# Patient Record
Sex: Male | Born: 1980
Health system: Southern US, Community
[De-identification: ages and names within clinical notes are randomized; demographics above are authoritative.]

## PROBLEM LIST (undated history)

## (undated) DIAGNOSIS — D649 Anemia, unspecified: Secondary | ICD-10-CM

## (undated) DIAGNOSIS — F419 Anxiety disorder, unspecified: Secondary | ICD-10-CM

## (undated) DIAGNOSIS — F32A Depression, unspecified: Secondary | ICD-10-CM

## (undated) DIAGNOSIS — F1020 Alcohol dependence, uncomplicated: Secondary | ICD-10-CM

## (undated) HISTORY — DX: Depression, unspecified: F32.A

## (undated) HISTORY — DX: Anemia, unspecified: D64.9

## (undated) HISTORY — DX: Alcohol dependence, uncomplicated: F10.20

## (undated) HISTORY — DX: Anxiety disorder, unspecified: F41.9

## (undated) HISTORY — PX: OTHER SURGICAL HISTORY: SHX169

---
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
## 2001-01-08 ENCOUNTER — Emergency Department (HOSPITAL_COMMUNITY): Admission: EM | Admit: 2001-01-08 | Discharge: 2001-01-08 | Payer: Self-pay | Admitting: Emergency Medicine

## 2002-11-21 ENCOUNTER — Emergency Department (HOSPITAL_COMMUNITY): Admission: EM | Admit: 2002-11-21 | Discharge: 2002-11-21 | Payer: Self-pay | Admitting: Emergency Medicine

## 2002-11-21 ENCOUNTER — Encounter: Payer: Self-pay | Admitting: Emergency Medicine

## 2004-04-19 ENCOUNTER — Emergency Department (HOSPITAL_COMMUNITY): Admission: EM | Admit: 2004-04-19 | Discharge: 2004-04-19 | Payer: Self-pay | Admitting: Emergency Medicine

## 2004-08-01 ENCOUNTER — Emergency Department (HOSPITAL_COMMUNITY): Admission: EM | Admit: 2004-08-01 | Discharge: 2004-08-01 | Payer: Self-pay | Admitting: Emergency Medicine

## 2004-09-05 ENCOUNTER — Emergency Department (HOSPITAL_COMMUNITY): Admission: EM | Admit: 2004-09-05 | Discharge: 2004-09-05 | Payer: Self-pay | Admitting: Emergency Medicine

## 2005-06-06 ENCOUNTER — Emergency Department (HOSPITAL_COMMUNITY): Admission: EM | Admit: 2005-06-06 | Discharge: 2005-06-06 | Payer: Self-pay | Admitting: Emergency Medicine

## 2005-06-30 ENCOUNTER — Emergency Department (HOSPITAL_COMMUNITY): Admission: EM | Admit: 2005-06-30 | Discharge: 2005-06-30 | Payer: Self-pay | Admitting: Emergency Medicine

## 2006-04-15 ENCOUNTER — Emergency Department (HOSPITAL_COMMUNITY): Admission: EM | Admit: 2006-04-15 | Discharge: 2006-04-15 | Payer: Self-pay | Admitting: Emergency Medicine

## 2006-06-05 ENCOUNTER — Emergency Department (HOSPITAL_COMMUNITY): Admission: EM | Admit: 2006-06-05 | Discharge: 2006-06-06 | Payer: Self-pay | Admitting: Emergency Medicine

## 2006-09-28 ENCOUNTER — Emergency Department (HOSPITAL_COMMUNITY): Admission: EM | Admit: 2006-09-28 | Discharge: 2006-09-28 | Payer: Self-pay | Admitting: Emergency Medicine

## 2007-01-08 ENCOUNTER — Emergency Department (HOSPITAL_COMMUNITY): Admission: EM | Admit: 2007-01-08 | Discharge: 2007-01-08 | Payer: Self-pay | Admitting: Emergency Medicine

## 2008-07-11 ENCOUNTER — Emergency Department (HOSPITAL_COMMUNITY): Admission: EM | Admit: 2008-07-11 | Discharge: 2008-07-11 | Payer: Self-pay | Admitting: Emergency Medicine

## 2008-07-29 ENCOUNTER — Other Ambulatory Visit: Payer: Self-pay | Admitting: Emergency Medicine

## 2008-07-29 ENCOUNTER — Ambulatory Visit: Payer: Self-pay | Admitting: Psychiatry

## 2008-07-29 ENCOUNTER — Inpatient Hospital Stay (HOSPITAL_COMMUNITY): Admission: AD | Admit: 2008-07-29 | Discharge: 2008-08-03 | Payer: Self-pay | Admitting: Psychiatry

## 2009-07-20 ENCOUNTER — Emergency Department (HOSPITAL_COMMUNITY): Admission: EM | Admit: 2009-07-20 | Discharge: 2009-07-20 | Payer: Self-pay | Admitting: Emergency Medicine

## 2009-07-25 ENCOUNTER — Emergency Department (HOSPITAL_COMMUNITY): Admission: EM | Admit: 2009-07-25 | Discharge: 2009-07-26 | Payer: Self-pay | Admitting: Emergency Medicine

## 2009-08-11 ENCOUNTER — Ambulatory Visit (HOSPITAL_COMMUNITY): Admission: RE | Admit: 2009-08-11 | Discharge: 2009-08-11 | Payer: Self-pay | Admitting: Orthopedic Surgery

## 2010-06-16 LAB — URINALYSIS, ROUTINE W REFLEX MICROSCOPIC
Ketones, ur: NEGATIVE mg/dL
Nitrite: NEGATIVE
Protein, ur: 30 mg/dL — AB
Specific Gravity, Urine: 1.024 (ref 1.005–1.030)
Urobilinogen, UA: 0.2 mg/dL (ref 0.0–1.0)
pH: 5.5 (ref 5.0–8.0)

## 2010-06-16 LAB — DIFFERENTIAL
Basophils Relative: 1 % (ref 0–1)
Eosinophils Absolute: 0.2 10*3/uL (ref 0.0–0.7)
Lymphocytes Relative: 22 % (ref 12–46)
Lymphs Abs: 1.1 10*3/uL (ref 0.7–4.0)
Monocytes Relative: 11 % (ref 3–12)

## 2010-06-16 LAB — TRICYCLICS SCREEN, URINE: TCA Scrn: NOT DETECTED

## 2010-06-16 LAB — CBC
Hemoglobin: 15.7 g/dL (ref 13.0–17.0)
MCV: 94.8 fL (ref 78.0–100.0)

## 2010-06-16 LAB — RAPID URINE DRUG SCREEN, HOSP PERFORMED
Amphetamines: NOT DETECTED
Barbiturates: NOT DETECTED
Benzodiazepines: NOT DETECTED
Cocaine: NOT DETECTED
Opiates: NOT DETECTED

## 2010-06-16 LAB — COMPREHENSIVE METABOLIC PANEL
ALT: 40 U/L (ref 0–53)
AST: 46 U/L — ABNORMAL HIGH (ref 0–37)
Albumin: 3.7 g/dL (ref 3.5–5.2)
Alkaline Phosphatase: 59 U/L (ref 39–117)
Calcium: 9.1 mg/dL (ref 8.4–10.5)
GFR calc non Af Amer: >60 mL/min (ref >60)
Glucose, Bld: 95 mg/dL (ref 70–99)
Total Protein: 7 g/dL (ref 6.0–8.3)

## 2010-06-16 LAB — URINE MICROSCOPIC-ADD ON

## 2010-06-16 LAB — ETHANOL: Alcohol, Ethyl (B): 186 mg/dL — ABNORMAL HIGH (ref 0–10)

## 2010-07-21 NOTE — H&P (Signed)
[name], [name]                  ACCOUNT NO.:  [id number]   MEDICAL RECORD NO.:  [id number]          PATIENT TYPE:  IPS   LOCATION:  0502                          [facility]ACILITY[facility]  [facility]   PHYSICIAN:  Anselm Jungling, MD  DATE OF BIRTH:  1981-02-24   DATE OF ADMISSION:  07/29/2008  DATE OF DISCHARGE:                       PSYCHIATRIC ADMISSION ASSESSMENT   A 30 year old male voluntarily admitted on Jul 29, 2008.   HISTORY OF PRESENT ILLNESS:  Patient is here as his parents were  concerned about him having suicidal and homicidal thoughts.  He states  his friend had called his parents because he was acting violently.  He  does state that he gets angry and feels out of control at times.  He  does state that he does have some stress but denied any suicidal  thoughts.  Parents had brought him to the emergency department for  further assessment.  He states he drinks on most days and sometimes it  is to excess.  He denies any other substance use.  He reports his sleep  and appetite have been satisfactory.   PAST PSYCHIATRIC HISTORY:  Patient states he has been here before but no  record was found.  Had seen Dr. Mila Homer approximately 1 month ago and  received a prescription for Prozac and Seroquel.  Patient never filled  the Prozac but has been taking his Seroquel to help him sleep.   SOCIAL HISTORY:  A 30 year old single male who lives in [place], he  lives alone.  He works as an Air traffic controller.  No legal issues.   FAMILY HISTORY:  None.   ALCOHOL AND DRUG HISTORY:  Patient smokes and again has been drinking  most days.  Denies any other substance use.  He denies any seizures,  blackouts, and no DUIs.   PRIMARY CARE Naiomy Watters:  Has none.   MEDICAL PROBLEMS:  Denies any acute or chronic health issues.   MEDICATIONS:  Again, had a prescription for Prozac and Seroquel, never  filled the prescription and has been taking Seroquel to help him sleep.   DRUG ALLERGIES:  NO KNOWN ALLERGIES.   PHYSICAL EXAM:  This is a young male fully assessed in the emergency  department.  Physical exam is reviewed with no significant findings.  He  offers no complaints today and appears in no distress.   LABORATORY DATA:  Shows a CBC within normal limits.  Urinalysis is  negative.  Urine drug screen is negative.  Acetaminophen level less than  10.  CMET within normal limits.  Alcohol level of 186.   MENTAL STATUS EXAM:  Patient is fully alert and cooperative.  He is  casually dressed.  He has fair eye contact.  His speech is clear.  Normal pace and tone.  Patient's mood is depressed and anxious.  He is  pleasant and calm and but does appear to have some mild anxiety.  Thought processes are coherent and goal directed.  No delusional  statements made.  Denies any suicidal or homicidal thoughts.  Cognitive  function, memory is intact.  Partial insight, willing to get some  help  and willing to call family for more information and support.   AXIS I:  1. Mood disorder, NOS.  2. Alcohol abuse, rule out dependence.  AXIS II:  Deferred.  AXIS III:  No known medical conditions.  AXIS IV:  Deferred at this time.  AXIS V:  Current is 35 to 40.   We will have Ativan available p.r.n. for anxiety or withdrawal symptoms.  We will contact parents for information.  We will have Seroquel  available on a p.r.n. basis for agitation and anxiety.  Case manager  will obtain followup with Dr. Mila Homer.   TENTATIVE LENGTH OF STAY:  At this time, is 2 to 3 days.      Landry Corporal, N.P.      Anselm Jungling, MD  Electronically Signed    JO/MEDQ  D:  07/30/2008  T:  07/30/2008  Job:  [id number]
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
## 2010-10-27 ENCOUNTER — Emergency Department (HOSPITAL_COMMUNITY): Payer: Medicaid Other

## 2010-10-27 ENCOUNTER — Emergency Department (HOSPITAL_COMMUNITY)
Admission: EM | Admit: 2010-10-27 | Discharge: 2010-10-27 | Disposition: A | Discharge status: Home / Self Care | Payer: Medicaid Other | Attending: Emergency Medicine | Admitting: Emergency Medicine

## 2010-10-27 DIAGNOSIS — S51809A Unspecified open wound of unspecified forearm, initial encounter: Secondary | ICD-10-CM | POA: Insufficient documentation

## 2010-10-27 DIAGNOSIS — S52309B Unspecified fracture of shaft of unspecified radius, initial encounter for open fracture type I or II: Secondary | ICD-10-CM | POA: Insufficient documentation

## 2010-10-27 DIAGNOSIS — F411 Generalized anxiety disorder: Secondary | ICD-10-CM | POA: Insufficient documentation

## 2010-10-27 DIAGNOSIS — R209 Unspecified disturbances of skin sensation: Secondary | ICD-10-CM | POA: Insufficient documentation

## 2010-10-27 DIAGNOSIS — Y92009 Unspecified place in unspecified non-institutional (private) residence as the place of occurrence of the external cause: Secondary | ICD-10-CM | POA: Insufficient documentation

## 2010-10-27 DIAGNOSIS — M79609 Pain in unspecified limb: Secondary | ICD-10-CM | POA: Insufficient documentation

## 2010-10-27 DIAGNOSIS — R29898 Other symptoms and signs involving the musculoskeletal system: Secondary | ICD-10-CM | POA: Insufficient documentation

## 2010-10-27 DIAGNOSIS — W268XXA Contact with other sharp object(s), not elsewhere classified, initial encounter: Secondary | ICD-10-CM | POA: Insufficient documentation

## 2010-10-27 LAB — CBC
MCHC: 35.8 g/dL (ref 30.0–36.0)
MCV: 88.4 fL (ref 78.0–100.0)
Platelets: 221 10*3/uL (ref 150–400)
RDW: 12.3 % (ref 11.5–15.5)

## 2010-10-27 LAB — DIFFERENTIAL
Basophils Absolute: 0 10*3/uL (ref 0.0–0.1)
Basophils Relative: 0 % (ref 0–1)
Lymphocytes Relative: 27 % (ref 12–46)
Monocytes Relative: 7 % (ref 3–12)
Neutro Abs: 4.4 10*3/uL (ref 1.7–7.7)
Neutrophils Relative %: 63 % (ref 43–77)

## 2010-10-27 LAB — POCT I-STAT, CHEM 8
Glucose, Bld: 92 mg/dL (ref 70–99)
Hemoglobin: 14.6 g/dL (ref 13.0–17.0)
Sodium: 142 mEq/L (ref 135–145)
TCO2: 21 mmol/L (ref 0–100)

## 2010-11-02 NOTE — Op Note (Signed)
NAMEKEITHAN, [name]                  ACCOUNT NO.:  [id number]  MEDICAL RECORD NO.:  [id number]  LOCATION:  [facility]                         FACILITY:  [facility]  PHYSICIAN:  Johnette Abraham, MD    DATE OF BIRTH:  11-09-1980  DATE OF PROCEDURE:  10/27/2010 DATE OF DISCHARGE:                              OPERATIVE REPORT   PREOPERATIVE DIAGNOSIS:  Complex laceration x2 of the right forearm; one on the extensor surface and one on the volar surface.  POSTOPERATIVE DIAGNOSIS:  Complex laceration x2 of the right forearm; one on the extensor surface and one on the volar surface.  PROCEDURES:  Exploration of complex wounds x2; one on the extensor surface and one on the volar surface, repair of extensor tendons, the extensor carpi ulnaris, the extensor digitorum, limited fasciotomies of both the volar forearm compartment and the dorsal forearm compartment, repair of the muscle and fascia of the volar flexor digitorum superficialis and layered wound closure totaling 11 cm.  SURGEON:  Johnette Abraham, MD  ASSISTANT:  None.  ANESTHESIA:  General.  FINDINGS:  Deep laceration more extensive on the extensor surface with laceration of the ECU and extensor digitorum tendon and muscle bellies. There was no obvious nerve injury, laceration through the volar forearm fascia with laceration of the FDS tendinous muscle belly.  ESTIMATED BLOOD LOSS:  25 mL.  INDICATIONS:  Mr. [name] is a 30 year old white male who was presented to the emergency department after shelving his hand through a glass window sustaining a complex laceration or several lacerations to his right upper extremity.  I was consulted for definitive repair.  On my exam, he had limited extension of his fingers.  Grossly, his neurologic exam was intact, although this was hampered by his degree of pain and the patient was inebriated at the time.  Risks, benefits and alternatives of surgery were discussed with the patient.  He agreed to  proceed with surgery. Consent was obtained.  PROCEDURE:  The patient was taken to the operating room and placed supine on the operating room table.  General anesthesia was administered without difficulty.  Time-out was performed.  Preoperative antibiotics had been given in the OR.  After removing the dressing, the wound began bleeding and therefore the arm was elevated and the tourniquet was inflated.  Afterwards, the extremity was prepped and draped in normal sterile fashion.  Exploration of the larger and on the dorsal aspect was performed first.  This laceration was actually quite deep.  After adequate hematoma was removed from wound, the wound was irrigated thoroughly with irrigation solution and evaluated.  The wound had to be enlarged both proximally and distally to gain adequate exposure for exploration.  The muscle bellies what appeared to be the ECU and extensor digitorum were completely lacerated, the bone was exposed.  No nerve injuries were evident.  Afterwards, the wrist was brought into extension approximating the musculofascial components of these muscle bellies.  These muscle bellies and tendons were closed with several interrupted figure-of-eight 3-0 Vicryl sutures.  The fascia was then also approximated with interrupted figure-of-eight 3-0 Vicryl sutures. To prevent a compartment syndrome and to provide some relief of pressure, a  limited fasciotomy was performed separate from the tendon repairs.  Interrupted 3-0 Vicryl was used to secure the subcutaneous layers nicely approximating the wound.  The skin itself was closed with staples.  Next, the volar wound was addressed.  Again, the incision had to be lengthened both proximally and distally to gain adequate exposure. Penetrated the deep forearm fascia, this fascia was opened with limited fasciotomy and the structures were explored.  This wound was through the deep fascia into the flexor digitorum superficialis; however,  did not track deep to the median nerve and ulnar nerves.  After irrigation of this wound, the fascia and muscle belly of the FDS were repaired with interrupted figure-of-eight 3-0 Vicryl.  The skin was closed with interrupted 3-0 Vicryl suture and then the skin was closed with staples. The tourniquet was released.  Hemostasis was achieved.  All fingers were returned to nice pink color.  Approximately 20 mL of 0.25% plain Marcaine was infiltrated circumferentially around the wound for postoperative pain control.  Xeroform dressing, a sterile dressing and a splint holding the wrist in slight extension was placed.  The patient tolerated the procedure well and was taken to the recovery room in stable condition.     Johnette Abraham, MD     HCC/MEDQ  D:  10/27/2010  T:  10/27/2010  Job:  [id number]  Electronically Signed by Knute Neu MD on 11/02/2010 10:12:54 AM

## 2011-07-20 ENCOUNTER — Emergency Department (HOSPITAL_COMMUNITY)
Admission: EM | Admit: 2011-07-20 | Discharge: 2011-07-20 | Disposition: A | Discharge status: Home / Self Care | Payer: Medicaid Other | Attending: Emergency Medicine | Admitting: Emergency Medicine

## 2011-07-20 ENCOUNTER — Encounter (HOSPITAL_COMMUNITY): Payer: Self-pay | Admitting: *Deleted

## 2011-07-20 DIAGNOSIS — S51809A Unspecified open wound of unspecified forearm, initial encounter: Secondary | ICD-10-CM | POA: Insufficient documentation

## 2011-07-20 DIAGNOSIS — W261XXA Contact with sword or dagger, initial encounter: Secondary | ICD-10-CM | POA: Insufficient documentation

## 2011-07-20 DIAGNOSIS — W260XXA Contact with knife, initial encounter: Secondary | ICD-10-CM | POA: Insufficient documentation

## 2011-07-20 DIAGNOSIS — F17200 Nicotine dependence, unspecified, uncomplicated: Secondary | ICD-10-CM | POA: Insufficient documentation

## 2011-07-20 DIAGNOSIS — S51811A Laceration without foreign body of right forearm, initial encounter: Secondary | ICD-10-CM

## 2011-07-20 MED ORDER — CEPHALEXIN 500 MG PO CAPS: 500.0000 mg | ORAL_CAPSULE | Freq: Four times a day (QID) | ORAL | Status: AC

## 2011-07-20 MED ORDER — IBUPROFEN 800 MG PO TABS: 800.0000 mg | ORAL_TABLET | Freq: Three times a day (TID) | ORAL | Status: AC | PRN

## 2011-07-20 NOTE — Discharge Instructions (Signed)
Laceration Care, Adult °A laceration is a cut that goes through all layers of the skin. The cut goes into the tissue beneath the skin. °HOME CARE °For stitches (sutures) or staples: °· Keep the cut clean and dry.  °· If you have a bandage (dressing), change it at least once a day. Change the bandage if it gets wet or dirty, or as told by your doctor.  °· Wash the cut with soap and water 2 times a day. Rinse the cut with water. Pat it dry with a clean towel.  °· Put a thin layer of medicated cream on the cut as told by your doctor.  °· You may shower after the first 24 hours. Do not soak the cut in water until the stitches are removed.  °· Only take medicines as told by your doctor.  °· Have your stitches or staples removed as told by your doctor.  °For skin adhesive strips: °· Keep the cut clean and dry.  °· Do not get the strips wet. You may take a bath, but be careful to keep the cut dry.  °· If the cut gets wet, pat it dry with a clean towel.  °· The strips will fall off on their own. Do not remove the strips that are still stuck to the cut.  °For wound glue: °· You may shower or take baths. Do not soak or scrub the cut. Do not swim. Avoid heavy sweating until the glue falls off on its own. After a shower or bath, pat the cut dry with a clean towel.  °· Do not put medicine on your cut until the glue falls off.  °· If you have a bandage, do not put tape over the glue.  °· Avoid lots of sunlight or tanning lamps until the glue falls off. Put sunscreen on the cut for the first year to reduce your scar.  °· The glue will fall off on its own. Do not pick at the glue.  °You may need a tetanus shot if: °· You cannot remember when you had your last tetanus shot.  °· You have never had a tetanus shot.  °If you need a tetanus shot and you choose not to have one, you may get tetanus. Sickness from tetanus can be serious. °GET HELP RIGHT AWAY IF:  °· Your pain does not get better with medicine.  °· Your arm, hand, leg, or  foot loses feeling (numbness) or changes color.  °· Your cut is bleeding.  °· Your joint feels weak, or you cannot use your joint.  °· You have painful lumps on your body.  °· Your cut is red, puffy (swollen), or painful.  °· You have a red line on the skin near the cut.  °· You have yellowish-white fluid (pus) coming from the cut.  °· You have a fever.  °· You have a bad smell coming from the cut or bandage.  °· Your cut breaks open before or after stitches are removed.  °· You notice something coming out of the cut, such as wood or glass.  °· You cannot move a finger or toe.  °MAKE SURE YOU:  °· Understand these instructions.  °· Will watch your condition.  °· Will get help right away if you are not doing well or get worse.  °Document Released: 08/11/2007 Document Revised: 02/11/2011 Document Reviewed: 08/18/2010 °[employer]® Patient Information ©2012 [employer]. °

## 2011-07-20 NOTE — ED Provider Notes (Signed)
Medical screening examination/treatment/procedure(s) were performed by non-physician practitioner and as supervising physician I was immediately available for consultation/collaboration.   Celene Kras, MD 07/20/11 [phone]

## 2011-07-20 NOTE — ED Notes (Signed)
Pt reports laceration to R lateral forearm last night by "filet knife." <1cm superficial lac, bleeding controlled. Last tetanus <6yrs ago.
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
## 2011-07-20 NOTE — ED Provider Notes (Signed)
History     CSN: [id number]  Arrival date & time 07/20/11  1311   First MD Initiated Contact with Patient 07/20/11 1508      Chief Complaint  Patient presents with  . Extremity Laceration    (Consider location/radiation/quality/duration/timing/severity/associated sxs/prior treatment) HPI  31 year-old male presents for evaluation of laceration.  Pt reports laceration to R forearm when accidentally cut with a "filet knife".  Sts he was washing dishes and left the filet knife pointing upward.  He accidentally jab R forearm into knife.  He proceeds to clean wound and went to sleep.  He is here for evaluation "just to be safe".  Denies numbness, pain with finger movement, or any other injury.  UTD with tetanus.    History reviewed. No pertinent past medical history.  Past Surgical History  Procedure Date  . Arm tendon surgery     No family history on file.  History  Substance Use Topics  . Smoking status: Current Everyday Smoker  . Smokeless tobacco: Not on file  . Alcohol Use: Yes     occasionally      Review of Systems  Constitutional: Negative for fever.  Skin: Negative for rash.  Neurological: Negative for numbness.    Allergies  Review of patient's allergies indicates no known allergies.  Home Medications   Current Outpatient Rx  Name Route Sig Dispense Refill  . ADULT MULTIVITAMIN W/MINERALS CH Oral Take 1 tablet by mouth daily.      BP 118/74  Pulse 84  Temp 98.4 F (36.9 C)  Resp 20  SpO2 96%  Physical Exam  Constitutional: He is oriented to person, place, and time. He appears well-developed and well-nourished. No distress.  HENT:  Head: Normocephalic.  Eyes: Conjunctivae are normal.  Neck: Neck supple.  Musculoskeletal: Normal range of motion.  Neurological: He is alert and oriented to person, place, and time.  Skin: Skin is warm. No rash noted.       ED Course  Procedures (including critical care time)  Labs Reviewed - No data to  display No results found.   No diagnosis found.    MDM  1cm laceration to R lateral forearm that has scabbed.  Wound duration >6 hrs.  No fb seen or palpated.  Will prophylactically provide abx to decreased risk of infection.  Strict f/u precaution given.  Pt voice understanding.  No neurovascular involvement noted on exam.          Fayrene Helper, PA-C 07/20/11 1521

## 2012-04-21 IMAGING — CR DG FOREARM 2V*R*
2 series · 2 of 2 positions shown · non-contrast
Comparison: Right hand radiographs performed 07/11/2008

CLINICAL DATA: Laceration to right forearm from glass door; right
forearm pain.

RIGHT FOREARM - 2 VIEW

[x forearm ap right]
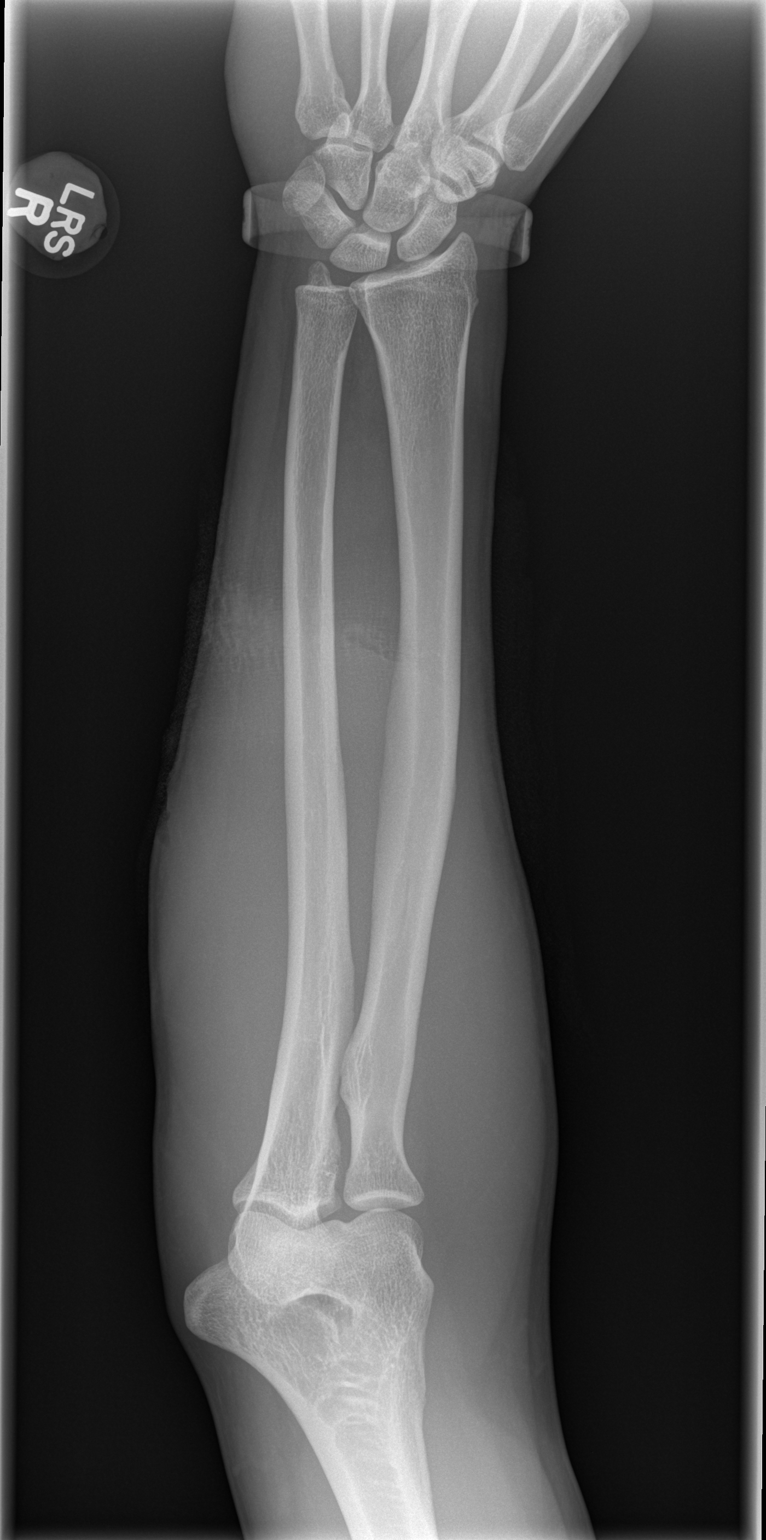

[x forearm lat right]
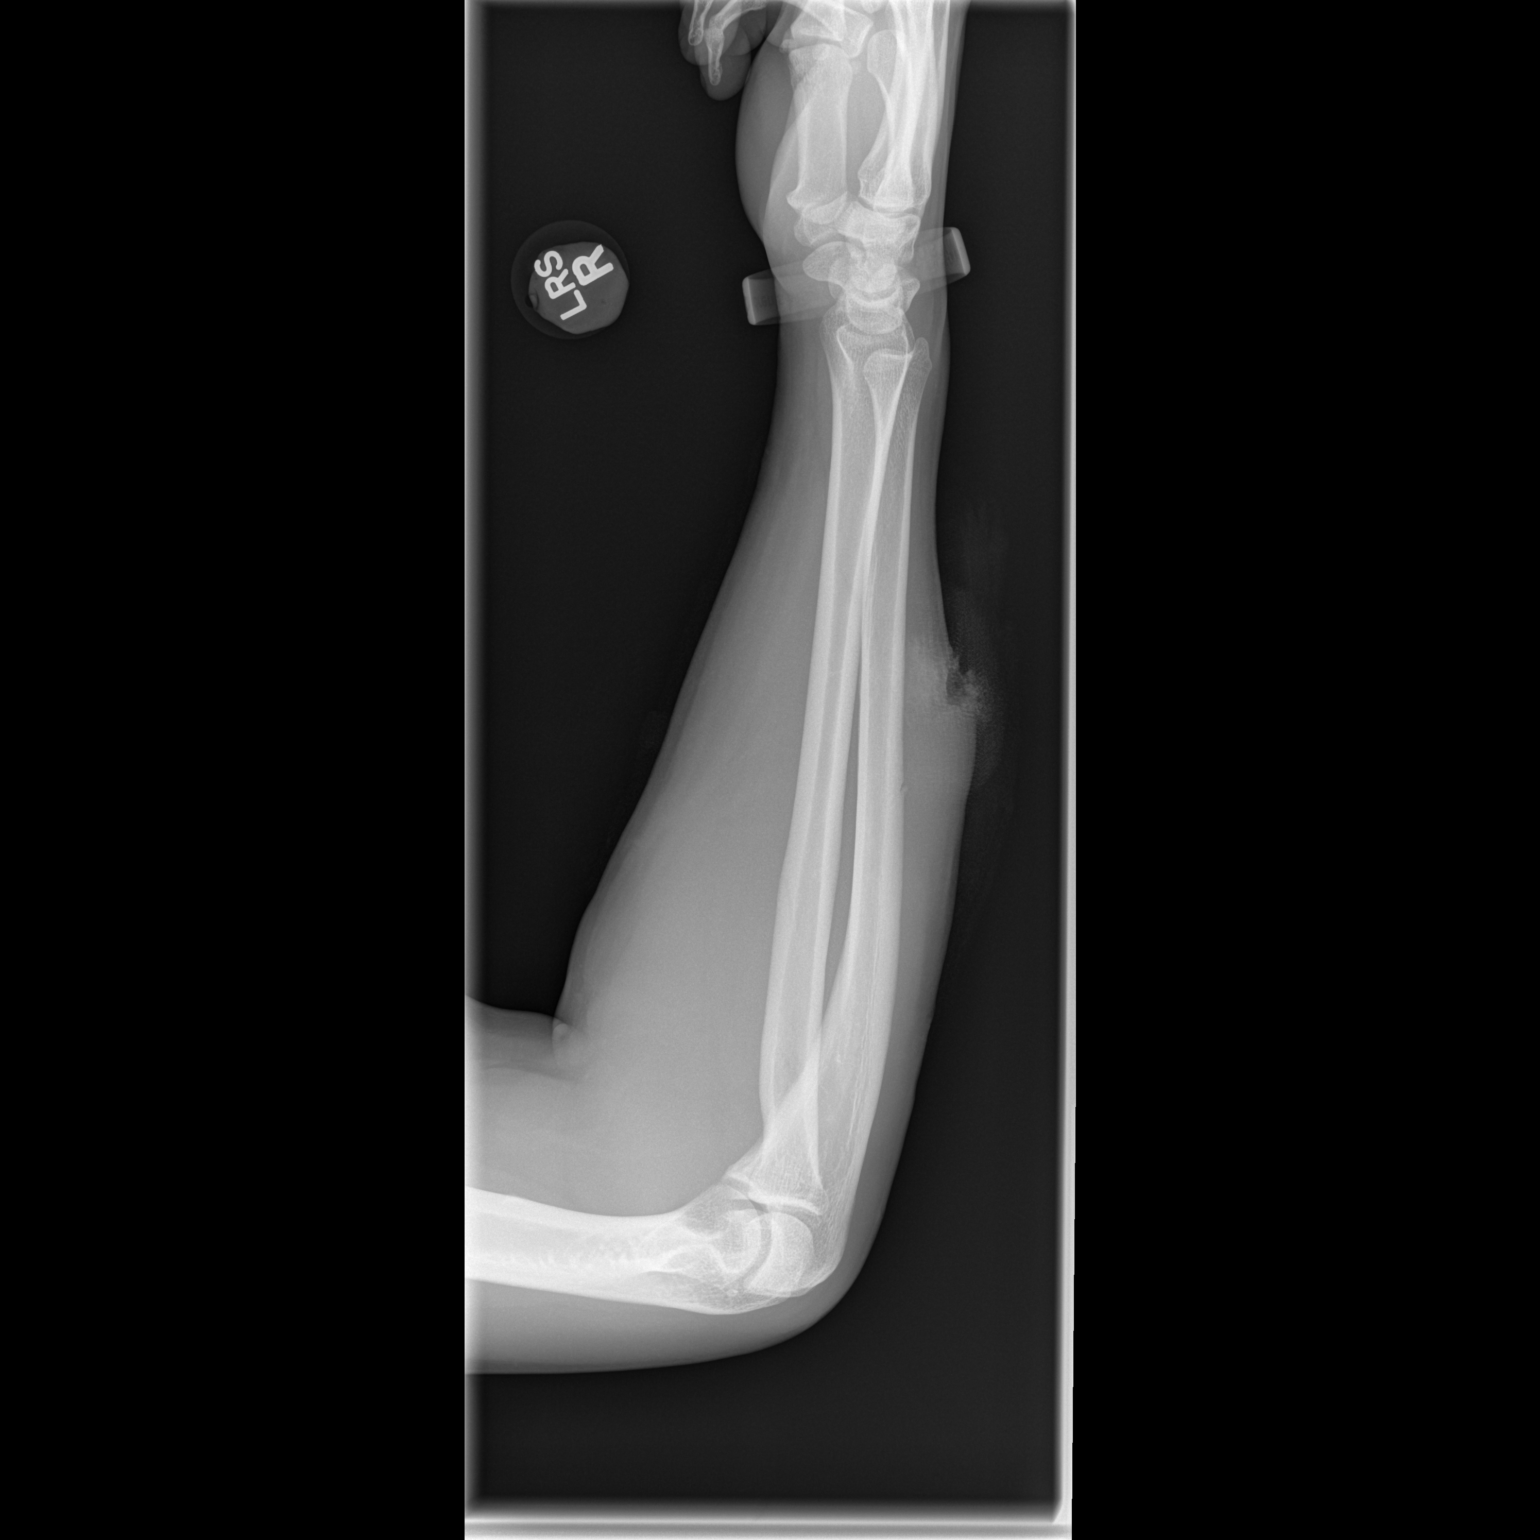

[2 of 2 positions shown; findings below may reference images not displayed]

FINDINGS: There is a tiny superficial fracture involving the dorsal
cortex of the mid radius.  Overlying soft tissue disruption and
swelling are noted.  No additional fractures are seen.

The elbow joint is incompletely assessed, but appears grossly
unremarkable.  No elbow joint is effusion is seen.  The carpal rows
demonstrate normal alignment, and appear intact.  Visualized joint
spaces are preserved.
IMPRESSION: Tiny superficial fracture involving only the dorsal cortex of the
mid radius; overlying soft tissue disruption and swelling noted.

## 2015-08-01 ENCOUNTER — Emergency Department (HOSPITAL_COMMUNITY)
Admission: EM | Admit: 2015-08-01 | Discharge: 2015-08-01 | Disposition: A | Discharge status: Home / Self Care | Payer: Medicare Other | Attending: Emergency Medicine | Admitting: Emergency Medicine

## 2015-08-01 ENCOUNTER — Emergency Department (HOSPITAL_COMMUNITY): Payer: Medicare Other

## 2015-08-01 ENCOUNTER — Encounter (HOSPITAL_COMMUNITY): Payer: Self-pay | Admitting: Emergency Medicine

## 2015-08-01 DIAGNOSIS — W01198A Fall on same level from slipping, tripping and stumbling with subsequent striking against other object, initial encounter: Secondary | ICD-10-CM | POA: Insufficient documentation

## 2015-08-01 DIAGNOSIS — F17200 Nicotine dependence, unspecified, uncomplicated: Secondary | ICD-10-CM | POA: Insufficient documentation

## 2015-08-01 DIAGNOSIS — Y998 Other external cause status: Secondary | ICD-10-CM | POA: Insufficient documentation

## 2015-08-01 DIAGNOSIS — S61412A Laceration without foreign body of left hand, initial encounter: Secondary | ICD-10-CM | POA: Insufficient documentation

## 2015-08-01 DIAGNOSIS — Z79899 Other long term (current) drug therapy: Secondary | ICD-10-CM | POA: Diagnosis not present

## 2015-08-01 DIAGNOSIS — Y92009 Unspecified place in unspecified non-institutional (private) residence as the place of occurrence of the external cause: Secondary | ICD-10-CM | POA: Insufficient documentation

## 2015-08-01 DIAGNOSIS — IMO0002 Reserved for concepts with insufficient information to code with codable children: Secondary | ICD-10-CM

## 2015-08-01 DIAGNOSIS — Y9389 Activity, other specified: Secondary | ICD-10-CM | POA: Insufficient documentation

## 2015-08-01 MED ORDER — LIDOCAINE HCL 2 % IJ SOLN
15.0000 mL | Freq: Once | INTRAMUSCULAR | Status: DC
  Filled 2015-08-01: qty 20

## 2015-08-01 MED ORDER — LIDOCAINE HCL (PF) 1 % IJ SOLN
INTRAMUSCULAR | Status: AC
  Filled 2015-08-01: qty 30

## 2015-08-01 MED ORDER — LIDOCAINE-EPINEPHRINE-TETRACAINE (LET) SOLUTION
3.0000 mL | Freq: Once | NASAL | Status: AC
  Administered 2015-08-01: 3 mL via TOPICAL
  Filled 2015-08-01: qty 3

## 2015-08-01 MED ORDER — CEPHALEXIN 250 MG PO CAPS
500.0000 mg | ORAL_CAPSULE | Freq: Once | ORAL | Status: AC
  Administered 2015-08-01: 500 mg via ORAL
  Filled 2015-08-01: qty 2

## 2015-08-01 MED ORDER — CEPHALEXIN 500 MG PO CAPS: 500.0000 mg | ORAL_CAPSULE | Freq: Four times a day (QID) | ORAL | Status: DC

## 2015-08-01 NOTE — ED Notes (Signed)
Hand has been sutured and bandaged by pa.

## 2015-08-01 NOTE — ED Provider Notes (Signed)
CSN: [id number]     Arrival date & time 08/01/15  0038 History  By signing my name below, I, [facility]arr[name], attest that this documentation has been prepared under the direction and in the presence of Bradley Matasiffany G Ahijah Devery, PA-C. Electronically Signed: Randell PatientMarrissa [name], ED Scribe. 08/01/2015. 2:44 AM.   Chief Complaint  [name] presents with  . Laceration   The history is provided by the [name]. No language interpreter was used.   HPI Comments: [name] is a [age] y.o. male brought in by EMS who presents to the Emergency Department complaining of mildly painful, bleeding controlled, left hand laceration to his palm that occurred 1.5 hours ago. Pt states that he tripped over his dog and stuck his hand in between his couch cushions where his hand was lacerated by an unknown object. He reports numbness in his left fifth finger. He had the area cleaned and wrapped by EMS and has had LET applied in the ED with relief of his pain. Tetanus UTD. Denies any other symptoms.  History reviewed. No pertinent past medical history. Past Surgical History  Procedure Laterality Date  . Arm tendon surgery     No family history on file. Social History  Substance Use Topics  . Smoking status: Current Every Day Smoker  . Smokeless tobacco: None  . Alcohol Use: Yes     Comment: occasionally    Review of Systems  Skin: Positive for wound.  Neurological: Positive for numbness.  All other systems reviewed and are negative.   Allergies  Review of [name]'s allergies indicates no known allergies.  Home Medications   Prior to Admission medications   Medication Sig Start Date End Date Taking? Authorizing Provider  Multiple Vitamin (MULITIVITAMIN WITH MINERALS) TABS Take 1 tablet by mouth daily.   Yes Historical Provider, MD  cephALEXin (KEFLEX) 500 MG capsule Take 1 capsule (500 mg total) by mouth 4 (four) times daily. 08/01/15   Bradley Dezarn Neva SeatGreene, PA-C   BP 113/66 mmHg  Pulse 80  Temp(Src) 97.6 F  (36.4 C) (Oral)  Resp 17  SpO2 99% Physical Exam  Constitutional: He is oriented to person, place, and time. He appears well-developed and well-nourished. No distress.  HENT:  Head: Normocephalic and atraumatic.  Eyes: Conjunctivae are normal.  Neck: Normal range of motion.  Cardiovascular: Normal rate.   Pulmonary/Chest: Effort normal. No respiratory distress.  Musculoskeletal: Normal range of motion.  Neurological: He is alert and oriented to person, place, and time.  Skin: Skin is warm and dry. Laceration (1.5 inch laceration to left lateral palm. pt has decreased sensation. pulses and ROM against resistance and passively are intact.) noted.  Wound contaminated with dog and cat hair.  Psychiatric: He has a normal mood and affect. His behavior is normal.  Nursing note and vitals reviewed.   ED Course  Procedures (including critical care time)  DIAGNOSTIC STUDIES: Oxygen Saturation is 100% on RA, normal by my interpretation.    COORDINATION OF CARE: 2:38 AM Will perform laceration repair. Discussed treatment plan with pt at bedside and pt agreed to plan.   Labs Review Labs Reviewed - No data to display  Imaging Review Dg Hand Complete Left  08/01/2015  CLINICAL DATA:  Status post fall, with left hand lacerations. Cut hand on unknown object in couch. Initial encounter. EXAM: LEFT HAND - COMPLETE 3+ VIEW COMPARISON:  Left hand radiographs performed 01/08/2007 FINDINGS: There is no evidence of fracture or dislocation. The joint spaces are preserved. The carpal rows are intact,  and demonstrate normal alignment. A soft tissue laceration is noted overlying the distal fifth metacarpal. No radiopaque foreign bodies are seen. IMPRESSION: No evidence of fracture or dislocation. No radiopaque foreign bodies seen. Electronically Signed   By: Roanna Raider M.D.   On: 08/01/2015 01:29   I have personally reviewed and evaluated these images and lab results as part of my medical  decision-making.   EKG Interpretation None      MDM   Final diagnoses:  Laceration    Wound very dirty and had to be thoroughly cleaned. Many strands of cat and dog hair removed from wound, [name] started on keflex.   LACERATION REPAIR Performed by: [name] Authorized by: [name] Consent: Verbal consent obtained. Risks and benefits: risks, benefits and alternatives were discussed Consent given by: [name] [name] identity confirmed: provided demographic data Prepped and Draped in normal sterile fashion Wound explored  Laceration Location: left palm  Laceration Length: 1.5 inch No Foreign Bodies seen or palpated  Anesthesia: local infiltration and LET  Local anesthetic: lidocaine 2% wo epinephrine  Anesthetic total: 2 ml  Irrigation method: syringe Amount of cleaning: standard  Skin closure: sutures, 4'0 Ethilon   Number of sutures: 6  Technique: simple interrupted  [name] tolerance: [name] tolerated the procedure well with no immediate complications.  Tetanus UTD. Laceration occurred < 12 hours prior to repair. Discussed laceration care with pt and answered questions. Pt to f-u for suture removal in 14 days and wound check sooner should there be signs of dehiscence or infection. Pt is hemodynamically stable with no complaints prior to dc.     I personally performed the services described in this documentation, which was scribed in my presence. The recorded information has been reviewed and is accurate.   Marlon Pel, PA-C 08/07/15 2302  Lavera Guise, MD 08/09/15 [phone]

## 2015-08-01 NOTE — Discharge Instructions (Signed)

## 2015-08-01 NOTE — ED Notes (Signed)
Pt. tripped on his dog and almost fell at home , hit his hand against the couch and sustained a laceration approx. 1 " at left hand with bleeding , dressing applied by EMS prior to arrival /bleeding controlled.

## 2016-01-14 MED FILL — DIVALPROEX SOD ER 500 MG TA: 500 | 30 days supply | Qty: 60 | Fill #0

## 2016-01-14 MED FILL — QUETIAPINE 300 MG TABLET: 300 | 30 days supply | Qty: 45 | Fill #0

## 2016-02-18 MED FILL — DIVALPROEX SOD ER 500 MG TA: 500 | 3 days supply | Qty: 60 | Fill #0

## 2016-02-18 MED FILL — QUETIAPINE FUMARATE 300 MG: 300 | 30 days supply | Qty: 45 | Fill #0

## 2016-03-25 MED FILL — SERTRALINE HCL 25 MG TABLET: 25 | 30 days supply | Qty: 30 | Fill #0

## 2016-03-25 MED FILL — DIVALPROEX SOD ER 500 MG TA: 500 | 30 days supply | Qty: 60 | Fill #1

## 2016-03-25 MED FILL — QUETIAPINE FUMARATE 300 MG: 300 | 30 days supply | Qty: 60 | Fill #0

## 2017-01-24 IMAGING — DX DG HAND COMPLETE 3+V*L*
3 series · 3 of 3 positions shown · non-contrast
Comparison: Left hand radiographs performed 01/08/2007

CLINICAL DATA: Status post fall, with left hand lacerations. Cut
hand on unknown object in couch. Initial encounter.

EXAM:
LEFT HAND - COMPLETE 3+ VIEW

[hand pa]
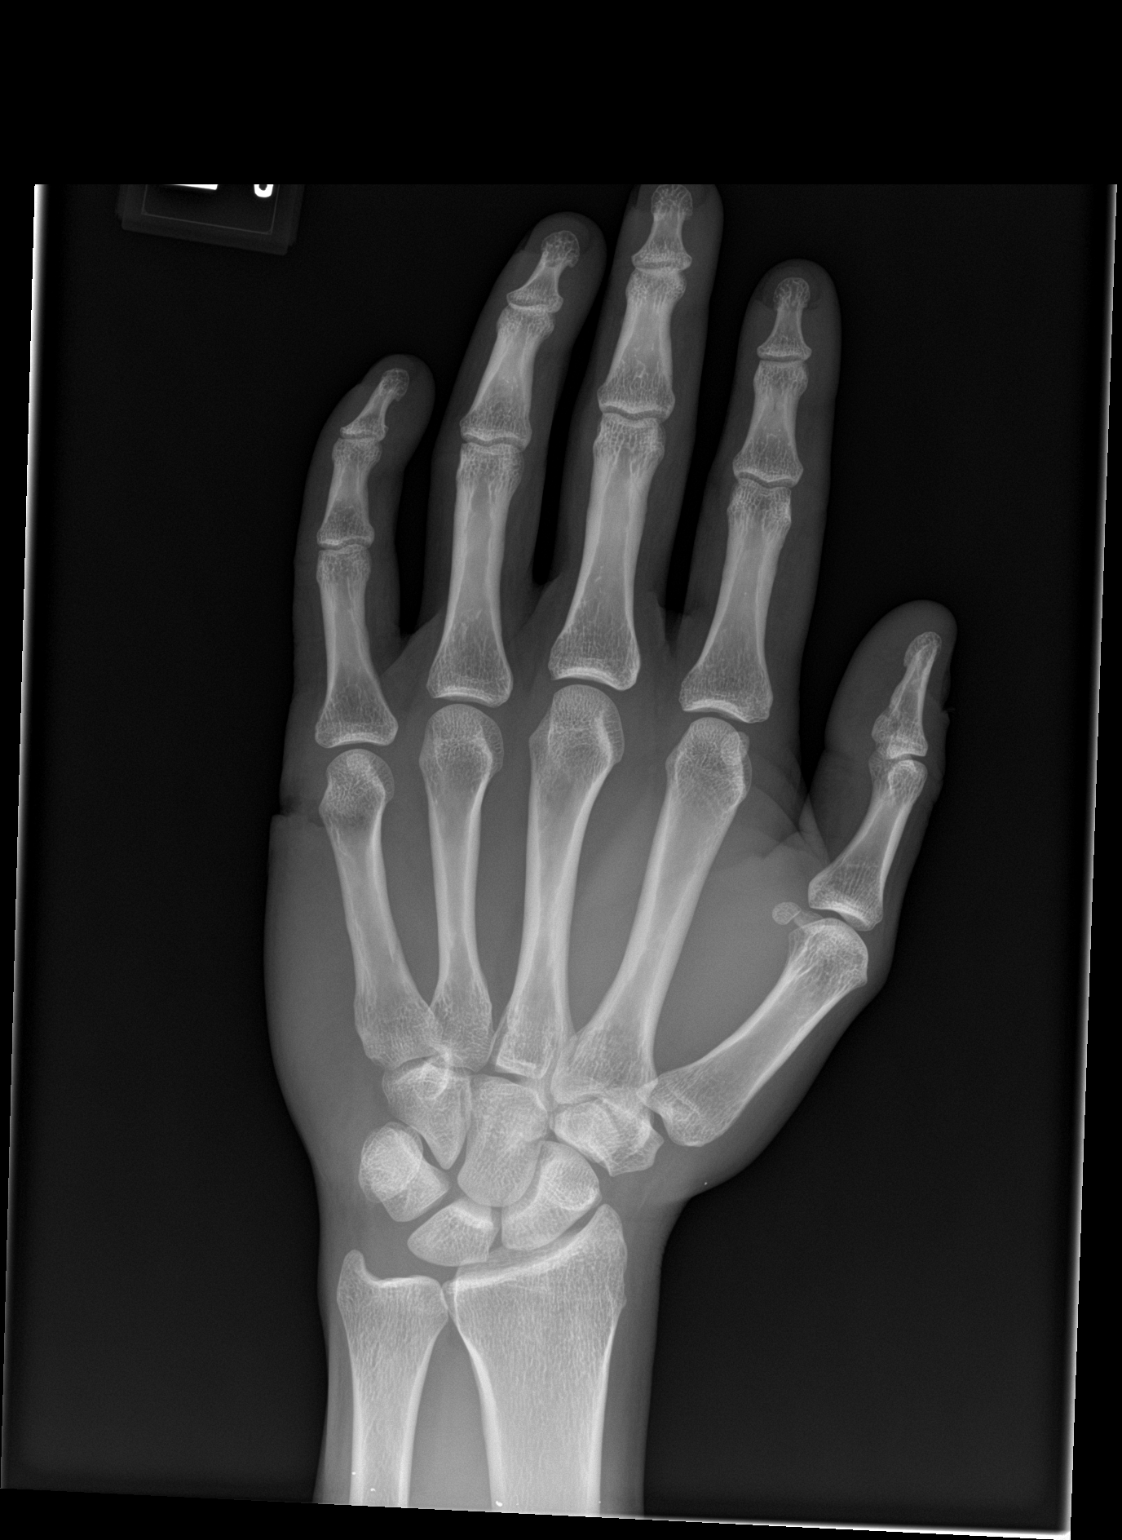

[hand obl]
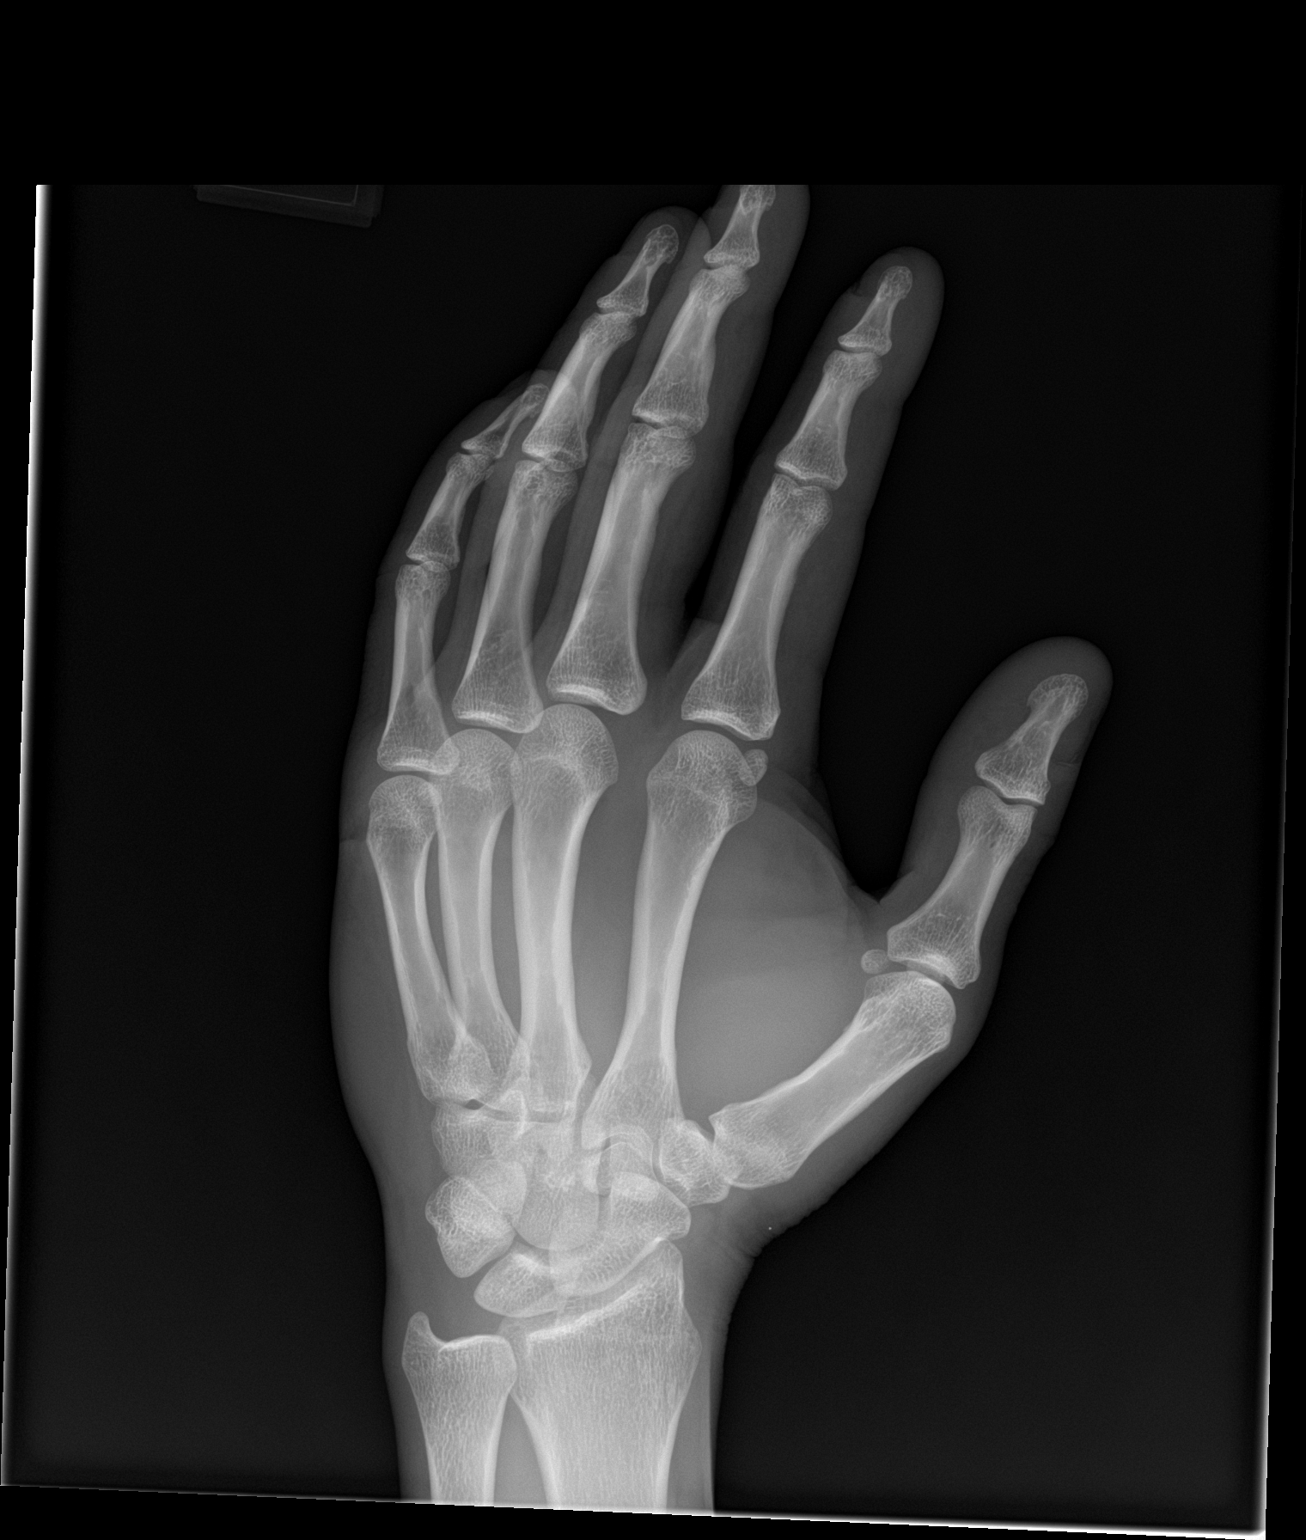

[hand lat]
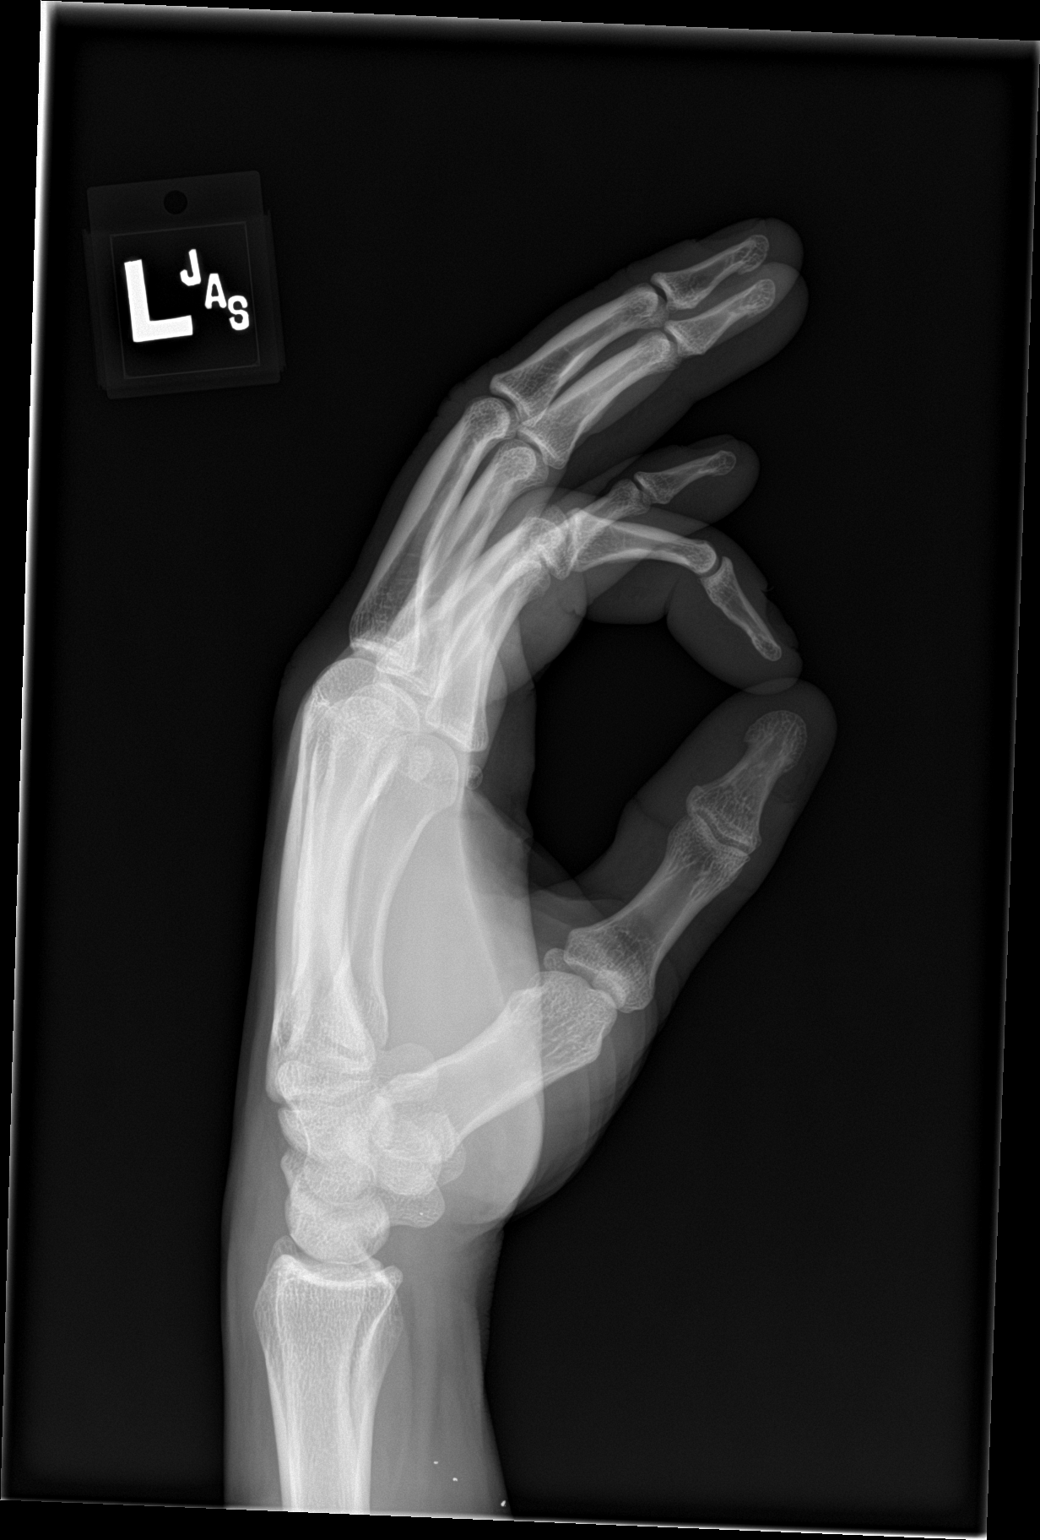

[3 of 3 positions shown; findings below may reference images not displayed]

FINDINGS: There is no evidence of fracture or dislocation. The joint spaces
are preserved. The carpal rows are intact, and demonstrate normal
alignment. A soft tissue laceration is noted overlying the distal
fifth metacarpal. No radiopaque foreign bodies are seen.
IMPRESSION: No evidence of fracture or dislocation. No radiopaque foreign bodies
seen.

## 2017-01-25 ENCOUNTER — Emergency Department (HOSPITAL_COMMUNITY)
Admission: EM | Admit: 2017-01-25 | Discharge: 2017-01-26 | Disposition: A | Discharge status: Other Acute Inpatient Hospital | Payer: Medicare Other | Attending: Emergency Medicine | Admitting: Emergency Medicine

## 2017-01-25 ENCOUNTER — Encounter (HOSPITAL_COMMUNITY): Payer: Self-pay | Admitting: Emergency Medicine

## 2017-01-25 DIAGNOSIS — F10920 Alcohol use, unspecified with intoxication, uncomplicated: Secondary | ICD-10-CM

## 2017-01-25 DIAGNOSIS — F1014 Alcohol abuse with alcohol-induced mood disorder: Secondary | ICD-10-CM | POA: Diagnosis present

## 2017-01-25 DIAGNOSIS — R45851 Suicidal ideations: Secondary | ICD-10-CM | POA: Diagnosis not present

## 2017-01-25 DIAGNOSIS — F6381 Intermittent explosive disorder: Secondary | ICD-10-CM | POA: Diagnosis not present

## 2017-01-25 DIAGNOSIS — F17200 Nicotine dependence, unspecified, uncomplicated: Secondary | ICD-10-CM | POA: Diagnosis not present

## 2017-01-25 DIAGNOSIS — Z79899 Other long term (current) drug therapy: Secondary | ICD-10-CM | POA: Diagnosis not present

## 2017-01-25 DIAGNOSIS — F17210 Nicotine dependence, cigarettes, uncomplicated: Secondary | ICD-10-CM | POA: Diagnosis not present

## 2017-01-25 DIAGNOSIS — Z811 Family history of alcohol abuse and dependence: Secondary | ICD-10-CM | POA: Diagnosis not present

## 2017-01-25 LAB — CBC
HCT: 43.7 % (ref 39.0–52.0)
Hemoglobin: 15.3 g/dL (ref 13.0–17.0)
MCH: 32.9 pg (ref 26.0–34.0)
MCHC: 35 g/dL (ref 30.0–36.0)
MCV: 94 fL (ref 78.0–100.0)
Platelets: 308 10*3/uL (ref 150–400)
RBC: 4.65 MIL/uL (ref 4.22–5.81)
RDW: 13 % (ref 11.5–15.5)
WBC: 8.8 10*3/uL (ref 4.0–10.5)

## 2017-01-25 LAB — COMPREHENSIVE METABOLIC PANEL
ALT: 73 U/L — ABNORMAL HIGH (ref 17–63)
AST: 84 U/L — ABNORMAL HIGH (ref 15–41)
Albumin: 4.4 g/dL (ref 3.5–5.0)
Alkaline Phosphatase: 48 U/L (ref 38–126)
Anion gap: 12 (ref 5–15)
BILIRUBIN TOTAL: 0.5 mg/dL (ref 0.3–1.2)
BUN: 8 mg/dL (ref 6–20)
CHLORIDE: 106 mmol/L (ref 101–111)
CO2: 20 mmol/L — ABNORMAL LOW (ref 22–32)
Calcium: 8.9 mg/dL (ref 8.9–10.3)
Creatinine, Ser: 0.75 mg/dL (ref 0.61–1.24)
GFR calc non Af Amer: >60 mL/min (ref >60)
Glucose, Bld: 91 mg/dL (ref 65–99)
POTASSIUM: 3.7 mmol/L (ref 3.5–5.1)
Sodium: 138 mmol/L (ref 135–145)
TOTAL PROTEIN: 7.7 g/dL (ref 6.5–8.1)

## 2017-01-25 LAB — ETHANOL: ALCOHOL ETHYL (B): 281 mg/dL — AB (ref <10)

## 2017-01-25 LAB — SALICYLATE LEVEL: Salicylate Lvl: <7 mg/dL (ref 2.8–30.0)

## 2017-01-25 LAB — ACETAMINOPHEN LEVEL

## 2017-01-25 MED ORDER — ALUM & MAG HYDROXIDE-SIMETH 200-200-20 MG/5ML PO SUSP: 30.0000 mL | Freq: Four times a day (QID) | ORAL | Status: DC | PRN

## 2017-01-25 MED ORDER — IBUPROFEN 200 MG PO TABS: 600.0000 mg | ORAL_TABLET | Freq: Three times a day (TID) | ORAL | Status: DC | PRN

## 2017-01-25 MED ORDER — LORAZEPAM 1 MG PO TABS: 0.0000 mg | ORAL_TABLET | Freq: Two times a day (BID) | ORAL | Status: DC

## 2017-01-25 MED ORDER — VITAMIN B-1 100 MG PO TABS: 100.0000 mg | ORAL_TABLET | Freq: Every day | ORAL | Status: DC

## 2017-01-25 MED ORDER — ONDANSETRON HCL 4 MG PO TABS: 4.0000 mg | ORAL_TABLET | Freq: Three times a day (TID) | ORAL | Status: DC | PRN

## 2017-01-25 MED ORDER — THIAMINE HCL 100 MG/ML IJ SOLN: 100.0000 mg | Freq: Every day | INTRAMUSCULAR | Status: DC

## 2017-01-25 MED ORDER — LORAZEPAM 2 MG/ML IJ SOLN: 0.0000 mg | Freq: Two times a day (BID) | INTRAMUSCULAR | Status: DC

## 2017-01-25 MED ORDER — LORAZEPAM 1 MG PO TABS: 0.0000 mg | ORAL_TABLET | Freq: Four times a day (QID) | ORAL | Status: DC

## 2017-01-25 MED ORDER — LORAZEPAM 2 MG/ML IJ SOLN: 0.0000 mg | Freq: Four times a day (QID) | INTRAMUSCULAR | Status: DC

## 2017-01-25 MED ORDER — NICOTINE 21 MG/24HR TD PT24
21.0000 mg | MEDICATED_PATCH | Freq: Every day | TRANSDERMAL | Status: DC
  Administered 2017-01-26: 21 mg via TRANSDERMAL
  Filled 2017-01-25: qty 1

## 2017-01-25 NOTE — ED Notes (Signed)
Patient changed into paper scrubs. 

## 2017-01-25 NOTE — ED Provider Notes (Signed)
[facility]-EMERGENCY DEPT Provider Note   CSN: [id number] Arrival date & time: 01/25/17  2136     History   Chief Complaint Chief Complaint  Patient presents with  . Suicidal    HPI Blima DessertBrian A Castile is a 36 y.o. male.  The history is provided by the patient. No language interpreter was used.    Blima DessertBrian A Cinquemani is a 36 y.o. male who presents to the Emergency Department complaining of psychiatric evaluation.  He presents to the emergency department under IVC.  He states that he was arguing with his girlfriend and he told her to leave.  He does state that he has been drinking alcohol today but he drinks most days.  He does endorse frequent passive suicidal thoughts but does not have any plan.  According to IVC papers the patient took out a gun.  Patient denies taking out a gun but he does state that he has access to guns.  He does not take any medications and has no medical problems.  He denies any drug use.  History reviewed. No pertinent past medical history.  There are no active problems to display for this patient.   Past Surgical History:  Procedure Laterality Date  . arm tendon surgery         Home Medications    Prior to Admission medications   Medication Sig Start Date End Date Taking? Authorizing Provider  ibuprofen (ADVIL,MOTRIN) 200 MG tablet Take 400 mg by mouth every 4 (four) hours as needed for headache.   Yes [provider]    Family History No family history on file.  Social History Social History   Tobacco Use  . Smoking status: Current Every Day Smoker  Substance Use Topics  . Alcohol use: Yes    Comment: occasionally  . Drug use: No     Allergies   Patient has no known allergies.   Review of Systems Review of Systems  All other systems reviewed and are negative.    Physical Exam Updated Vital Signs BP 129/77 (BP Location: Right Arm)   Pulse (!) 103   Temp 98.3 F (36.8 C) (Oral)   Resp 16   SpO2 96%    Physical Exam  Constitutional: He is oriented to person, place, and time. He appears well-developed and well-nourished.  HENT:  Head: Normocephalic and atraumatic.  Cardiovascular: Normal rate and regular rhythm.  Pulmonary/Chest: Effort normal. No respiratory distress.  Musculoskeletal: Normal range of motion. He exhibits no edema or tenderness.  Neurological: He is alert and oriented to person, place, and time.  Skin: Skin is warm and dry.  Psychiatric: He has a normal mood and affect. His behavior is normal.  Nursing note and vitals reviewed.    ED Treatments / Results  Labs (all labs ordered are listed, but only abnormal results are displayed) Labs Reviewed  COMPREHENSIVE METABOLIC PANEL - Abnormal; Notable for the following components:      Result Value   CO2 20 (*)    AST 84 (*)    ALT 73 (*)    All other components within normal limits  ETHANOL - Abnormal; Notable for the following components:   Alcohol, Ethyl (B) 281 (*)    All other components within normal limits  ACETAMINOPHEN LEVEL - Abnormal; Notable for the following components:   Acetaminophen (Tylenol), Serum <10 (*)    All other components within normal limits  SALICYLATE LEVEL  CBC  RAPID URINE DRUG SCREEN, HOSP PERFORMED  EKG  EKG Interpretation None       Radiology No results found.  Procedures Procedures (including critical care time)  Medications Ordered in ED Medications  LORazepam (ATIVAN) injection 0-4 mg (not administered)    Or  LORazepam (ATIVAN) tablet 0-4 mg (not administered)  LORazepam (ATIVAN) injection 0-4 mg (not administered)    Or  LORazepam (ATIVAN) tablet 0-4 mg (not administered)  thiamine (VITAMIN B-1) tablet 100 mg (not administered)    Or  thiamine (B-1) injection 100 mg (not administered)  ibuprofen (ADVIL,MOTRIN) tablet 600 mg (not administered)  ondansetron (ZOFRAN) tablet 4 mg (not administered)  alum & mag hydroxide-simeth (MAALOX/MYLANTA) 200-200-20  MG/5ML suspension 30 mL (not administered)  nicotine (NICODERM CQ - dosed in mg/24 hours) patch 21 mg (not administered)     Initial Impression / Assessment and Plan / ED Course  I have reviewed the triage vital signs and the nursing notes.  Pertinent labs & imaging results that were available during my care of the patient were reviewed by me and considered in my medical decision making (see chart for details).     Patient here under IVC by his significant other due to concerns for suicidality.  He does endorse passive SI at home but denies any active SI currently.  He has been medically cleared for psychiatric evaluation and treatment.  Final Clinical Impressions(s) / ED Diagnoses   Final diagnoses:  Suicidal ideation  Alcoholic intoxication without complication [facility](HCC)    ED Discharge Orders    None       Tilden Fossaees, Adhira Jamil, MD 01/25/17 2334

## 2017-01-25 NOTE — ED Triage Notes (Signed)
Patient escorted by [employer], reports his girlfriend called the police because she thought he "was going to kill himself" after he asked her to leave. Patient denies SI attempt but does admit to SI "every couple of days" without plan. States symptoms worsen in the winter. Patient reports drinking beer today. Denies HI/A/V/H.

## 2017-01-25 NOTE — ED Notes (Signed)
Patient wanded by security. 

## 2017-01-25 NOTE — ED Notes (Signed)
Bed: [facility] Expected date:  Expected time:  Means of arrival:  Comments: 

## 2017-01-26 ENCOUNTER — Inpatient Hospital Stay (HOSPITAL_COMMUNITY)
Admission: AD | Admit: 2017-01-26 | Discharge: 2017-01-31 | DRG: 897 | Disposition: A | Discharge status: Home / Self Care | Payer: Medicare Other | Source: Intra-hospital | Attending: Psychiatry | Admitting: Psychiatry

## 2017-01-26 ENCOUNTER — Other Ambulatory Visit: Payer: Self-pay

## 2017-01-26 ENCOUNTER — Encounter (HOSPITAL_COMMUNITY): Payer: Self-pay | Admitting: *Deleted

## 2017-01-26 DIAGNOSIS — Y908 Blood alcohol level of 240 mg/100 ml or more: Secondary | ICD-10-CM | POA: Diagnosis present

## 2017-01-26 DIAGNOSIS — F319 Bipolar disorder, unspecified: Secondary | ICD-10-CM | POA: Diagnosis present

## 2017-01-26 DIAGNOSIS — F1014 Alcohol abuse with alcohol-induced mood disorder: Principal | ICD-10-CM | POA: Diagnosis present

## 2017-01-26 DIAGNOSIS — R45851 Suicidal ideations: Secondary | ICD-10-CM | POA: Diagnosis present

## 2017-01-26 DIAGNOSIS — F419 Anxiety disorder, unspecified: Secondary | ICD-10-CM | POA: Diagnosis present

## 2017-01-26 DIAGNOSIS — F909 Attention-deficit hyperactivity disorder, unspecified type: Secondary | ICD-10-CM | POA: Diagnosis present

## 2017-01-26 DIAGNOSIS — F17210 Nicotine dependence, cigarettes, uncomplicated: Secondary | ICD-10-CM

## 2017-01-26 DIAGNOSIS — F15129 Other stimulant abuse with intoxication, unspecified: Secondary | ICD-10-CM | POA: Diagnosis present

## 2017-01-26 DIAGNOSIS — Z813 Family history of other psychoactive substance abuse and dependence: Secondary | ICD-10-CM | POA: Diagnosis not present

## 2017-01-26 DIAGNOSIS — Z811 Family history of alcohol abuse and dependence: Secondary | ICD-10-CM | POA: Diagnosis not present

## 2017-01-26 DIAGNOSIS — F6381 Intermittent explosive disorder: Secondary | ICD-10-CM | POA: Diagnosis present

## 2017-01-26 LAB — RAPID URINE DRUG SCREEN, HOSP PERFORMED
AMPHETAMINES: POSITIVE — AB
BARBITURATES: NOT DETECTED
BENZODIAZEPINES: NOT DETECTED
COCAINE: NOT DETECTED
OPIATES: NOT DETECTED
TETRAHYDROCANNABINOL: NOT DETECTED

## 2017-01-26 MED ORDER — LOPERAMIDE HCL 2 MG PO CAPS: 2.0000 mg | ORAL_CAPSULE | ORAL | Status: AC | PRN

## 2017-01-26 MED ORDER — HYDROXYZINE HCL 25 MG PO TABS
25.0000 mg | ORAL_TABLET | Freq: Three times a day (TID) | ORAL | Status: DC | PRN
  Administered 2017-01-29 – 2017-01-30 (×2): 25 mg via ORAL
  Filled 2017-01-26: qty 10
  Filled 2017-01-26 (×2): qty 1

## 2017-01-26 MED ORDER — TRAZODONE HCL 50 MG PO TABS
50.0000 mg | ORAL_TABLET | Freq: Every evening | ORAL | Status: DC | PRN
  Administered 2017-01-27 – 2017-01-30 (×4): 50 mg via ORAL
  Filled 2017-01-26: qty 1
  Filled 2017-01-26: qty 7
  Filled 2017-01-26: qty 1
  Filled 2017-01-26: qty 5
  Filled 2017-01-26: qty 1

## 2017-01-26 MED ORDER — GABAPENTIN 300 MG PO CAPS
300.0000 mg | ORAL_CAPSULE | Freq: Two times a day (BID) | ORAL | Status: DC
Start: 2017-01-26 — End: 2017-01-26

## 2017-01-26 MED ORDER — ADULT MULTIVITAMIN W/MINERALS CH
1.0000 | ORAL_TABLET | Freq: Every day | ORAL | Status: DC
  Administered 2017-01-26 – 2017-01-31 (×6): 1 via ORAL
  Filled 2017-01-26 (×8): qty 1

## 2017-01-26 MED ORDER — ALUM & MAG HYDROXIDE-SIMETH 200-200-20 MG/5ML PO SUSP: 30.0000 mL | ORAL | Status: DC | PRN

## 2017-01-26 MED ORDER — ONDANSETRON 4 MG PO TBDP
4.0000 mg | ORAL_TABLET | Freq: Four times a day (QID) | ORAL | Status: AC | PRN
  Filled 2017-01-26: qty 1

## 2017-01-26 MED ORDER — MAGNESIUM HYDROXIDE 400 MG/5ML PO SUSP: 30.0000 mL | Freq: Every day | ORAL | Status: DC | PRN

## 2017-01-26 MED ORDER — LORAZEPAM 1 MG PO TABS
1.0000 mg | ORAL_TABLET | Freq: Two times a day (BID) | ORAL | Status: AC
  Administered 2017-01-28 – 2017-01-29 (×2): 1 mg via ORAL
  Filled 2017-01-26 (×2): qty 1

## 2017-01-26 MED ORDER — GABAPENTIN 300 MG PO CAPS
300.0000 mg | ORAL_CAPSULE | Freq: Two times a day (BID) | ORAL | Status: DC
  Administered 2017-01-26 – 2017-01-31 (×11): 300 mg via ORAL
  Filled 2017-01-26 (×2): qty 14
  Filled 2017-01-26 (×9): qty 1
  Filled 2017-01-26: qty 14
  Filled 2017-01-26: qty 1
  Filled 2017-01-26: qty 14
  Filled 2017-01-26 (×2): qty 1

## 2017-01-26 MED ORDER — LORAZEPAM 1 MG PO TABS
1.0000 mg | ORAL_TABLET | Freq: Four times a day (QID) | ORAL | Status: AC | PRN
  Administered 2017-01-28: 1 mg via ORAL
  Filled 2017-01-26: qty 1

## 2017-01-26 MED ORDER — BENZTROPINE MESYLATE 1 MG PO TABS
1.0000 mg | ORAL_TABLET | Freq: Four times a day (QID) | ORAL | Status: DC | PRN
  Administered 2017-01-29 – 2017-01-30 (×2): 1 mg via ORAL
  Filled 2017-01-26 (×2): qty 1

## 2017-01-26 MED ORDER — VITAMIN B-1 100 MG PO TABS
100.0000 mg | ORAL_TABLET | Freq: Every day | ORAL | Status: DC
  Administered 2017-01-27 – 2017-01-31 (×5): 100 mg via ORAL
  Filled 2017-01-26 (×7): qty 1

## 2017-01-26 MED ORDER — LORAZEPAM 1 MG PO TABS
1.0000 mg | ORAL_TABLET | Freq: Three times a day (TID) | ORAL | Status: AC
  Administered 2017-01-27 – 2017-01-28 (×3): 1 mg via ORAL
  Filled 2017-01-26 (×3): qty 1

## 2017-01-26 MED ORDER — HYDROXYZINE HCL 25 MG PO TABS
25.0000 mg | ORAL_TABLET | Freq: Four times a day (QID) | ORAL | Status: AC | PRN
Start: 2017-01-26 — End: 2017-01-29
  Administered 2017-01-28 – 2017-01-29 (×2): 25 mg via ORAL
  Filled 2017-01-26 (×2): qty 1

## 2017-01-26 MED ORDER — LORAZEPAM 1 MG PO TABS
1.0000 mg | ORAL_TABLET | Freq: Four times a day (QID) | ORAL | Status: AC
  Administered 2017-01-26 – 2017-01-27 (×4): 1 mg via ORAL
  Filled 2017-01-26 (×4): qty 1

## 2017-01-26 MED ORDER — THIAMINE HCL 100 MG/ML IJ SOLN
100.0000 mg | Freq: Once | INTRAMUSCULAR | Status: AC
  Administered 2017-01-26: 100 mg via INTRAMUSCULAR
  Filled 2017-01-26: qty 2

## 2017-01-26 MED ORDER — LORAZEPAM 1 MG PO TABS
1.0000 mg | ORAL_TABLET | Freq: Every day | ORAL | Status: AC
  Administered 2017-01-30: 1 mg via ORAL
  Filled 2017-01-26: qty 1

## 2017-01-26 MED ORDER — NICOTINE 21 MG/24HR TD PT24
21.0000 mg | MEDICATED_PATCH | Freq: Every day | TRANSDERMAL | Status: DC
  Administered 2017-01-27 – 2017-01-31 (×5): 21 mg via TRANSDERMAL
  Filled 2017-01-26 (×8): qty 1

## 2017-01-26 MED ORDER — ACETAMINOPHEN 325 MG PO TABS: 650.0000 mg | ORAL_TABLET | Freq: Four times a day (QID) | ORAL | Status: DC | PRN

## 2017-01-26 MED ORDER — HALOPERIDOL 5 MG PO TABS
5.0000 mg | ORAL_TABLET | Freq: Four times a day (QID) | ORAL | Status: DC | PRN
  Administered 2017-01-29 – 2017-01-30 (×2): 5 mg via ORAL
  Filled 2017-01-26 (×2): qty 1
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
## 2017-01-26 NOTE — Progress Notes (Signed)
[name] is a 36 year old male pt admitted on involuntary basis. He reports that him and his girlfriend had a fight and reports that his girlfriend said that if I get kicked out [place]. He denies any SI on admission and reports that everything his girlfriend is saying is not true and is able to contract for safety while in the hospital. He does admit that he has some mood changes when the winter comes around but reports he usually adjusts after a month or so. He denies any substance abuse issues and reports that he thinks he was positive for amphetamines because his girlfriend has been smoking all the time around him. He reports that he is not on any medications and reports that he does not have any medical issues. He reports that he now lives alone and will go back to the same situation after discharge. He was oriented to the unit and safety maintained.

## 2017-01-26 NOTE — Progress Notes (Signed)
Nursing Progress Note: 7p-7a D: Pt currently presents with a flat/empty/pleasant affect and behavior. Pt states "I'm fine." Answered only in affirmative after that. Interacting minimally with the milieu. Pt reports fair sleep during the previous night with current medication regimen. Pt did attend wrap-up group.  A: Pt provided with medications per providers orders. Pt's labs and vitals were monitored throughout the night. Pt supported emotionally and encouraged to express concerns and questions. Pt educated on medications.  R: Pt's safety ensured with 15 minute and environmental checks. Pt currently denies SI, HI, and AVH. Pt verbally contracts to seek staff if SI,HI, or AVH occurs and to consult with staff before acting on any harmful thoughts. Will continue to monitor.

## 2017-01-26 NOTE — Consult Note (Signed)
[place] [facility] Consult   Reason for Consult:  Aggression, Homicidal and suicidal  Referring Physician:  EDP Patient Identification: [name] MRN:  [id number] Principal Diagnosis: Alcohol abuse with alcohol-induced mood disorder [facility]) Diagnosis:   Patient Active Problem List   Diagnosis Date Noted  . Alcohol abuse with alcohol-induced mood disorder ([place]) [F10.14] 01/26/2017    Priority: High  . Intermittent explosive disorder [F63.81] 01/26/2017    Priority: High    Total Time spent with patient: 45 minutes  Subjective:   [name] is a 36 y.o. male patient admitted with homicidal and suicidal thoughts.  HPI:  Patient with history of ADHD and Mood disorder who was IVC'd by his girlfriend after he threatened to kill himself, harm his girlfriend and their dogs  after an heated  Arguments between them. Patient states that he was intoxicated with alcohol. Patient reportedly brought out his gun and placed it on the table but says he did not intend to use the gun. Patient reports recurrent mood swings and gets easily agitated. He reports long history of Alcohol abuse and occasionally abuses other drugs.  Past Psychiatric History: ADHD, Mood disorder  Risk to Self: Suicidal Ideation: Yes-Currently Present(Per IVC, pt stated everyday he wants to die. Pt denies. ) Suicidal Intent: No Is patient at risk for suicide?: Yes Suicidal Plan?: No Access to Means: Yes Specify Access to Suicidal Means: Pt has access to gun.  What has been your use of drugs/alcohol within the last 12 months?: Alcoho; UDS is pending.  How many times?: 0 Other Self Harm Risks: Cutting.  Triggers for Past Attempts: None known Intentional Self Injurious Behavior: Cutting Comment - Self Injurious Behavior: Pt reported, he hast cut himself was in high school to cope with being bullied.  Risk to Others: Homicidal Ideation: No(Pt denies.) Thoughts of Harm to Others: No(Pt denies. ) Current Homicidal  Intent: No(Pt denies.) Current Homicidal Plan: No Access to Homicidal Means: Yes Describe Access to Homicidal Means: Pt has access to guns.  Identified Victim: Pt denies. Per IVC, his partner and dogs.  History of harm to others?: No(Pt denies. ) Assessment of Violence: None Noted Violent Behavior Description: NA Does patient have access to weapons?: Yes (Comment)(guns. ) Criminal Charges Pending?: No Does patient have a court date: No Prior Inpatient Therapy: Prior Inpatient Therapy: Yes Prior Therapy Dates: Pt reported, when he was younger. Prior Therapy Facilty/Provider(s): [facility].  Reason for Treatment: Pt reported, anger issues.  Prior Outpatient Therapy: Prior Outpatient Therapy: No Prior Therapy Dates: NA Prior Therapy Facilty/Provider(s): NA Reason for Treatment: NA Does patient have an ACCT team?: No Does patient have Intensive In-House Services?  : No Does patient have [facility] services? : No Does patient have P4CC services?: No  Past Medical History: History reviewed. No pertinent past medical history.  Past Surgical History:  Procedure Laterality Date  . arm tendon surgery     Family History: No family history on file. Family Psychiatric  History:  Social History:  Social History   Substance and Sexual Activity  Alcohol Use Yes   Comment: occasionally     Social History   Substance and Sexual Activity  Drug Use No    Social History   Socioeconomic History  . Marital status: Single    Spouse name: None  . Number of children: None  . Years of education: None  . Highest education level: None  Social Needs  . Financial resource strain: None  . Food insecurity -  worry: None  . Food insecurity - inability: None  . Transportation needs - medical: None  . Transportation needs - non-medical: None  Occupational History  . None  Tobacco Use  . Smoking status: Current Every Day Smoker  Substance and Sexual Activity  . Alcohol use: Yes    Comment:  occasionally  . Drug use: No  . Sexual activity: None  Other Topics Concern  . None  Social History Narrative  . None   Additional Social History:    Allergies:  No Known Allergies  Labs:  Results for orders placed or performed during the hospital encounter of 01/25/17 (from the past 48 hour(s))  Rapid urine drug screen (hospital performed)     Status: Abnormal   Collection Time: 01/25/17  9:49 PM  Result Value Ref Range   Opiates NONE DETECTED NONE DETECTED   Cocaine NONE DETECTED NONE DETECTED   Benzodiazepines NONE DETECTED NONE DETECTED   Amphetamines POSITIVE (A) NONE DETECTED   Tetrahydrocannabinol NONE DETECTED NONE DETECTED   Barbiturates NONE DETECTED NONE DETECTED    Comment:        DRUG SCREEN FOR MEDICAL PURPOSES ONLY.  IF CONFIRMATION IS NEEDED FOR ANY PURPOSE, NOTIFY LAB WITHIN 5 DAYS.        LOWEST DETECTABLE LIMITS FOR URINE DRUG SCREEN Drug Class       Cutoff (ng/mL) Amphetamine      1000 Barbiturate      200 Benzodiazepine   627 Tricyclics       035 Opiates          300 Cocaine          300 THC              50   Comprehensive metabolic panel     Status: Abnormal   Collection Time: 01/25/17 10:07 PM  Result Value Ref Range   Sodium 138 135 - 145 mmol/L   Potassium 3.7 3.5 - 5.1 mmol/L   Chloride 106 101 - 111 mmol/L   CO2 20 (L) 22 - 32 mmol/L   Glucose, Bld 91 65 - 99 mg/dL   BUN 8 6 - 20 mg/dL   Creatinine, Ser 0.75 0.61 - 1.24 mg/dL   Calcium 8.9 8.9 - 10.3 mg/dL   Total Protein 7.7 6.5 - 8.1 g/dL   Albumin 4.4 3.5 - 5.0 g/dL   AST 84 (H) 15 - 41 U/L   ALT 73 (H) 17 - 63 U/L   Alkaline Phosphatase 48 38 - 126 U/L   Total Bilirubin 0.5 0.3 - 1.2 mg/dL   GFR calc non Af Amer >60 >60 mL/min   GFR calc Af Amer >60 >60 mL/min    Comment: (NOTE) The eGFR has been calculated using the CKD EPI equation. This calculation has not been validated in all clinical situations. eGFR's persistently <60 mL/min signify possible Chronic  Kidney Disease.    Anion gap 12 5 - 15  Ethanol     Status: Abnormal   Collection Time: 01/25/17 10:07 PM  Result Value Ref Range   Alcohol, Ethyl (B) 281 (H) <10 mg/dL    Comment:        LOWEST DETECTABLE LIMIT FOR SERUM ALCOHOL IS 10 mg/dL FOR MEDICAL PURPOSES ONLY   Salicylate level     Status: None   Collection Time: 01/25/17 10:07 PM  Result Value Ref Range   Salicylate Lvl <0.0 2.8 - 30.0 mg/dL  Acetaminophen level     Status: Abnormal   Collection  Time: 01/25/17 10:07 PM  Result Value Ref Range   Acetaminophen (Tylenol), Serum <10 (L) 10 - 30 ug/mL    Comment:        THERAPEUTIC CONCENTRATIONS VARY SIGNIFICANTLY. A RANGE OF 10-30 ug/mL MAY BE AN EFFECTIVE CONCENTRATION FOR MANY PATIENTS. HOWEVER, SOME ARE BEST TREATED AT CONCENTRATIONS OUTSIDE THIS RANGE. ACETAMINOPHEN CONCENTRATIONS >150 ug/mL AT 4 HOURS AFTER INGESTION AND >50 ug/mL AT 12 HOURS AFTER INGESTION ARE OFTEN ASSOCIATED WITH TOXIC REACTIONS.   cbc     Status: None   Collection Time: 01/25/17 10:07 PM  Result Value Ref Range   WBC 8.8 4.0 - 10.5 K/uL   RBC 4.65 4.22 - 5.81 MIL/uL   Hemoglobin 15.3 13.0 - 17.0 g/dL   HCT 43.7 39.0 - 52.0 %   MCV 94.0 78.0 - 100.0 fL   MCH 32.9 26.0 - 34.0 pg   MCHC 35.0 30.0 - 36.0 g/dL   RDW 13.0 11.5 - 15.5 %   Platelets 308 150 - 400 K/uL    Current Facility-Administered Medications  Medication Dose Route Frequency Provider Last Rate Last Dose  . alum & mag hydroxide-simeth (MAALOX/MYLANTA) 200-200-20 MG/5ML suspension 30 mL  30 mL Oral Q6H PRN Quintella Reichert, MD      . gabapentin (NEURONTIN) capsule 300 mg  300 mg Oral BID Jackilyn Umphlett, MD      . ibuprofen (ADVIL,MOTRIN) tablet 600 mg  600 mg Oral Q8H PRN Quintella Reichert, MD      . LORazepam (ATIVAN) injection 0-4 mg  0-4 mg Intravenous Q6H Quintella Reichert, MD       Or  . LORazepam (ATIVAN) tablet 0-4 mg  0-4 mg Oral Q6H Quintella Reichert, MD      . Derrill Memo ON 01/28/2017] LORazepam (ATIVAN) injection  0-4 mg  0-4 mg Intravenous Q12H Quintella Reichert, MD       Or  . Derrill Memo ON 01/28/2017] LORazepam (ATIVAN) tablet 0-4 mg  0-4 mg Oral Q12H Quintella Reichert, MD      . nicotine (NICODERM CQ - dosed in mg/24 hours) patch 21 mg  21 mg Transdermal Daily Quintella Reichert, MD      . ondansetron [facility]) tablet 4 mg  4 mg Oral Q8H PRN Quintella Reichert, MD      . thiamine (VITAMIN B-1) tablet 100 mg  100 mg Oral Daily Quintella Reichert, MD       Current Outpatient Medications  Medication Sig Dispense Refill  . ibuprofen (ADVIL,MOTRIN) 200 MG tablet Take 400 mg by mouth every 4 (four) hours as needed for headache.      Musculoskeletal: Strength & Muscle Tone: within normal limits Gait & Station: normal Patient leans: N/A  Psychiatric Specialty Exam: Physical Exam  Psychiatric: His speech is normal. His affect is angry and labile. He is agitated and aggressive. Cognition and memory are normal. He expresses impulsivity. He expresses homicidal and suicidal ideation.    Review of Systems  Constitutional: Negative.   HENT: Negative.   Eyes: Negative.   Respiratory: Negative.   Cardiovascular: Negative.   Gastrointestinal: Negative.   Genitourinary: Negative.   Musculoskeletal: Negative.   Skin: Negative.   Neurological: Negative.   Endo/Heme/Allergies: Negative.   Psychiatric/Behavioral: Positive for suicidal ideas. The patient is nervous/anxious.     Blood pressure (!) 165/80, pulse (!) 103, temperature 98.9 F (37.2 C), temperature source Oral, resp. rate 18, SpO2 96 %.There is no height or weight on file to calculate BMI.  General Appearance: Casual  Eye Contact:  Good  Speech:  Clear and Coherent  Volume:  Increased  Mood:  Depressed, Dysphoric and Hopeless  Affect:  Constricted  Thought Process:  Coherent  Orientation:  Full (Time, Place, and Person)  Thought Content:  Logical  Suicidal Thoughts:  Yes.  without intent/plan  Homicidal Thoughts:  Yes.  without intent/plan  Memory:   Immediate;   Fair Recent;   Fair Remote;   Fair  Judgement:  Poor  Insight:  Shallow  Psychomotor Activity:  Psychomotor Retardation  Concentration:  Concentration: Fair and Attention Span: Fair  Recall:  [employer] of Knowledge:  Fair  Language:  Fair  Akathisia:  No  Handed:  Right  AIMS (if indicated):     Assets:  Communication Skills  ADL's:  Intact  Cognition:  WNL  Sleep:   poor     Treatment Plan Summary: Daily contact with patient to assess and evaluate symptoms and progress in treatment and Medication management Start Gabapentin 300 mg bid for aggression/alcohol withdrawal.   Disposition: Recommend psychiatric Inpatient admission when medically cleared.  Corena Pilgrim, MD 01/26/2017 11:07 AM

## 2017-01-26 NOTE — Progress Notes (Signed)
Patient ID: Bradley DessertBrian A [name], male   DOB: 13-Oct-1980, 36 y.o.   MRN: [id number] Per State regulations 482.30 this chart was reviewed for medical necessity with respect to the patient's admission/duration of stay.    Next review date: 01/28/17  Thurman CoyerEric Tanis Hensarling, BSN, RN-BC  Case Manager

## 2017-01-26 NOTE — Tx Team (Signed)
Initial Treatment Plan 01/26/2017 2:20 PM Blima DessertBrian A Yanes OAC:[id number]    PATIENT STRESSORS: Marital or family conflict   PATIENT STRENGTHS: Ability for insight Average or above average intelligence Capable of independent living General fund of knowledge Motivation for treatment/growth   PATIENT IDENTIFIED PROBLEMS: Depression "My mood does change when the winter comes around" "My girlfriend said I was gonna kill myself with a gun but it's not true"                     DISCHARGE CRITERIA:  Ability to meet basic life and health needs Improved stabilization in mood, thinking, and/or behavior Verbal commitment to aftercare and medication compliance  PRELIMINARY DISCHARGE PLAN: Attend aftercare/continuing care group Return to previous living arrangement  PATIENT/FAMILY INVOLVEMENT: This treatment plan has been presented to and reviewed with the patient, Blima DessertBrian A Cooperman, and/or family member, .  The patient and family have been given the opportunity to ask questions and make suggestions.  Dayelin Balducci, [place], [place]RN 01/26/2017, 2:20 PM

## 2017-01-26 NOTE — Progress Notes (Signed)
Pt attended NA group this evening.  

## 2017-01-26 NOTE — BH Assessment (Signed)
[facility] Assessment Progress Note  Pt presents under IVC initiated by pt's girlfriend, which Dr Jannifer FranklinAkintayo has upheld.  Bradley Canninghomas Avi Kerschner, MA Triage Specialist [phone]

## 2017-01-26 NOTE — BH Assessment (Addendum)
01/26/17 Dr. Jannifer FranklinAkintayo and Elta GuadeloupeLaurie Parks, NP, recommend IP treatment. Per Julieanne Cottonina, AC, [id number] 1 pm to Dr. Jama Flavorsobos. [facility] and pt can be transported by police.  Call report to [phone].
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
## 2017-01-27 DIAGNOSIS — F1014 Alcohol abuse with alcohol-induced mood disorder: Principal | ICD-10-CM

## 2017-01-27 DIAGNOSIS — F6381 Intermittent explosive disorder: Secondary | ICD-10-CM

## 2017-01-27 DIAGNOSIS — F17210 Nicotine dependence, cigarettes, uncomplicated: Secondary | ICD-10-CM

## 2017-01-27 NOTE — Progress Notes (Signed)
Nursing Progress Note: 7p-7a D: Pt currently presents with a ambivalent/flat affect and behavior. Pt states "I am fine. I'm just stuck here. No one knows that I literally have no plan to hurt myself not now or ever." Interacting appropriately with the milieu. Pt reports good sleep during the previous night with current medication regimen. Pt did attend wrap-up group.  A: Pt provided with medications per providers orders. Pt's labs and vitals were monitored throughout the night. Pt supported emotionally and encouraged to express concerns and questions. Pt educated on medications.  R: Pt's safety ensured with 15 minute and environmental checks. Pt currently denies SI, HI, and AVH. Pt verbally contracts to seek staff if SI,HI, or AVH occurs and to consult with staff before acting on any harmful thoughts. Will continue to monitor.

## 2017-01-27 NOTE — H&P (Signed)
Psychiatric Admission Assessment Adult  Patient Identification: [name] MRN:  [id number] Date of Evaluation:  01/27/2017 Chief Complaint:  Suicidal thoughts Principal Diagnosis: SIMD Diagnosis:   Patient Active Problem List   Diagnosis Date Noted  . Alcohol abuse with alcohol-induced mood disorder ([place]) [F10.14] 01/26/2017  . Intermittent explosive disorder [F63.81] 01/26/2017   History of Present Illness:  36 y.o Caucasian, single, works as a Merchant navy officer. Background history of SUD. Presented to the ER via [employer]. His girlfriend called the police because she was worried he was going to kill himself.  They have been arguing a lot. Patient had repeatedly threatened to take his own life. Last week he held a loaded gun to his own head. He is intoxicated with amphetamine and alcohol. BAL 273m/dl.  At interview, patient is dismissive of being suicidal. Says he has been with his current girlfriend for five months. Says they were having an argument and she called the police. Patient states that he was cleaning the gun and had it on the table. Says he never had the intention to kill her and kill himself. Says she had her bags packed and was ready to leave. Patient admits to daily use of alcohol. Denies use of amphetamine. Says his girlfriend smokes a lot of it daily. Feels he must have inhaled a lot from her. Patient says he is functioning well at home and at work. Does not feel alcohol is affecting his life. Says he has no desire to quit drinking. Does not feel anti-craving medication would help him. No sweatiness, no headaches. No retching, nausea or vomiting. No fullness in the head. No visual, tactile or auditory hallucination. No internal restlessness. No suicidal or homicidal thoughts. Says he has never had withdrawals in the past. Patient reports that he has a lot of guns in his house.   Total Time spent with patient: 1 hour  Past Psychiatric History: Long history of AUD. Has been diagnosed with  ADHD and Bipolar disorder in the past. Says he has been tried on a lot of medications over the years. Says medication always caused him problems and never helped him in anyway. He has been off medications for many years. No past inpatient care. No past suicidal behavior.   Is the patient at risk to self? No.  Has the patient been a risk to self in the past 6 months? No.  Has the patient been a risk to self within the distant past? No.  Is the patient a risk to others? No.  Has the patient been a risk to others in the past 6 months? No.  Has the patient been a risk to others within the distant past? No.   Prior Inpatient Therapy:   Prior Outpatient Therapy:    Alcohol Screening: 1. How often do you have a drink containing alcohol?: 2 to 3 times a week 2. How many drinks containing alcohol do you have on a typical day when you are drinking?: 3 or 4 3. How often do you have six or more drinks on one occasion?: Monthly AUDIT-C Score: 6 4. How often during the last year have you found that you were not able to stop drinking once you had started?: Less than monthly 5. How often during the last year have you failed to do what was normally expected from you becasue of drinking?: Never 6. How often during the last year have you needed a first drink in the morning to get yourself going after a heavy drinking session?:  Never 7. How often during the last year have you had a feeling of guilt of remorse after drinking?: Never 8. How often during the last year have you been unable to remember what happened the night before because you had been drinking?: Never 9. Have you or someone else been injured as a result of your drinking?: No 10. Has a relative or friend or a doctor or another health worker been concerned about your drinking or suggested you cut down?: Yes, but not in the last year Alcohol Use Disorder Identification Test Final Score (AUDIT): 9 Intervention/Follow-up: Alcohol Education Substance  Abuse History in the last 12 months:  Yes.   Consequences of Substance Abuse: As above  Previous Psychotropic Medications: Yes  Psychological Evaluations: Yes  Past Medical History: History reviewed. No pertinent past medical history.  Past Surgical History:  Procedure Laterality Date  . arm tendon surgery     Family History: History reviewed. No pertinent family history. Family Psychiatric  History: Father is a recovered alcoholic. He was also addicted to cocaine.   Tobacco Screening: Have you used any form of tobacco in the last 30 days? (Cigarettes, Smokeless Tobacco, Cigars, and/or Pipes): Yes Tobacco use, Select all that apply: 5 or more cigarettes per day Are you interested in Tobacco Cessation Medications?: Yes, will notify MD for an order Counseled patient on smoking cessation including recognizing danger situations, developing coping skills and basic information about quitting provided: Refused/Declined practical counseling Social History:  Social History   Substance and Sexual Activity  Alcohol Use Yes   Comment: occasionally     Social History   Substance and Sexual Activity  Drug Use No    Additional Social History:                           Allergies:  No Known Allergies Lab Results:  Results for orders placed or performed during the hospital encounter of 01/25/17 (from the past 48 hour(s))  Rapid urine drug screen (hospital performed)     Status: Abnormal   Collection Time: 01/25/17  9:49 PM  Result Value Ref Range   Opiates NONE DETECTED NONE DETECTED   Cocaine NONE DETECTED NONE DETECTED   Benzodiazepines NONE DETECTED NONE DETECTED   Amphetamines POSITIVE (A) NONE DETECTED   Tetrahydrocannabinol NONE DETECTED NONE DETECTED   Barbiturates NONE DETECTED NONE DETECTED    Comment:        DRUG SCREEN FOR MEDICAL PURPOSES ONLY.  IF CONFIRMATION IS NEEDED FOR ANY PURPOSE, NOTIFY LAB WITHIN 5 DAYS.        LOWEST DETECTABLE LIMITS FOR URINE DRUG  SCREEN Drug Class       Cutoff (ng/mL) Amphetamine      1000 Barbiturate      200 Benzodiazepine   678 Tricyclics       938 Opiates          300 Cocaine          300 THC              50   Comprehensive metabolic panel     Status: Abnormal   Collection Time: 01/25/17 10:07 PM  Result Value Ref Range   Sodium 138 135 - 145 mmol/L   Potassium 3.7 3.5 - 5.1 mmol/L   Chloride 106 101 - 111 mmol/L   CO2 20 (L) 22 - 32 mmol/L   Glucose, Bld 91 65 - 99 mg/dL   BUN 8 6 - 20  mg/dL   Creatinine, Ser 0.75 0.61 - 1.24 mg/dL   Calcium 8.9 8.9 - 10.3 mg/dL   Total Protein 7.7 6.5 - 8.1 g/dL   Albumin 4.4 3.5 - 5.0 g/dL   AST 84 (H) 15 - 41 U/L   ALT 73 (H) 17 - 63 U/L   Alkaline Phosphatase 48 38 - 126 U/L   Total Bilirubin 0.5 0.3 - 1.2 mg/dL   GFR calc non Af Amer >60 >60 mL/min   GFR calc Af Amer >60 >60 mL/min    Comment: (NOTE) The eGFR has been calculated using the CKD EPI equation. This calculation has not been validated in all clinical situations. eGFR's persistently <60 mL/min signify possible Chronic Kidney Disease.    Anion gap 12 5 - 15  Ethanol     Status: Abnormal   Collection Time: 01/25/17 10:07 PM  Result Value Ref Range   Alcohol, Ethyl (B) 281 (H) <10 mg/dL    Comment:        LOWEST DETECTABLE LIMIT FOR SERUM ALCOHOL IS 10 mg/dL FOR MEDICAL PURPOSES ONLY   Salicylate level     Status: None   Collection Time: 01/25/17 10:07 PM  Result Value Ref Range   Salicylate Lvl <6.9 2.8 - 30.0 mg/dL  Acetaminophen level     Status: Abnormal   Collection Time: 01/25/17 10:07 PM  Result Value Ref Range   Acetaminophen (Tylenol), Serum <10 (L) 10 - 30 ug/mL    Comment:        THERAPEUTIC CONCENTRATIONS VARY SIGNIFICANTLY. A RANGE OF 10-30 ug/mL MAY BE AN EFFECTIVE CONCENTRATION FOR MANY PATIENTS. HOWEVER, SOME ARE BEST TREATED AT CONCENTRATIONS OUTSIDE THIS RANGE. ACETAMINOPHEN CONCENTRATIONS >150 ug/mL AT 4 HOURS AFTER INGESTION AND >50 ug/mL AT 12 HOURS AFTER  INGESTION ARE OFTEN ASSOCIATED WITH TOXIC REACTIONS.   cbc     Status: None   Collection Time: 01/25/17 10:07 PM  Result Value Ref Range   WBC 8.8 4.0 - 10.5 K/uL   RBC 4.65 4.22 - 5.81 MIL/uL   Hemoglobin 15.3 13.0 - 17.0 g/dL   HCT 43.7 39.0 - 52.0 %   MCV 94.0 78.0 - 100.0 fL   MCH 32.9 26.0 - 34.0 pg   MCHC 35.0 30.0 - 36.0 g/dL   RDW 13.0 11.5 - 15.5 %   Platelets 308 150 - 400 K/uL    Blood Alcohol level:  Lab Results  Component Value Date   ETH 281 (H) 01/25/2017   ETH (H) 07/29/2008    186        LOWEST DETECTABLE LIMIT FOR SERUM ALCOHOL IS 5 mg/dL FOR MEDICAL PURPOSES ONLY    Metabolic Disorder Labs:  No results found for: HGBA1C, MPG No results found for: PROLACTIN No results found for: CHOL, TRIG, HDL, CHOLHDL, VLDL, LDLCALC  Current Medications: Current Facility-Administered Medications  Medication Dose Route Frequency Provider Last Rate Last Dose  . acetaminophen (TYLENOL) tablet 650 mg  650 mg Oral Q6H PRN Ethelene Hal, NP      . alum & mag hydroxide-simeth (MAALOX/MYLANTA) 200-200-20 MG/5ML suspension 30 mL  30 mL Oral Q4H PRN Ethelene Hal, NP      . haloperidol (HALDOL) tablet 5 mg  5 mg Oral Q6H PRN Ethelene Hal, NP       And  . benztropine (COGENTIN) tablet 1 mg  1 mg Oral Q6H PRN Ethelene Hal, NP      . gabapentin (NEURONTIN) capsule 300 mg  300 mg Oral BID  Ethelene Hal, NP   300 mg at 01/27/17 3300  . hydrOXYzine (ATARAX/VISTARIL) tablet 25 mg  25 mg Oral Q6H PRN Ethelene Hal, NP      . hydrOXYzine (ATARAX/VISTARIL) tablet 25 mg  25 mg Oral TID PRN Ethelene Hal, NP      . loperamide (IMODIUM) capsule 2-4 mg  2-4 mg Oral PRN Ethelene Hal, NP      . LORazepam (ATIVAN) tablet 1 mg  1 mg Oral Q6H PRN Ethelene Hal, NP      . LORazepam (ATIVAN) tablet 1 mg  1 mg Oral TID Ethelene Hal, NP       Followed by  . [START ON 01/28/2017] LORazepam (ATIVAN) tablet 1 mg  1 mg  Oral BID Ethelene Hal, NP       Followed by  . [START ON 01/30/2017] LORazepam (ATIVAN) tablet 1 mg  1 mg Oral Daily Ethelene Hal, NP      . magnesium hydroxide (MILK OF MAGNESIA) suspension 30 mL  30 mL Oral Daily PRN Ethelene Hal, NP      . multivitamin with minerals tablet 1 tablet  1 tablet Oral Daily Ethelene Hal, NP   1 tablet at 01/27/17 0849  . nicotine (NICODERM CQ - dosed in mg/24 hours) patch 21 mg  21 mg Transdermal Daily Ethelene Hal, NP   21 mg at 01/27/17 0849  . ondansetron (ZOFRAN-ODT) disintegrating tablet 4 mg  4 mg Oral Q6H PRN Ethelene Hal, NP      . thiamine (VITAMIN B-1) tablet 100 mg  100 mg Oral Daily Ethelene Hal, NP   100 mg at 01/27/17 0849  . traZODone (DESYREL) tablet 50 mg  50 mg Oral QHS PRN Ethelene Hal, NP       PTA Medications: Medications Prior to Admission  Medication Sig Dispense Refill Last Dose  . ibuprofen (ADVIL,MOTRIN) 200 MG tablet Take 400 mg by mouth every 4 (four) hours as needed for headache.   Past Week at Unknown time    Musculoskeletal: Strength & Muscle Tone: within normal limits Gait & Station: normal Patient leans: N/A  Psychiatric Specialty Exam: Physical Exam  Constitutional: He appears well-developed and well-nourished.  HENT:  Head: Normocephalic and atraumatic.  Respiratory: Effort normal.  Neurological: He is alert.  Psychiatric:  As above    ROS  Blood pressure (!) 148/93, pulse (!) 103, temperature (!) 97.5 F (36.4 C), temperature source Oral, resp. rate 15, height 5' 7" (1.702 m), weight 68 kg (150 lb).Body mass index is 23.49 kg/m.  General Appearance: Not shaky, not sweaty, not confused. Not unsteady, normal conjugate eye movements. Not internally distressed. Appropriate behavior.   Eye Contact:  Good  Speech:  Clear and Coherent and Normal Rate  Volume:  Normal  Mood:  Euthymic  Affect:  Appropriate and Full Range  Thought Process:  Linear   Orientation:  Full (Time, Place, and Person)  Thought Content:  No delusional theme. No preoccupation with violent thoughts. No negative ruminations. No obsession.  No hallucination in any modality.   Suicidal Thoughts:  No  Homicidal Thoughts:  No  Memory:  Immediate;   Good Recent;   Good Remote;   Good  Judgement:  Fair  Insight:  Shallow  Psychomotor Activity:  Normal  Concentration:  Concentration: Good and Attention Span: Good  Recall:  Good  Fund of Knowledge:  Good  Language:  Good  Akathisia:  Handed:    AIMS (if indicated):     Assets:  Agricultural consultant Housing Physical Health Resilience Talents/Skills Vocational/Educational  ADL's:  Intact  Cognition:  WNL  Sleep:  Number of Hours: 6.25    Treatment Plan Summary: Patient presented with alcohol and methamphetamine intoxication. He denies any suicidal or homicidal thoughts. He is not showing any evidence of psychosis. He is not manic or depressed. He does not want to take any psychotropic medication. He refused anti-craving medications too. He has consented to voluntary admission. We plan to detox him from psychoactive substances and gather collateral from his family.  Psychiatric: SUD SIMD  Medical:  Psychosocial:  Relational issues.  PLAN: 1. Alcohol withdrawal protocol 2. Encourage unit groups and activities 3. Monitor mood, behavior and interaction with peers 4. Motivational enhancement  5. SW gather collateral from his girlfriend/family.  6. SW would facilitate aftercare.   Observation Level/Precautions:  Detox 15 minute checks  Laboratory:    Psychotherapy:    Medications:    Consultations:    Discharge Concerns:    Estimated LOS:  Other:     Physician Treatment Plan for Primary Diagnosis: <principal problem not specified> Long Term Goal(s): Improvement in symptoms so as ready for discharge  Short Term Goals: Ability to identify changes in lifestyle to  reduce recurrence of condition will improve, Ability to verbalize feelings will improve, Ability to disclose and discuss suicidal ideas, Ability to demonstrate self-control will improve, Ability to identify and develop effective coping behaviors will improve, Ability to maintain clinical measurements within normal limits will improve, Compliance with prescribed medications will improve and Ability to identify triggers associated with substance abuse/mental health issues will improve  Physician Treatment Plan for Secondary Diagnosis: Active Problems:   Alcohol abuse with alcohol-induced mood disorder ([place])  Long Term Goal(s): Improvement in symptoms so as ready for discharge  Short Term Goals: Ability to identify changes in lifestyle to reduce recurrence of condition will improve, Ability to verbalize feelings will improve, Ability to disclose and discuss suicidal ideas, Ability to demonstrate self-control will improve, Ability to identify and develop effective coping behaviors will improve, Ability to maintain clinical measurements within normal limits will improve, Compliance with prescribed medications will improve and Ability to identify triggers associated with substance abuse/mental health issues will improve  I certify that inpatient services furnished can reasonably be expected to improve the patient's condition.    [place], MD 11/22/20182:13 PM

## 2017-01-27 NOTE — [facility] Suicide Risk Assessment (Signed)
[facility]   Nursing information obtained from:    Demographic factors:    Current Mental Status:    Loss Factors:    Historical Factors:    Risk Reduction Factors:     Total Time spent with patient: 30 minutes Principal Problem: <principal problem not specified> Diagnosis:   Patient Active Problem List   Diagnosis Date Noted  . Alcohol abuse with alcohol-induced mood disorder ([facility]) [F10.14] 01/26/2017  . Intermittent explosive disorder [F63.81] 01/26/2017   Subjective Data:  36 y.o Caucasian, single, works as a Pensions consultanttechnician. Background history of SUD. Presented to the ER via [facility]. His girlfriend called the police because she was worried he was going to kill himself.  They have been arguing a lot. Patient had repeatedly threatened to take his own life. Last week he held a loaded gun to his own head. He is intoxicated with amphetamine and alcohol. BAL 281mg /dl. No past suicidal behavior, no family history of suicide, no evidence of psychosis. No evidence of mania. No cognitive impairment. He is cooperative with care. He has agreed to treatment recommendations. He has agreed to communicate suicidal thoughts of with staff if the thoughts becomes overwhelming.     Continued Clinical Symptoms:  Alcohol Use Disorder Identification Test Final Score (AUDIT): 9 The "Alcohol Use Disorders Identification Test", Guidelines for Use in Primary Care, Second Edition.  World Science writerHealth Organization [facility](WHO). Score between 0-7:  no or low risk or alcohol related problems. Score between 8-15:  moderate risk of alcohol related problems. Score between 16-19:  high risk of alcohol related problems. Score 20 or above:  warrants further diagnostic evaluation for alcohol dependence and treatment.   CLINICAL FACTORS:   Alcohol/Substance Abuse/Dependencies   Musculoskeletal: Strength & Muscle Tone: within normal limits Gait & Station: normal Patient leans: N/A  Psychiatric Specialty  Exam: Physical Exam  ROS  Blood pressure (!) 148/93, pulse (!) 103, temperature (!) 97.5 F (36.4 C), temperature source Oral, resp. rate 15, height 5\' 7"  (1.702 m), weight 68 kg (150 lb).Body mass index is 23.49 kg/m.  General Appearance: As in H&P  Eye Contact:    Speech:    Volume:    Mood:    Affect:    Thought Process:    Orientation:    Thought Content:  As in H&P  Suicidal Thoughts:    Homicidal Thoughts:    Memory:    Judgement:    Insight:    Psychomotor Activity:    Concentration:    Recall:    Fund of Knowledge:    Language:    Akathisia:  As in H&P  Handed:    AIMS (if indicated):     Assets:    ADL's:    Cognition:  As in H&P  Sleep:  Number of Hours: 6.25      COGNITIVE FEATURES THAT CONTRIBUTE TO RISK:  None    SUICIDE RISK:   Mild:  Suicidal ideation of limited frequency, intensity, duration, and specificity.  There are no identifiable plans, no associated intent, mild dysphoria and related symptoms, good self-control (both objective and subjective assessment), few other risk factors, and identifiable protective factors, including available and accessible social support.  PLAN OF CARE:  As in H&P  I certify that inpatient services furnished can reasonably be expected to improve the patient's condition.   Georgiann CockerVincent A Dimetrius Montfort, MD 01/27/2017, 2:41 PM

## 2017-01-27 NOTE — Progress Notes (Signed)
D: Patient observed up and visible in milieu though interaction is minimal. Cautious on approach. Appears to be minimizing events PTA stating, "I'm fine. No problems today." Denies withdrawal, SI/HI. No AVH and denies hx of. Patient's affect flat, mood apprehensive. Per self inventory and discussions with writer, rates depression at a 0/10, hopelessness at a 0/10 and anxiety at a 2/10. Rates sleep as good, appetite as good, energy as high and concentration as good.  States goal for today is "speaking with doctors and interact more with other peers." Denies pain, physical problems. CIWA scored at a "5" as patient does have mild tremor and anxiety. When asked about tremor patient states, "oh, it's just from the coffee."   A: Medicated per orders, no prns requested or required. Patient remains on ativan protocol. Level III obs in place for safety. Emotional support offered and self inventory reviewed. Encouraged completion of Suicide Safety Plan and programming participation. Discussed POC with MD, SW.   R: Patient verbalizes understanding of POC. Patient denies SI/HI/AVH and remains safe on level III obs. Will continue to monitor closely and make verbal contact frequently.

## 2017-01-28 NOTE — Progress Notes (Signed)
Patient did attend the evening speaker AA meeting. Pt did enter the meeting 20 minutes after it began because pt reported feeling anxious after getting off phone with girlfriend and requested medicine from the nurse.

## 2017-01-28 NOTE — Plan of Care (Signed)
Nurse discussed depression/anxiety/coping skills with patient. 

## 2017-01-28 NOTE — Tx Team (Signed)
Interdisciplinary Treatment and Diagnostic Plan Update  01/28/2017 Time of Session: 9:00 AM [name] MRN: [id number]  Principal Diagnosis: Alcohol abuse with alcohol-induced mood disorder (HCC)  Secondary Diagnoses: Principal Problem:   Alcohol abuse with alcohol-induced mood disorder (HCC)   Current Medications:  Current Facility-Administered Medications  Medication Dose Route Frequency Provider Last Rate Last Dose  . acetaminophen (TYLENOL) tablet 650 mg  650 mg Oral Q6H PRN Laveda Abbe, NP      . alum & mag hydroxide-simeth (MAALOX/MYLANTA) 200-200-20 MG/5ML suspension 30 mL  30 mL Oral Q4H PRN Laveda Abbe, NP      . haloperidol (HALDOL) tablet 5 mg  5 mg Oral Q6H PRN Laveda Abbe, NP       And  . benztropine (COGENTIN) tablet 1 mg  1 mg Oral Q6H PRN Laveda Abbe, NP      . gabapentin (NEURONTIN) capsule 300 mg  300 mg Oral BID Laveda Abbe, NP   300 mg at 01/28/17 6433  . hydrOXYzine (ATARAX/VISTARIL) tablet 25 mg  25 mg Oral Q6H PRN Laveda Abbe, NP      . hydrOXYzine (ATARAX/VISTARIL) tablet 25 mg  25 mg Oral TID PRN Laveda Abbe, NP      . loperamide (IMODIUM) capsule 2-4 mg  2-4 mg Oral PRN Laveda Abbe, NP      . LORazepam (ATIVAN) tablet 1 mg  1 mg Oral Q6H PRN Laveda Abbe, NP      . LORazepam (ATIVAN) tablet 1 mg  1 mg Oral BID Laveda Abbe, NP       Followed by  . [START ON 01/30/2017] LORazepam (ATIVAN) tablet 1 mg  1 mg Oral Daily Laveda Abbe, NP      . magnesium hydroxide (MILK OF MAGNESIA) suspension 30 mL  30 mL Oral Daily PRN Laveda Abbe, NP      . multivitamin with minerals tablet 1 tablet  1 tablet Oral Daily Laveda Abbe, NP   1 tablet at 01/28/17 0810  . nicotine (NICODERM CQ - dosed in mg/24 hours) patch 21 mg  21 mg Transdermal Daily Laveda Abbe, NP   21 mg at 01/28/17 2951  . ondansetron (ZOFRAN-ODT) disintegrating tablet 4 mg   4 mg Oral Q6H PRN Laveda Abbe, NP      . thiamine (VITAMIN B-1) tablet 100 mg  100 mg Oral Daily Laveda Abbe, NP   100 mg at 01/28/17 0810  . traZODone (DESYREL) tablet 50 mg  50 mg Oral QHS PRN Laveda Abbe, NP   50 mg at 01/27/17 2245   PTA Medications: Medications Prior to Admission  Medication Sig Dispense Refill Last Dose  . ibuprofen (ADVIL,MOTRIN) 200 MG tablet Take 400 mg by mouth every 4 (four) hours as needed for headache.   Past Week at Unknown time    Patient Stressors: Marital or family conflict  Patient Strengths: Ability for insight Average or above average intelligence Capable of independent living [employer] of knowledge Motivation for treatment/growth  Treatment Modalities: Medication Management, Group therapy, Case management,  1 to 1 session with clinician, Psychoeducation, Recreational therapy.   Physician Treatment Plan for Primary Diagnosis: Alcohol abuse with alcohol-induced mood disorder (HCC) Long Term Goal(s): Improvement in symptoms so as ready for discharge Improvement in symptoms so as ready for discharge   Short Term Goals: Ability to identify changes in lifestyle to reduce recurrence of condition will improve Ability to verbalize  feelings will improve Ability to disclose and discuss suicidal ideas Ability to demonstrate self-control will improve Ability to identify and develop effective coping behaviors will improve Ability to maintain clinical measurements within normal limits will improve Compliance with prescribed medications will improve Ability to identify triggers associated with substance abuse/mental health issues will improve Ability to identify changes in lifestyle to reduce recurrence of condition will improve Ability to verbalize feelings will improve Ability to disclose and discuss suicidal ideas Ability to demonstrate self-control will improve Ability to identify and develop effective coping behaviors  will improve Ability to maintain clinical measurements within normal limits will improve Compliance with prescribed medications will improve Ability to identify triggers associated with substance abuse/mental health issues will improve  Medication Management: Evaluate patient's response, side effects, and tolerance of medication regimen.  Therapeutic Interventions: 1 to 1 sessions, Unit Group sessions and Medication administration.  Evaluation of Outcomes: Progressing  Physician Treatment Plan for Secondary Diagnosis: Principal Problem:   Alcohol abuse with alcohol-induced mood disorder (HCC)  Long Term Goal(s): Improvement in symptoms so as ready for discharge Improvement in symptoms so as ready for discharge   Short Term Goals: Ability to identify changes in lifestyle to reduce recurrence of condition will improve Ability to verbalize feelings will improve Ability to disclose and discuss suicidal ideas Ability to demonstrate self-control will improve Ability to identify and develop effective coping behaviors will improve Ability to maintain clinical measurements within normal limits will improve Compliance with prescribed medications will improve Ability to identify triggers associated with substance abuse/mental health issues will improve Ability to identify changes in lifestyle to reduce recurrence of condition will improve Ability to verbalize feelings will improve Ability to disclose and discuss suicidal ideas Ability to demonstrate self-control will improve Ability to identify and develop effective coping behaviors will improve Ability to maintain clinical measurements within normal limits will improve Compliance with prescribed medications will improve Ability to identify triggers associated with substance abuse/mental health issues will improve     Medication Management: Evaluate patient's response, side effects, and tolerance of medication regimen.  Therapeutic  Interventions: 1 to 1 sessions, Unit Group sessions and Medication administration.  Evaluation of Outcomes: Progressing   RN Treatment Plan for Primary Diagnosis: Alcohol abuse with alcohol-induced mood disorder (HCC) Long Term Goal(s): Knowledge of disease and therapeutic regimen to maintain health will improve  Short Term Goals: Ability to demonstrate self-control, Ability to participate in decision making will improve, Ability to identify and develop effective coping behaviors will improve and Compliance with prescribed medications will improve  Medication Management: RN will administer medications as ordered by provider, will assess and evaluate patient's response and provide education to patient for prescribed medication. RN will report any adverse and/or side effects to prescribing provider.  Therapeutic Interventions: 1 on 1 counseling sessions, Psychoeducation, Medication administration, Evaluate responses to treatment, Monitor vital signs and CBGs as ordered, Perform/monitor CIWA, COWS, AIMS and Fall Risk screenings as ordered, Perform wound care treatments as ordered.  Evaluation of Outcomes: Progressing   LCSW Treatment Plan for Primary Diagnosis: Alcohol abuse with alcohol-induced mood disorder (HCC) Long Term Goal(s): Safe transition to appropriate next level of care at discharge, Engage patient in therapeutic group addressing interpersonal concerns.  Short Term Goals: Engage patient in aftercare planning with referrals and resources, Increase emotional regulation, Facilitate patient progression through stages of change regarding substance use diagnoses and concerns and Identify triggers associated with mental health/substance abuse issues  Therapeutic Interventions: Assess for all discharge needs, 1 to 1  time with Child psychotherapistocial worker, Explore available resources and support systems, Assess for adequacy in community support network, Educate family and significant other(s) on suicide  prevention, Complete Psychosocial Assessment, Interpersonal group therapy.  Evaluation of Outcomes: Progressing   Progress in Treatment: Attending groups: No. Participating in groups: No. Taking medication as prescribed: Yes. Toleration medication: Yes. Family/Significant other contact made: Yes, individual(s) contacted:  mother Patient understands diagnosis: Yes. Discussing patient identified problems/goals with staff: Yes. Medical problems stabilized or resolved: Yes. Denies suicidal/homicidal ideation: Yes. Issues/concerns per patient self-inventory: No. Other:na  New problem(s) identified: No, Describe:  none at this time  New Short Term/Long Term Goal(s): "depression", "mood changes when seasons change", suicidal ideation which patient denies  Discharge Plan or Barriers: return home, follow up outpatient  Reason for Continuation of Hospitalization: Hallucinations Medication stabilization Suicidal ideation Withdrawal symptoms  Estimated Length of Stay:  3 - 5 days, 02/01/17  Attendees: Patient: 01/28/2017 4:16 PM  Physician: Cristino MartesV Izediuno MD 01/28/2017 4:16 PM  Nursing: Maryjo RochesterShalita RN 01/28/2017 4:16 PM  RN Care Manager: 01/28/2017 4:16 PM  Social Worker: Governor RooksA Cunningham LCSW 01/28/2017 4:16 PM  Recreational Therapist:  01/28/2017 4:16 PM  Other:  01/28/2017 4:16 PM  Other:  01/28/2017 4:16 PM  Other: 01/28/2017 4:16 PM    Scribe for Treatment Team: Sallee LangeAnne C Cunningham, LCSW 01/28/2017 4:16 PM

## 2017-01-28 NOTE — [facility] Suicide Risk Assessment (Signed)
[facility] INPATIENT:  Family/Significant Other Suicide Prevention Education  Suicide Prevention Education:  Education Completed; Corene Corneaeggy Bowring (mother) has been identified by the patient as the family member/significant other with whom the patient will be residing, and identified as the person(s) who will aid the patient in the event of a mental health crisis (suicidal ideations/suicide attempt).  With written consent from the patient, the family member/significant other has been provided the following suicide prevention education, prior to the and/or following the discharge of the patient.  The suicide prevention education provided includes the following:  Suicide risk factors  Suicide prevention and interventions  National Suicide Hotline telephone number  [facility] telephone number  [facility] Assistance 911  [facility] and/or Residential Mobile Crisis Unit telephone number  Request made of family/significant other to:  Remove weapons (e.g., guns, rifles, knives), all items previously/currently identified as safety concern.    Remove drugs/medications (over-the-counter, prescriptions, illicit drugs), all items previously/currently identified as a safety concern.  The family member/significant other verbalizes understanding of the suicide prevention education information provided.  The family member/significant other agrees to remove the items of safety concern listed above. Parents removed guns from home.   Daisy Floroandace L Piper Hassebrock MSW, LCSW  01/28/2017, 2:27 PM

## 2017-01-28 NOTE — [facility] Group Notes (Signed)
LCSW Group Therapy Note  01/28/2017 1:15pm  Type of Therapy and Topic:  Group Therapy:  Feelings around Relapse and Recovery  Participation Level:  Active   Description of Group:    Patients in this group will discuss emotions they experience before and after a relapse. They will process how experiencing these feelings, or avoidance of experiencing them, relates to having a relapse. Facilitator will guide patients to explore emotions they have related to recovery. Patients will be encouraged to process which emotions are more powerful. They will be guided to discuss the emotional reaction significant others in their lives may have to their relapse or recovery. Patients will be assisted in exploring ways to respond to the emotions of others without this contributing to a relapse.  Therapeutic Goals: 1. Patient will identify two or more emotions that lead to a relapse for them 2. Patient will identify two emotions that result when they relapse 3. Patient will identify two emotions related to recovery 4. Patient will demonstrate ability to communicate their needs through discussion and/or role plays   Summary of Patient Progress:  Patient participated in group discussion of Post Acute Withdrawal Syndrome (PAWS), reviewed signs and symptoms, processed ways to care for self and avoid relapse.     Therapeutic Modalities:   Cognitive Behavioral Therapy Solution-Focused Therapy Assertiveness Training Relapse Prevention Therapy   Maddi Collar C Aliany Fiorenza, LCSW 01/28/2017 5:10 PM   
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
## 2017-01-28 NOTE — Progress Notes (Signed)
D:  Patient's self inventory sheet, patient sleeps good, sleep medication helpful.  Good appetite, high energy level, good concentration.  Denied depression and hopeless, rated anxiety 2-3.  Denied withdrawals.  Does have cigarette cravings.  Denied SI.  Denied physical problems.  Denied physical pain.  Goal is to continue to be positive and have a great day.  Plans to talk to peers about proplan.  Staff is very friendly.  Does have discharge plans. A:  Medications administered per MD orders.  Emotional support and encouragement given patient. R:  Denied SI and HI, contracts for safety.  Denied A/V hallucinations.  Safety maintained with 15 minute checks.

## 2017-01-28 NOTE — Progress Notes (Signed)
[facility]  01/28/2017 1:24 PM Blima DessertBrian A Mazzaferro  MRN:  [id number] Subjective:   36 y.o Caucasian, single, works as a Pensions consultanttechnician. Background history of SUD. Presented to the ER via [employer]. His girlfriend called the police because she was worried he was going to kill himself.  They have been arguing a lot. Patient had repeatedly threatened to take his own life. Last week he held a loaded gun to his own head. He is intoxicated with amphetamine and alcohol. BAL 281mg /dl.  Chart reviewed today. Patient discussed at team today.  Staff reports that he has been pleasant. He has not been observed to be internally stimulated. He has not voiced any anxiety or distress. Repeatedly denies being suicidal or homicidal. He is taking Ativan as prescribed. Vital signs are unstable as he is still detoxing.   Seen today. Tells me that he is fine and just waiting to be discharged. Says he is not a danger to anyone or himself. Reinstated that his girlfriend made this whole thing up. No sweatiness, no headaches. No retching, nausea or vomiting. No fullness in the head. No visual, tactile or auditory hallucination. No internal restlessness. No violent thoughts.    Principal Problem: Alcohol abuse with alcohol-induced mood disorder (HCC) Diagnosis:   Patient Active Problem List   Diagnosis Date Noted  . Alcohol abuse with alcohol-induced mood disorder (HCC) [F10.14] 01/26/2017  . Intermittent explosive disorder [F63.81] 01/26/2017   Total Time spent with patient: 20 minutes  Past Psychiatric History: As in H&P  Past Medical History: History reviewed. No pertinent past medical history.  Past Surgical History:  Procedure Laterality Date  . arm tendon surgery     Family History: History reviewed. No pertinent family history. Family Psychiatric  History: As in H&P Social History:  Social History   Substance and Sexual Activity  Alcohol Use Yes   Comment: occasionally     Social History   Substance and  Sexual Activity  Drug Use No    Social History   Socioeconomic History  . Marital status: Single    Spouse name: None  . Number of children: None  . Years of education: None  . Highest education level: None  Social Needs  . Financial resource strain: None  . Food insecurity - worry: None  . Food insecurity - inability: None  . Transportation needs - medical: None  . Transportation needs - non-medical: None  Occupational History  . None  Tobacco Use  . Smoking status: Current Every Day Smoker  . Smokeless tobacco: Never Used  Substance and Sexual Activity  . Alcohol use: Yes    Comment: occasionally  . Drug use: No  . Sexual activity: Yes  Other Topics Concern  . None  Social History Narrative  . None   Additional Social History:      Sleep: Good  Appetite:  Good  Current Medications: Current Facility-Administered Medications  Medication Dose Route Frequency Provider Last Rate Last Dose  . acetaminophen (TYLENOL) tablet 650 mg  650 mg Oral Q6H PRN Laveda AbbeParks, Laurie Britton, NP      . alum & mag hydroxide-simeth (MAALOX/MYLANTA) 200-200-20 MG/5ML suspension 30 mL  30 mL Oral Q4H PRN Laveda AbbeParks, Laurie Britton, NP      . haloperidol (HALDOL) tablet 5 mg  5 mg Oral Q6H PRN Laveda AbbeParks, Laurie Britton, NP       And  . benztropine (COGENTIN) tablet 1 mg  1 mg Oral Q6H PRN Laveda AbbeParks, Laurie Britton, NP      .  gabapentin (NEURONTIN) capsule 300 mg  300 mg Oral BID Laveda Abbe, NP   300 mg at 01/28/17 1308  . hydrOXYzine (ATARAX/VISTARIL) tablet 25 mg  25 mg Oral Q6H PRN Laveda Abbe, NP      . hydrOXYzine (ATARAX/VISTARIL) tablet 25 mg  25 mg Oral TID PRN Laveda Abbe, NP      . loperamide (IMODIUM) capsule 2-4 mg  2-4 mg Oral PRN Laveda Abbe, NP      . LORazepam (ATIVAN) tablet 1 mg  1 mg Oral Q6H PRN Laveda Abbe, NP      . LORazepam (ATIVAN) tablet 1 mg  1 mg Oral BID Laveda Abbe, NP       Followed by  . [START ON 01/30/2017]  LORazepam (ATIVAN) tablet 1 mg  1 mg Oral Daily Laveda Abbe, NP      . magnesium hydroxide (MILK OF MAGNESIA) suspension 30 mL  30 mL Oral Daily PRN Laveda Abbe, NP      . multivitamin with minerals tablet 1 tablet  1 tablet Oral Daily Laveda Abbe, NP   1 tablet at 01/28/17 0810  . nicotine (NICODERM CQ - dosed in mg/24 hours) patch 21 mg  21 mg Transdermal Daily Laveda Abbe, NP   21 mg at 01/28/17 6578  . ondansetron (ZOFRAN-ODT) disintegrating tablet 4 mg  4 mg Oral Q6H PRN Laveda Abbe, NP      . thiamine (VITAMIN B-1) tablet 100 mg  100 mg Oral Daily Laveda Abbe, NP   100 mg at 01/28/17 0810  . traZODone (DESYREL) tablet 50 mg  50 mg Oral QHS PRN Laveda Abbe, NP   50 mg at 01/27/17 2245    Lab Results: No results found for this or any previous visit (from the past 48 hour(s)).  Blood Alcohol level:  Lab Results  Component Value Date   ETH 281 (H) 01/25/2017   ETH (H) 07/29/2008    186        LOWEST DETECTABLE LIMIT FOR SERUM ALCOHOL IS 5 mg/dL FOR MEDICAL PURPOSES ONLY    Metabolic Disorder Labs: No results found for: HGBA1C, MPG No results found for: PROLACTIN No results found for: CHOL, TRIG, HDL, CHOLHDL, VLDL, LDLCALC  Physical Findings: AIMS: Facial and Oral Movements Muscles of Facial Expression: None, normal Lips and Perioral Area: None, normal Jaw: None, normal Tongue: None, normal,Extremity Movements Upper (arms, wrists, hands, fingers): None, normal Lower (legs, knees, ankles, toes): None, normal, Trunk Movements Neck, shoulders, hips: None, normal, Overall Severity Severity of abnormal movements (highest score from questions above): None, normal Incapacitation due to abnormal movements: None, normal Patient's awareness of abnormal movements (rate only patient's report): No Awareness, Dental Status Current problems with teeth and/or dentures?: No Does patient usually wear dentures?: No  CIWA:   CIWA-Ar Total: 3 COWS:     Musculoskeletal: Strength & Muscle Tone: within normal limits Gait & Station: normal Patient leans: N/A  Psychiatric Specialty Exam: Physical Exam  Constitutional: He appears well-developed and well-nourished.  HENT:  Head: Normocephalic and atraumatic.  Respiratory: Effort normal.  Neurological: He is alert.  Psychiatric:  As above     ROS  Blood pressure 125/76, pulse 96, temperature 97.7 F (36.5 C), temperature source Oral, resp. rate 18, height 5\' 7"  (1.702 m), weight 68 kg (150 lb).Body mass index is 23.49 kg/m.  General Appearance: Not shaky, not sweaty, not confused. Not unsteady, normal conjugate eye movements. Not internally  distressed. Appropriate behavior.   Eye Contact:  Good  Speech:  Clear and Coherent and Normal Rate  Volume:  Normal  Mood:  Euthymic  Affect:  Appropriate and Full Range  Thought Process:  Linear  Orientation:  Full (Time, Place, and Person)  Thought Content:  No delusional theme. No preoccupation with violent thoughts. No negative ruminations. No obsession.  No hallucination in any modality.  Suicidal Thoughts:  No  Homicidal Thoughts:  No  Memory:  Immediate;   Good Recent;   Good Remote;   Good  Judgement:  Good  Insight:  Shallow  Psychomotor Activity:  Normal  Concentration:  Concentration: Good and Attention Span: Good  Recall:  Good  Fund of Knowledge:  Good  Language:  Good  Akathisia:  Negative  Handed:    AIMS (if indicated):     Assets:  Communication Skills Desire for Improvement Financial Resources/Insurance Housing Physical Health Resilience Talents/Skills Transportation Vocational/Educational  ADL's:  Intact  Cognition:  WNL  Sleep:  Number of Hours: 6.25     Treatment Plan Summary: Patient is coming off psychoactive substances smoothly. Vitals are still erratic. He is on tapering doses of Ativan. No associated psychosis. No dangerousness. Still needs inpatient detox. Hopeful  discharge by Sunday.  Psychiatric: SUD SIMD  Medical:  Psychosocial:  Relational issues.  PLAN: 1. Continue current regmen 2. Continue to monitor mood, behavior and interaction with peers 3. Collateral from his family/girlfriend.   Georgiann CockerVincent A Izediuno, MD 01/28/2017, 1:24 PM

## 2017-01-29 NOTE — [facility] Group Notes (Signed)
[facility]  Date/Time:    01/29/2017 10:00-11:00AM  Type of Therapy and Topic:  Group Therapy:  Healthy vs Unhealthy Coping Skills  Participation Level:  Active   Description of Group:  The focus of this group was to determine what unhealthy coping techniques typically are used by group members and what healthy coping techniques would be helpful in coping with various problems.  In particular we addressed the unhealthy coping techniques typically used on a rainy, gloomy day like today, and what would be a healthier coping method to use. Patients were guided in becoming aware of the differences between healthy and unhealthy coping techniques.   Patients were encouraged to consider the changes they can make in their lives including taking medication, telling their doctor when their medicine is not working, introducing new hobbies, creating a schedule and many more.  Therapeutic Goals 1. Patients learned that coping is what human beings do all day long to deal with various situations in their lives 2. Patients defined and discussed healthy vs unhealthy coping techniques 3. Patients identified their preferred coping techniques and identified whether these were healthy or unhealthy 4. Patients determined 1-2 healthy coping skills they would like to become more familiar with and use more often 5. Patients provided support and ideas to each other  Summary of Patient Progress: During group, patient expressed on a day like today he would typically stay home and isolate himself, and he agreed that Alcoholics Anonymous meetings could be very helpful for him to incorporate into his life.   Therapeutic Modalities Cognitive Behavioral Therapy Motivational Interviewing   Ambrose MantleMareida Grossman-Orr, LCSW 01/29/2017, 12:08 PM

## 2017-01-29 NOTE — Progress Notes (Signed)
[facility]  01/29/2017 12:28 PM Blima DessertBrian A Effertz  MRN:  [id number] Subjective:   36 y.o Caucasian, single, works as a Pensions consultanttechnician. Background history of SUD. Presented to the ER via [employer]. His girlfriend called the police because she was worried he was going to kill himself.  They have been arguing a lot. Patient had repeatedly threatened to take his own life. Last week he held a loaded gun to his own head. He is intoxicated with amphetamine and alcohol. BAL 281mg /dl.  Chart reviewed today. Patient discussed at team today.  Today patient is seen ambulating the hallways and inquiring about medication for anxiety. He states he is doing what he is told so that he may discharge. He appears to be progressing well to treatment as evident by decreased depressive and anxiety symptoms. He denies any cravings, urges, or withdrawals with the exception of Nicotine. He is currently wearing a 21 mg nicotine patch. He is currently not on any anti-depressants, and continues to minimize his reasons for being admitted to the unit. Today his goal is to increase his interaction with people. He reports upon discharge he plans to return home, be more stable, and utilize his support system. He shows some increased insight insight as noted by developing and utilizing some coping skills. He denies si/hi/avh, does not appear to be responding to internal stimuli.    Principal Problem: Alcohol abuse with alcohol-induced mood disorder (HCC) Diagnosis:   Patient Active Problem List   Diagnosis Date Noted  . Alcohol abuse with alcohol-induced mood disorder (HCC) [F10.14] 01/26/2017  . Intermittent explosive disorder [F63.81] 01/26/2017   Total Time spent with patient: 20 minutes  Past Psychiatric History: As in H&P  Past Medical History: History reviewed. No pertinent past medical history.  Past Surgical History:  Procedure Laterality Date  . arm tendon surgery     Family History: History reviewed. No pertinent  family history. Family Psychiatric  History: As in H&P Social History:  Social History   Substance and Sexual Activity  Alcohol Use Yes   Comment: occasionally     Social History   Substance and Sexual Activity  Drug Use No    Social History   Socioeconomic History  . Marital status: Single    Spouse name: None  . Number of children: None  . Years of education: None  . Highest education level: None  Social Needs  . Financial resource strain: None  . Food insecurity - worry: None  . Food insecurity - inability: None  . Transportation needs - medical: None  . Transportation needs - non-medical: None  Occupational History  . None  Tobacco Use  . Smoking status: Current Every Day Smoker  . Smokeless tobacco: Never Used  Substance and Sexual Activity  . Alcohol use: Yes    Comment: occasionally  . Drug use: No  . Sexual activity: Yes  Other Topics Concern  . None  Social History Narrative  . None   Additional Social History:      Sleep: Good  Appetite:  Good  Current Medications: Current Facility-Administered Medications  Medication Dose Route Frequency Provider Last Rate Last Dose  . acetaminophen (TYLENOL) tablet 650 mg  650 mg Oral Q6H PRN Laveda AbbeParks, Laurie Britton, NP      . alum & mag hydroxide-simeth (MAALOX/MYLANTA) 200-200-20 MG/5ML suspension 30 mL  30 mL Oral Q4H PRN Laveda AbbeParks, Laurie Britton, NP      . haloperidol (HALDOL) tablet 5 mg  5 mg Oral Q6H PRN  Laveda Abbe, NP       And  . benztropine (COGENTIN) tablet 1 mg  1 mg Oral Q6H PRN Laveda Abbe, NP      . gabapentin (NEURONTIN) capsule 300 mg  300 mg Oral BID Laveda Abbe, NP   300 mg at 01/29/17 0827  . hydrOXYzine (ATARAX/VISTARIL) tablet 25 mg  25 mg Oral Q6H PRN Laveda Abbe, NP   25 mg at 01/28/17 2022  . hydrOXYzine (ATARAX/VISTARIL) tablet 25 mg  25 mg Oral TID PRN Laveda Abbe, NP      . loperamide (IMODIUM) capsule 2-4 mg  2-4 mg Oral PRN Laveda Abbe, NP      . LORazepam (ATIVAN) tablet 1 mg  1 mg Oral Q6H PRN Laveda Abbe, NP   1 mg at 01/28/17 2232  . [START ON 01/30/2017] LORazepam (ATIVAN) tablet 1 mg  1 mg Oral Daily Laveda Abbe, NP      . magnesium hydroxide (MILK OF MAGNESIA) suspension 30 mL  30 mL Oral Daily PRN Laveda Abbe, NP      . multivitamin with minerals tablet 1 tablet  1 tablet Oral Daily Laveda Abbe, NP   1 tablet at 01/29/17 0827  . nicotine (NICODERM CQ - dosed in mg/24 hours) patch 21 mg  21 mg Transdermal Daily Laveda Abbe, NP   21 mg at 01/29/17 0827  . ondansetron (ZOFRAN-ODT) disintegrating tablet 4 mg  4 mg Oral Q6H PRN Laveda Abbe, NP      . thiamine (VITAMIN B-1) tablet 100 mg  100 mg Oral Daily Laveda Abbe, NP   100 mg at 01/29/17 0827  . traZODone (DESYREL) tablet 50 mg  50 mg Oral QHS PRN Laveda Abbe, NP   50 mg at 01/28/17 2232    Lab Results: No results found for this or any previous visit (from the past 48 hour(s)).  Blood Alcohol level:  Lab Results  Component Value Date   ETH 281 (H) 01/25/2017   ETH (H) 07/29/2008    186        LOWEST DETECTABLE LIMIT FOR SERUM ALCOHOL IS 5 mg/dL FOR MEDICAL PURPOSES ONLY    Metabolic Disorder Labs: No results found for: HGBA1C, MPG No results found for: PROLACTIN No results found for: CHOL, TRIG, HDL, CHOLHDL, VLDL, LDLCALC  Physical Findings: AIMS: Facial and Oral Movements Muscles of Facial Expression: None, normal Lips and Perioral Area: None, normal Jaw: None, normal Tongue: None, normal,Extremity Movements Upper (arms, wrists, hands, fingers): None, normal Lower (legs, knees, ankles, toes): None, normal, Trunk Movements Neck, shoulders, hips: None, normal, Overall Severity Severity of abnormal movements (highest score from questions above): None, normal Incapacitation due to abnormal movements: None, normal Patient's awareness of abnormal movements (rate  only patient's report): No Awareness, Dental Status Current problems with teeth and/or dentures?: No Does patient usually wear dentures?: No  CIWA:  CIWA-Ar Total: 1 COWS:  COWS Total Score: 2  Musculoskeletal: Strength & Muscle Tone: within normal limits Gait & Station: normal Patient leans: N/A  Psychiatric Specialty Exam: Physical Exam  Constitutional: He appears well-developed and well-nourished.  HENT:  Head: Normocephalic and atraumatic.  Respiratory: Effort normal.  Neurological: He is alert.  Psychiatric:  As above     ROS   Blood pressure 120/72, pulse (!) 121, temperature 97.8 F (36.6 C), temperature source Oral, resp. rate 20, height 5\' 7"  (1.702 m), weight 68 kg (150 lb).Body mass index  is 23.49 kg/m.  General Appearance: Not shaky, not sweaty, not confused. Not unsteady, normal conjugate eye movements. Not internally distressed. Appropriate behavior.   Eye Contact:  Good  Speech:  Clear and Coherent and Normal Rate  Volume:  Normal  Mood:  Euthymic  Affect:  Appropriate and Full Range  Thought Process:  Linear  Orientation:  Full (Time, Place, and Person)  Thought Content:  No delusional theme. No preoccupation with violent thoughts. No negative ruminations. No obsession.  No hallucination in any modality.  Suicidal Thoughts:  No  Homicidal Thoughts:  No  Memory:  Immediate;   Good Recent;   Good Remote;   Good  Judgement:  Good  Insight:  Shallow  Psychomotor Activity:  Normal  Concentration:  Concentration: Good and Attention Span: Good  Recall:  Good  Fund of Knowledge:  Good  Language:  Good  Akathisia:  Negative  Handed:    AIMS (if indicated):     Assets:  Communication Skills Desire for Improvement Financial Resources/Insurance Housing Physical Health Resilience Talents/Skills Transportation Vocational/Educational  ADL's:  Intact  Cognition:  WNL  Sleep:  Number of Hours: 5     Treatment Plan Summary: Patient is coming off  psychoactive substances smoothly. Vitals are still erratic. He is on tapering doses of Ativan. No associated psychosis. No dangerousness. Still needs inpatient detox. Hopeful discharge by Sunday.  Psychiatric: SUD SIMD  Medical:  Psychosocial:  Relational issues.  PLAN: 1. Continue current regmen. He is not on any depression medication at this time.  2. Continue to monitor mood, behavior and interaction with peers 3. Collateral from his family/girlfriend.   Truman Haywardakia S Starkes, FNP 01/29/2017, 12:28 PM

## 2017-01-29 NOTE — Progress Notes (Signed)
D   Pt is irritable and anxious    He said he just got off the phone with his girlfriend and they had a bad conversation   He appears depressed   He said he got distracted from his issues when he went to AA group and he said the meeting was helpful and was something he needed to do more frequently A    Verbal support given   Medications administered and effectiveness monitored    Q 15 min checks   discusssed coping skills and support systems  R   Pt is safe at present time and receptive to verbal support

## 2017-01-29 NOTE — Progress Notes (Signed)
D. Pt presents with an anxious affect, observed in milieu interacting with staff.Pt reports sleeping poorly last night despite sleep medication. Pt completed self inventory- rating his depression and hopelessness 3/10 and anxiety 4/10. Pt does endorse drug withdrawal symptoms (cravings, agitation and irritability) Pt currently denies SI/HI and AVH .A. Labs and vitals monitored. Pt is compliant with medications. Pt supported emotionally and encouraged to express concerns and ask questions.   R. Pt remains safe with 15 minute checks. Will continue POC.

## 2017-01-29 NOTE — Plan of Care (Signed)
  Progressing Safety: Periods of time without injury will increase 01/29/2017 2354 - Progressing by Arrie Aranhurch, Maghan Jessee J, RN Note Pt has not harmed self or others tonight.  He endorsed thoughts of harming others earlier today.  Pt denies SI.  He verbally contracts for safety.

## 2017-01-29 NOTE — Progress Notes (Addendum)
D: Pt was in the dayroom upon initial approach.  Pt presents with anxious, irritable affect and mood.  He reports his goal today was "making friends, talking to peers."  He reports feeling "angry" earlier and reports having thoughts of harming others with a pen, specifically "jabbing it into someone's neck" earlier today.  Pt denies SI, denies hallucinations, denies pain.  Pt has been visible in milieu interacting with peers and staff cautiously.  Pt attended evening group.    A: Introduced self to pt.  Actively listened to pt and offered support and encouragement. Medications administered per order.  PRN medication administered for agitation, anxiety, and sleep.  Q15 minute safety checks maintained.  No roommate order obtained from on-site provider.    R: Pt is safe on the unit.  Pt is compliant with medications.  Pt verbally contracts for safety.  Will continue to monitor and assess.

## 2017-01-30 MED ORDER — HYDROXYZINE HCL 25 MG PO TABS: 25.0000 mg | ORAL_TABLET | Freq: Three times a day (TID) | ORAL | 0 refills | Status: DC | PRN

## 2017-01-30 MED ORDER — GABAPENTIN 300 MG PO CAPS: 300.0000 mg | ORAL_CAPSULE | Freq: Two times a day (BID) | ORAL | 0 refills | Status: DC

## 2017-01-30 MED ORDER — TRAZODONE HCL 50 MG PO TABS: 50.0000 mg | ORAL_TABLET | Freq: Every evening | ORAL | 0 refills | Status: DC | PRN

## 2017-01-30 NOTE — Progress Notes (Signed)
D: Pt was in the dayroom upon initial approach.  Pt presents with anxious affect and anxious, irritable mood.  Pt denies SI, denies hallucinations, denies pain.  He reports having thoughts of harming others "earlier, but not too severely to where I'd actually do anything."  Pt has been visible in milieu interacting with peers and staff appropriately.  Pt attended evening group.    A: Introduced self to pt.  Actively listened to pt and offered support and encouragement.  PRN medication administered for anxiety, agitation, and sleep.  Q15 minute safety checks maintained.  R: Pt is safe on the unit.  Pt is compliant with medications.  Pt verbally contracts for safety.  Will continue to monitor and assess.

## 2017-01-30 NOTE — Progress Notes (Signed)
  [facility] :  Will you be returning to the same living situation after discharge:  Yes,  lives alone At discharge, do you have transportation home?: Yes,  family Do you have the ability to pay for your medications: Yes,  states there are no barriers  Release of information consent forms completed and turned in to Medical Records by CSW.  Patient to Follow up at: Follow-up Information    [facility] Follow up.   Why:  Hospital follow up appointment will be walk in. Walk in times are Monday through Friday between 8:00am-2:00pm.  Contact information: [address] [place] [address] [phone]        [facility]   Why:  Patient contacted agency prior to admission. Please call to schedule therapy appointment.  Contact information: [address] [place], [place]  [address] Phone: [phone]             Next level of care provider [facility]:no  Safety Planning and Suicide Prevention discussed: Yes,  with mother  Have you used any form of tobacco in the last 30 days? (Cigarettes, Smokeless Tobacco, Cigars, and/or Pipes): Yes  Has patient been referred to the Quitline?: Patient refused referral  Patient has been referred for addiction treatment: Yes  Lynnell ChadMareida J Grossman-Orr, LCSW 01/30/2017, 9:10 AM

## 2017-01-30 NOTE — Plan of Care (Signed)
  Progressing Activity: Sleeping patterns will improve 01/30/2017 2352 - Progressing by Arrie Aranhurch, Renny Gunnarson J, RN Note Slept 6.75 hours last night according to flowsheet.

## 2017-01-30 NOTE — [facility] Group Notes (Signed)
[facility] Group Notes: (Clinical Social Work)   01/30/2017      Type of Therapy:  Group Therapy   Participation Level:  Did Not Attend despite MHT prompting   Arpan Eskelson Grossman-Orr, LCSW 01/30/2017, 12:24 PM     

## 2017-01-30 NOTE — BHH Counselor (Signed)
Clinical Social Work Note  With written consent, CSW spoke with patient's mother Corene Corneaeggy Sheen [phone] about patient's planned discharge for today.  She stated she feels he is ready for discharge, in fact, will do better once he is able to get out and start focusing on moving forward.  She was concerned about him having resources for follow-up and these were explained to her, that he has referrals for therapy and for medication management and that both will be provided free of charge at this point because of his financial situation.  She reiterated that the guns have been removed from the home and are no longer even in parents' homes.  She stated girlfriend is currently staying at patient's home to take care of dogs, but does not normally live there.  She stated both patient and girlfriend seem perfectly reasonable until they argue, and mother has seen no evidence of either of them having individual problems with logic.  Mother is aware of the argument on the phone yesterday between patient and girlfriend, but that did not change her opinion about his readiness to discharge.  Ambrose MantleMareida Grossman-Orr, LCSW 01/30/2017, 1:34 PM

## 2017-01-30 NOTE — Progress Notes (Signed)
D. Pt presents with a flat affect and appropriate, but somewhat guarded behavior. Per pt's self inventory, pt rates his depression, hopelessness and anxiety a 2/2/0 respectively. Pt denies withdrawal symptoms and pain. Pt states that his most important goal today is to "get along with peers" by "talking more". Pt currently denies SI/HI and AVH and agrees to contact staff before acting on any harmful thoughts.  A. Labs and vitals monitored. Pt compliant with medications. Pt supported emotionally and encouraged to express concerns and ask questions.   R. Pt remains safe with 15 minute checks. Will continue POC.

## 2017-01-30 NOTE — Discharge Summary (Signed)
Physician Discharge Summary Note  Patient:  [name] is an 36 y.o., male MRN:  [id number] DOB:  01-24-1981 Patient phone:  [phone] (home)  Patient address:   [address] [place] [address],  Total Time spent with patient: 20 minutes  Date of Admission:  01/26/2017 Date of Discharge: 01/30/17   Reason for Admission:  Worsening depression and SI with substance abuse  Principal Problem: Alcohol abuse with alcohol-induced mood disorder [facility](HCC) Discharge Diagnoses: Patient Active Problem List   Diagnosis Date Noted  . Alcohol abuse with alcohol-induced mood disorder (HCC) [F10.14] 01/26/2017  . Intermittent explosive disorder [F63.81] 01/26/2017    Past Psychiatric History: Long history of AUD. Has been diagnosed with ADHD and Bipolar disorder in the past. Says he has been tried on a lot of medications over the years. Says medication always caused him problems and never helped him in anyway. He has been off medications for many years. No past inpatient care. No past suicidal behavior.    Past Medical History: History reviewed. No pertinent past medical history.  Past Surgical History:  Procedure Laterality Date  . arm tendon surgery     Family History: History reviewed. No pertinent family history. Family Psychiatric  History: Father is a recovered alcoholic. He was also addicted to cocaine.    Social History:  Social History   Substance and Sexual Activity  Alcohol Use Yes   Comment: occasionally     Social History   Substance and Sexual Activity  Drug Use No    Social History   Socioeconomic History  . Marital status: Single    Spouse name: None  . Number of children: None  . Years of education: None  . Highest education level: None  Social Needs  . Financial resource strain: None  . Food insecurity - worry: None  . Food insecurity - inability: None  . Transportation needs - medical: None  . Transportation needs - non-medical: None  Occupational  History  . None  Tobacco Use  . Smoking status: Current Every Day Smoker  . Smokeless tobacco: Never Used  Substance and Sexual Activity  . Alcohol use: Yes    Comment: occasionally  . Drug use: No  . Sexual activity: Yes  Other Topics Concern  . None  Social History Narrative  . None    Hospital Course[facility]   [facility]: 36 y.o Caucasian, single, works as a Pensions consultanttechnician. Background history of SUD. Presented to the ER via [employer]. His girlfriend called the police because she was worried he was going to kill himself.  They have been arguing a lot. Patient had repeatedly threatened to take his own life. Last week he held a loaded gun to his own head. He is intoxicated with amphetamine and alcohol. BAL 281mg /dl. At interview, patient is dismissive of being suicidal. Says he has been with his current girlfriend for five months. Says they were having an argument and she called the police. Patient states that he was cleaning the gun and had it on the table. Says he never had the intention to kill her and kill himself. Says she had her bags packed and was ready to leave. Patient admits to daily use of alcohol. Denies use of amphetamine. Says his girlfriend smokes a lot of it daily. Feels he must have inhaled a lot from her. Patient says he is functioning well at home and at work. Does not feel alcohol is affecting his life. Says he has no desire to quit  drinking. Does not feel anti-craving medication would help him. No sweatiness, no headaches. No retching, nausea or vomiting. No fullness in the head. No visual, tactile or auditory hallucination. No internal restlessness. No suicidal or homicidal thoughts. Says he has never had withdrawals in the past. Patient reports that he has a lot of guns in his house.  [facility] and stabilized with therapy and medication. Patient completed Ativan detox protocol and was started on Gabapentin 300 mg TID. Patient also used  Trazodone PRN for sleep and Vistaril PRN for anxiety. Patient has shown improvement with improved sleep, mood, affect, appetite, and interaction. Patient has been in the day room interacting with peers and staff appropriately. Patient has been attending groups and participating. [facility]. He will be provided prescriptions for his medications upon discharge. Patient denies any SI/HI/AVH and contracts for safety.    Physical Findings: AIMS: Facial and Oral Movements Muscles of Facial Expression: None, normal Lips and Perioral Area: None, normal Jaw: None, normal Tongue: None, normal,Extremity Movements Upper (arms, wrists, hands, fingers): None, normal Lower (legs, knees, ankles, toes): None, normal, Trunk Movements Neck, shoulders, hips: None, normal, Overall Severity Severity of abnormal movements (highest score from questions above): None, normal Incapacitation due to abnormal movements: None, normal Patient's awareness of abnormal movements (rate only patient's report): No Awareness, Dental Status Current problems with teeth and/or dentures?: No Does patient usually wear dentures?: No  CIWA:  CIWA-Ar Total: 2 COWS:  COWS Total Score: 2  Musculoskeletal: Strength & Muscle Tone: within normal limits Gait & Station: normal Patient leans: N/A  Psychiatric Specialty Exam: Physical Exam  Nursing note and vitals reviewed. Constitutional: He is oriented to person, place, and time. He appears well-developed and well-nourished.  Respiratory: Effort normal.  Musculoskeletal: Normal range of motion.  Neurological: He is alert and oriented to person, place, and time.  Skin: Skin is warm.    Review of Systems  Constitutional: Negative.   HENT: Negative.   Eyes: Negative.   Respiratory: Negative.   Cardiovascular: Negative.   Gastrointestinal: Negative.   Genitourinary: Negative.   Musculoskeletal: Negative.   Skin: Negative.   Neurological: Negative.    Endo/Heme/Allergies: Negative.   Psychiatric/Behavioral: Negative.     Blood pressure 135/86, pulse (!) 104, temperature 97.8 F (36.6 C), temperature source Oral, resp. rate 18, height 5\' 7"  (1.702 m), weight 68 kg (150 lb).Body mass index is 23.49 kg/m.  General Appearance: Casual  Eye Contact:  Good  Speech:  Clear and Coherent and Normal Rate  Volume:  Normal  Mood:  Euthymic  Affect:  Appropriate  Thought Process:  Goal Directed and Descriptions of Associations: Intact  Orientation:  Full (Time, Place, and Person)  Thought Content:  WDL  Suicidal Thoughts:  No  Homicidal Thoughts:  No  Memory:  Immediate;   Good Recent;   Good Remote;   Good  Judgement:  Good  Insight:  Good  Psychomotor Activity:  Normal  Concentration:  Concentration: Good and Attention Span: Good  Recall:  Good  Fund of Knowledge:  Good  Language:  Good  Akathisia:  No  Handed:  Right  AIMS (if indicated):     Assets:  Communication Skills Desire for Improvement Financial Resources/Insurance Housing Physical Health Social Support Transportation  ADL's:  Intact  Cognition:  WNL  Sleep:  Number of Hours: 6.75     Have you used any form of tobacco in the last  30 days? (Cigarettes, Smokeless Tobacco, Cigars, and/or Pipes): Yes  Has this patient used any form of tobacco in the last 30 days? (Cigarettes, Smokeless Tobacco, Cigars, and/or Pipes) Yes, Yes, A prescription for an FDA-approved tobacco cessation medication was offered at discharge and the patient refused  Blood Alcohol level:  Lab Results  Component Value Date   ETH 281 (H) 01/25/2017   ETH (H) 07/29/2008    186        LOWEST DETECTABLE LIMIT FOR SERUM ALCOHOL IS 5 mg/dL FOR MEDICAL PURPOSES ONLY    Metabolic Disorder Labs:  No results found for: HGBA1C, MPG No results found for: PROLACTIN No results found for: CHOL, TRIG, HDL, CHOLHDL, VLDL, LDLCALC  See Psychiatric Specialty Exam and Suicide Risk Assessment completed by  Attending Physician prior to discharge.  Discharge destination:  Home  Is patient on multiple antipsychotic therapies at discharge:  No   Has Patient had three or more failed trials of antipsychotic monotherapy by history:  No  Recommended Plan for Multiple Antipsychotic Therapies: NA   Allergies as of 01/30/2017   No Known Allergies     Medication List    STOP taking these medications   ibuprofen 200 MG tablet Commonly known as:  ADVIL,MOTRIN     TAKE these medications     Indication  gabapentin 300 MG capsule Commonly known as:  NEURONTIN Take 1 capsule (300 mg total) by mouth 2 (two) times daily. For withdrawal symptoms  Indication:  Aggressive Behavior, Agitation, Alcohol Withdrawal Syndrome   hydrOXYzine 25 MG tablet Commonly known as:  ATARAX/VISTARIL Take 1 tablet (25 mg total) by mouth 3 (three) times daily as needed for anxiety.  Indication:  Feeling Anxious   traZODone 50 MG tablet Commonly known as:  DESYREL Take 1 tablet (50 mg total) by mouth at bedtime as needed for sleep.  Indication:  Trouble Sleeping      Follow-up Information    [facility] Follow up.   Why:  Hospital follow up appointment will be walk in. Walk in times are Monday through Friday between 8:00am-2:00pm.  Contact information: [address] [place] [address] [phone]        [facility] Follow up.   Why:  Patient contacted agency prior to admission. Please call to schedule therapy appointment.  Contact information: [address], [address] [place], [place]  [address] Phone: [phone]             Follow-up recommendations:  Continue activity as tolerated. Continue diet as recommended by your PCP. Ensure to keep all appointments with outpatient providers.  Comments:  Patient is instructed prior to discharge to: Take all medications as prescribed by his/her mental healthcare provider. Report any adverse effects and or reactions from the medicines to his/her  outpatient provider promptly. Patient has been instructed & cautioned: To not engage in alcohol and or illegal drug use while on prescription medicines. In the event of worsening symptoms, patient is instructed to call the crisis hotline, 911 and or go to the nearest ED for appropriate evaluation and treatment of symptoms. To follow-up with his/her primary care provider for your other medical issues, concerns and or health care needs.    Signed: Gerlene Burdock Money, FNP 01/30/2017, 8:08 AM

## 2017-01-31 NOTE — Progress Notes (Addendum)
  [facility] Discharge [facility] :  Will you be returning to the same living situation after discharge:  Yes,  home with girlfriend At discharge, do you have transportation home?: Yes,  pt's mother Do you have the ability to pay for your medications: Yes,  medicare  Release of information consent forms completed and submitted to medical records by CSW.  Patient to Follow up at: Follow-up Information    [facility] Follow up on 02/03/2017.   Why:  Hospital follow-up on Thursday at 8:00AM. Please bring: photo ID, medicare card, and hospital discharge paperwork. Thank you.  Contact information: [address] [place] [address] [phone]        [facility].   Why:  Patient contacted agency prior to admission. Please call to schedule therapy appointment.  Contact information: [address] [place]  [address] Phone: [phone]             Next level of care provider [facility] to [facility] Link:no  Safety Planning and Suicide Prevention discussed: Yes,  SPE completed with pt's mother and girlfriend  Have you used any form of tobacco in the last 30 days? (Cigarettes, Smokeless Tobacco, Cigars, and/or Pipes): Yes  Has patient been referred to the Quitline?: Patient refused referral  Patient has been referred for addiction treatment: Yes  Pulte HomesHeather N Smart, LCSW 01/31/2017, 3:56 PM

## 2017-01-31 NOTE — Progress Notes (Signed)
Recreation Therapy Notes  Date: 01/31/17 Time:  0930 Location: 300 [address] Dayroom  Group Topic: Stress Management  Goal Area(s) Addresses:  Patient will verbalize importance of using healthy stress management.  Patient will identify positive emotions associated with healthy stress management.   Intervention: Stress Management  Activity : Guided Imagery.  LRT introduced the stress management technique of guided imagery.  LRT read a script that allowed patients to envision listening to the waves at the beach.  Patients were to follow along as LRT read script.  Education:  Stress Management, Discharge Planning.   Education Outcome: Acknowledges edcuation/In group clarification offered/Needs additional education  Clinical Observations/Feedback: Pt did not attend group.   Omere Marti, LRT/CTRS         Chivon Lepage A 01/31/2017 12:20 PM 

## 2017-01-31 NOTE — Progress Notes (Signed)
D: Patient observed resting in bed this AM. [place] for meds. Patient states he has no needs this AM. Patient's affect flat, mood irritable and guarded however patient is cooperative. Per self inventory and discussions with writer, rates depression at a 3/10, hopelessness at a 3/10 and anxiety at a 4/10. Rates sleep as good, appetite as good, energy as normal and concentration as good.  States goal for today is to "exercise." Denies pain, physical problems.   A: Medicated per orders, no prns required or reported. Level III obs in place for safety. Emotional support offered and self inventory reviewed. Encouraged completion of Suicide Safety Plan and programming participation. Discussed POC with MD, SW.    R: Patient verbalizes understanding of POC. Patient denies SI/AVH however admits to thoughts to harm others. "I just get irritable at times. I don't have any intent or plan and I won't act on anything." He contracts for safety with this Clinical research associatewriter. Patient remains safe on level III obs. Will continue to monitor closely and make verbal contact frequently.

## 2017-01-31 NOTE — [facility] Suicide Risk Assessment (Signed)
[facility]   Principal Problem: Alcohol abuse with alcohol-induced mood disorder [facility](HCC) Discharge Diagnoses:  Patient Active Problem List   Diagnosis Date Noted  . Alcohol abuse with alcohol-induced mood disorder (HCC) [F10.14] 01/26/2017  . Intermittent explosive disorder [F63.81] 01/26/2017    Total Time spent with patient: 30 minutes  Musculoskeletal: Strength & Muscle Tone: within normal limits Gait & Station: normal Patient leans: N/A  Psychiatric Specialty Exam: Review of Systems  Constitutional: Negative.   HENT: Negative.   Eyes: Negative.   Respiratory: Negative.   Cardiovascular: Negative.   Gastrointestinal: Negative.   Genitourinary: Negative.   Musculoskeletal: Negative.   Skin: Negative.   Neurological: Negative.   Endo/Heme/Allergies: Negative.   Psychiatric/Behavioral: Negative for depression, hallucinations, memory loss and suicidal ideas. The patient is not nervous/anxious and does not have insomnia.     Blood pressure 119/90, pulse (!) 102, temperature 98.4 F (36.9 C), temperature source Oral, resp. rate 16, height 5\' 7"  (1.702 m), weight 68 kg (150 lb).Body mass index is 23.49 kg/m.  General Appearance: Neatly dressed, pleasant, engaging well and cooperative. Appropriate behavior. Not in any distress. Good relatedness. Not internally stimulated.  Eye Contact::  Good  Speech:  Spontaneous, normal prosody. Normal tone and rate.   Volume:  Normal  Mood:  Euthymic  Affect:  Appropriate and Full Range  Thought Process:  Linear  Orientation:  Full (Time, Place, and Person)  Thought Content:  No delusional theme. No preoccupation with violent thoughts. No negative ruminations. No obsession.  No hallucination in any modality.   Suicidal Thoughts:  No  Homicidal Thoughts:  No  Memory:  Immediate;   Good Recent;   Good Remote;   Good  Judgement:  Good  Insight:  Good  Psychomotor Activity:  Normal  Concentration:  Good   Recall:  Good  Fund of Knowledge:Good  Language: Good  Akathisia:  Negative  Handed:    AIMS (if indicated):     Assets:  Communication Skills Desire for Improvement Financial Resources/Insurance Housing Leisure Time Physical Health Resilience Social Support Talents/Skills Transportation Vocational/Educational  Sleep:  Number of Hours: 6.5  Cognition: WNL  ADL's:  Intact   Clinical Assessment::   36 y.o Caucasian, single, works as a Pensions consultanttechnician. Background history of SUD. Presented to the ER via [employer]. His girlfriend called the police because she was worried he was going to kill himself.  They have been arguing a lot. Patient had repeatedly threatened to take his own life. Last week he held a loaded gun to his own head. He is intoxicated with amphetamine and alcohol. BAL 281mg /dl.  Seen today. Says he has been frustrated with continued stay. Comments made were because he has been frustrated. Says that he has no intention to harm himself or any other person. Reports that he is in good spirits. Not feeling depressed. Reports normal energy and interest. Has been maintaining normal biological functions. He is able to think clearly. He is able to focus on task. His thoughts are not crowded or racing. No evidence of mania. No hallucination in any modality. He is not making any delusional statement. No passivity of will/thought. He is fully in touch with reality. No thoughts of suicide. No thoughts of homicide. No violent thoughts. No overwhelming anxiety. All the weapons in his home has been secured.  Nursing staff reports that patient has been appropriate on the unit. Patient has been interacting well with peers. No behavioral issues. Patient has not voiced any suicidal thoughts.  Patient has not been observed to be internally stimulated. Patient has been adherent with treatment recommendations. Patient has been tolerating their medication well.   Patient was discussed at team. Team members  feels that patient is back to his baseline level of function. Team agrees with plan to discharge patient today.   Demographic Factors:  NA  Loss Factors: relational difficulties  Historical Factors: Impulsivity  Risk Reduction Factors:   Sense of responsibility to family, Living with another person, especially a relative, Positive social support, Positive therapeutic relationship and Positive coping skills or problem solving skills  Continued Clinical Symptoms:   as above   Cognitive Features That Contribute To Risk:  None    Suicide Risk:  Minimal: No identifiable suicidal ideation.  Patient is not having any thoughts of suicide at this time. Modifiable risk factors targeted during this admission includes substance use and related mood disorder. Demographical and historical risk factors cannot be modified. Patient is now engaging well. Patient is reliable and is future oriented. We have buffered patient's support structures. At this point, patient is at low risk of suicide. Patient is aware of the effects of psychoactive substances on decision making process. Patient has been provided with emergency contacts. Patient acknowledges to use resources provided if unforseen circumstances changes their current risk stratification.  Follow-up Information    [facility] Follow up on 02/03/2017.   Why:  Hospital follow-up on Thursday at 8:00AM. Please bring: photo ID, medicare card, and hospital discharge paperwork. Thank you.  Contact information: [address] [place] [address] [phone]        [facility] Follow [facility].   Why:  Patient contacted agency prior to admission. Please call to schedule therapy appointment.  Contact information: [address], [address] [place], [place]  [address] Phone: [phone]             Plan Of Care/Follow-up recommendations:  1. Continue current psychotropic medications 2. Mental health and addiction follow up as arranged.  3.  Discharge in care of his mom 4. Provided limited quantity of prescriptions  Georgiann CockerVincent A Izediuno, MD 01/31/2017, 4:12 PM

## 2017-01-31 NOTE — Progress Notes (Signed)
Patient verbalizes readiness for discharge. Follow up plan explained, AVS, transition record and SRA given along with prescriptions. Sample meds provided. All belongings returned. Suicide Safety Plan completed, on chart and copy given to patient. Patient verbalizes understanding. Denies SI/HI and assures this Clinical research associatewriter he will seek assistance should that change. Patient discharged ambulatory and in stable condition to mother.

## 2017-01-31 NOTE — Discharge Summary (Signed)
Physician Discharge Summary Note  Patient:  Bradley DessertBrian A Hyer is an 36 y.o., male MRN:  [id number] DOB:  08/18/1980 Patient phone:  [phone] (home)  Patient address:   [address] [place] [address],  Total Time spent with patient: 30 minutes  Date of Admission:  01/26/2017 Date of Discharge: 01/31/2017  Reason for Admission: Per HPI-36 y.o Caucasian, single, works as a Pensions consultanttechnician. Background history of SUD. Presented to the ER via [employer]. His girlfriend called the police because she was worried he was going to kill himself.  They have been arguing a lot. Patient had repeatedly threatened to take his own life. Last week he held a loaded gun to his own head. He is intoxicated with amphetamine and alcohol. BAL 281mg /dl.  At interview, patient is dismissive of being suicidal. Says he has been with his current girlfriend for five months. Says they were having an argument and she called the police. Patient states that he was cleaning the gun and had it on the table. Says he never had the intention to kill her and kill himself. Says she had her bags packed and was ready to leave. Patient admits to daily use of alcohol. Denies use of amphetamine. Says his girlfriend smokes a lot of it daily. Feels he must have inhaled a lot from her. Patient says he is functioning well at home and at work. Does not feel alcohol is affecting his life. Says he has no desire to quit drinking. Does not feel anti-craving medication would help him. No sweatiness, no headaches. No retching, nausea or vomiting. No fullness in the head. No visual, tactile or auditory hallucination. No internal restlessness. No suicidal or homicidal thoughts. Says he has never had withdrawals in the past. Patient reports that he has a lot of guns in his house.     Principal Problem: Alcohol abuse with alcohol-induced mood disorder [facility](HCC) Discharge Diagnoses: Patient Active Problem List   Diagnosis Date Noted  . Alcohol abuse with  alcohol-induced mood disorder (HCC) [F10.14] 01/26/2017  . Intermittent explosive disorder [F63.81] 01/26/2017    Past Psychiatric History:   Past Medical History: History reviewed. No pertinent past medical history.  Past Surgical History:  Procedure Laterality Date  . arm tendon surgery     Family History: History reviewed. No pertinent family history. Family Psychiatric  History:  Social History:  Social History   Substance and Sexual Activity  Alcohol Use Yes   Comment: occasionally     Social History   Substance and Sexual Activity  Drug Use No    Social History   Socioeconomic History  . Marital status: Single    Spouse name: None  . Number of children: None  . Years of education: None  . Highest education level: None  Social Needs  . Financial resource strain: None  . Food insecurity - worry: None  . Food insecurity - inability: None  . Transportation needs - medical: None  . Transportation needs - non-medical: None  Occupational History  . None  Tobacco Use  . Smoking status: Current Every Day Smoker  . Smokeless tobacco: Never Used  Substance and Sexual Activity  . Alcohol use: Yes    Comment: occasionally  . Drug use: No  . Sexual activity: Yes  Other Topics Concern  . None  Social History Narrative  . None    Hospital Course:  Bradley DessertBrian A Mcphee was admitted for Alcohol abuse with alcohol-induced mood disorder (HCC)  and crisis management.  Pt was treated  discharged with the medications listed below under Medication List.  Medical problems were identified and treated as needed.  Home medications were restarted as appropriate.  Improvement was monitored by observation and [name] 's daily report of symptom reduction.  Emotional and mental status was monitored by daily self-inventory reports completed by [name] and clinical staff.         SADIE PICKAR was evaluated by the treatment team for stability and plans for continued recovery upon  discharge. YESHUA STRYKER 's motivation was an integral factor for scheduling further treatment. Employment, transportation, bed availability, health status, family support, and any pending legal issues were also considered during hospital stay. Pt was offered further treatment options upon discharge including but not limited to Residential, Intensive Outpatient, and Outpatient treatment.  HAILEY MILES will follow up with the services as listed below under Follow Up Information.     Upon completion of this admission the patient was both mentally and medically stable for discharge denying suicidal/homicidal ideation, auditory/visual/tactile hallucinations, delusional thoughts and paranoia.    [name] responded well to treatment with trazodone 50 mg without adverse effects. Pt demonstrated improvement without reported or observed adverse effects to the point of stability appropriate for outpatient management. Pertinent labs include: CMP and  for which outpatient follow-up is necessary for lab recheck as mentioned below. Reviewed CBC, CMP, BAL+ 281, and UDS+ Amphetamines ; all unremarkable aside from noted exceptions.   Physical Findings: AIMS: Facial and Oral Movements Muscles of Facial Expression: None, normal Lips and Perioral Area: None, normal Jaw: None, normal Tongue: None, normal,Extremity Movements Upper (arms, wrists, hands, fingers): None, normal Lower (legs, knees, ankles, toes): None, normal, Trunk Movements Neck, shoulders, hips: None, normal, Overall Severity Severity of abnormal movements (highest score from questions above): None, normal Incapacitation due to abnormal movements: None, normal Patient's awareness of abnormal movements (rate only patient's report): No Awareness, Dental Status Current problems with teeth and/or dentures?: No Does patient usually wear dentures?: No  CIWA:  CIWA-Ar Total: 4 COWS:  COWS Total Score: 2  Musculoskeletal: Strength & Muscle Tone: within  normal limits Gait & Station: normal Patient leans: N/A  Psychiatric Specialty Exam: See SRA by MD Physical Exam  Vitals reviewed. Constitutional: He is oriented to person, place, and time. He appears well-developed.  Cardiovascular: Normal rate.  Neurological: He is alert and oriented to person, place, and time.  Psychiatric: He has a normal mood and affect. His behavior is normal.    Review of Systems  Psychiatric/Behavioral: Negative for depression (stable), hallucinations and suicidal ideas. The patient is not nervous/anxious.     Blood pressure 119/90, pulse (!) 102, temperature 98.4 F (36.9 C), temperature source Oral, resp. rate 16, height 5\' 7"  (1.702 m), weight 68 kg (150 lb).Body mass index is 23.49 kg/m.   Have you used any form of tobacco in the last 30 days? (Cigarettes, Smokeless Tobacco, Cigars, and/or Pipes): Yes  Has this patient used any form of tobacco in the last 30 days? (Cigarettes, Smokeless Tobacco, Cigars, and/or Pipes)  No  Blood Alcohol level:  Lab Results  Component Value Date   ETH 281 (H) 01/25/2017   ETH (H) 07/29/2008    186        LOWEST DETECTABLE LIMIT FOR SERUM ALCOHOL IS 5 mg/dL FOR MEDICAL PURPOSES ONLY    Metabolic Disorder Labs:  No results found for: HGBA1C, MPG No results found for: PROLACTIN No results found for: CHOL, TRIG, HDL,  CHOLHDL, VLDL, [facility]  See Psychiatric Specialty Exam and Suicide Risk Assessment completed by Attending Physician prior to discharge.  Discharge destination:  Home  Is patient on multiple antipsychotic therapies at discharge:  No   Has Patient had three or more failed trials of antipsychotic monotherapy by history:  No  Recommended Plan for Multiple Antipsychotic Therapies: NA  Discharge Instructions    Diet - low sodium heart healthy   Complete by:  As directed    Increase activity slowly   Complete by:  As directed      Allergies as of 01/31/2017   No Known Allergies     Medication  List    STOP taking these medications   ibuprofen 200 MG tablet Commonly known as:  ADVIL,MOTRIN     TAKE these medications     Indication  gabapentin 300 MG capsule Commonly known as:  NEURONTIN Take 1 capsule (300 mg total) by mouth 2 (two) times daily. For withdrawal symptoms  Indication:  Aggressive Behavior, Agitation, Alcohol Withdrawal Syndrome   hydrOXYzine 25 MG tablet Commonly known as:  ATARAX/VISTARIL Take 1 tablet (25 mg total) by mouth 3 (three) times daily as needed for anxiety.  Indication:  Feeling Anxious   traZODone 50 MG tablet Commonly known as:  DESYREL Take 1 tablet (50 mg total) by mouth at bedtime as needed for sleep.  Indication:  Trouble Sleeping      Follow-up Information    [facility] Follow up.   Why:  Hospital follow up appointment will be walk in. Walk in times are Monday through Friday between 8:00am-2:00pm.  Contact information: [address] [place] [address] [phone]        [facility] Follow [facility].   Why:  Patient contacted agency prior to admission. Please call to schedule therapy appointment.  Contact information: [address] [place], [place]  [address] Phone: [phone]             Follow-up recommendations:  Activity:  as tolerated Diet:  heart healthy   Comments:  Take all medications as prescribed. Keep all follow-up appointments as scheduled.  Do not consume alcohol or use illegal drugs while on prescription medications. Report any adverse effects from your medications to your primary care provider promptly.  In the event of recurrent symptoms or worsening symptoms, call 911, a crisis hotline, or go to the nearest emergency department for evaluation.   Signed: Oneta Rackanika N Bret Stamour, NP 01/31/2017, 3:48 PM

## 2017-01-31 NOTE — Plan of Care (Signed)
Patient verbalizes understanding of information, education provided to patient.

## 2017-01-31 NOTE — Progress Notes (Signed)
Patient ID: Blima DessertBrian A Kirchgessner, male   DOB: June 10, 1980, 36 y.o.   MRN: [id number] PER STATE REGULATIONS 482.30  THIS CHART WAS REVIEWED FOR MEDICAL NECESSITY WITH RESPECT TO THE PATIENT'S ADMISSION/ DURATION OF STAY.  NEXT REVIEW DATE: 02/03/2017  Willa RoughJENNIFER JONES Goro Wenrick, RN, BSN CASE MANAGER

## 2017-01-31 NOTE — [facility] Suicide Risk Assessment (Signed)
[facility] INPATIENT:  Family/Significant Other Suicide Prevention Education  Suicide Prevention Education:  Education Completed; [name] (pt's girlfriend) [phone] has been identified by the patient as the family member/significant other with whom the patient will be residing, and identified as the person(s) who will aid the patient in the event of a mental health crisis (suicidal ideations/suicide attempt).  With written consent from the patient, the family member/significant other has been provided the following suicide prevention education, prior to the and/or following the discharge of the patient.  The suicide prevention education provided includes the following:  Suicide risk factors  Suicide prevention and interventions  National Suicide Hotline telephone number  [facility] assessment telephone number  [facility] Emergency Assistance 911  [facility] and/or Residential Mobile Crisis Unit telephone number  Request made of family/significant other to:  Remove weapons (e.g., guns, rifles, knives), all items previously/currently identified as safety concern.    Remove drugs/medications (over-the-counter, prescriptions, illicit drugs), all items previously/currently identified as a safety concern.  The family member/significant other verbalizes understanding of the suicide prevention education information provided.  The family member/significant other agrees to remove the items of safety concern listed above.  Pt's girlfriend reports that "as long as he has follow-up in place, he can come home." Guns have been removed from the home by pt's parents. SPE and aftercare reviewed. She reports that if she feels unsafe at any point, she has a safe place to go and is agreeable to patient returning home. She is notifying pt's mother who will likely pick him up after 5:00PM today. Per MD, pt can discharge today. Pt's RN [name] notified of above.   Trula SladeHeather Smart, MSW,  LCSW Clinical Social Worker 01/31/2017 3:45 PM

## 2018-10-13 ENCOUNTER — Other Ambulatory Visit: Payer: Self-pay

## 2018-10-13 DIAGNOSIS — Z20822 Contact with and (suspected) exposure to covid-19: Secondary | ICD-10-CM

## 2018-10-15 LAB — NOVEL CORONAVIRUS, NAA: SARS-CoV-2, NAA: NOT DETECTED

## 2019-06-01 ENCOUNTER — Ambulatory Visit: Payer: Self-pay | Attending: Internal Medicine

## 2019-06-01 DIAGNOSIS — Z23 Encounter for immunization: Secondary | ICD-10-CM

## 2019-06-01 NOTE — Progress Notes (Signed)
   Covid-19 Vaccination Clinic  Name:  [name]    MRN: [id number] DOB: [birth year]  06/01/2019  [name] was observed post Covid-19 immunization for 15 minutes without incident. He was provided with Vaccine Information Sheet and instruction to access the V-Safe system.   [name] was instructed to call 911 with any severe reactions post vaccine: Marland Kitchen Difficulty breathing  . Swelling of face and throat  . A fast heartbeat  . A bad rash all over body  . Dizziness and weakness   Immunizations Administered    Name Date Dose VIS Date Route   Pfizer COVID-19 Vaccine 06/01/2019  4:03 PM 0.3 mL 02/16/2019 Intramuscular   Manufacturer: [employer], [employer]   Lot: [id number]   NDC: [address]-4

## 2019-06-26 ENCOUNTER — Ambulatory Visit: Payer: Self-pay | Attending: Internal Medicine

## 2019-06-26 DIAGNOSIS — Z23 Encounter for immunization: Secondary | ICD-10-CM

## 2019-06-26 NOTE — Progress Notes (Signed)
   Covid-19 Vaccination Clinic  Name:  AVYON HERENDEEN    MRN: [id number] DOB: May 27, 1980  06/26/2019  Mr. Skibicki was observed post Covid-19 immunization for 15 minutes without incident. He was provided with Vaccine Information Sheet and instruction to access the V-Safe system.   Mr. Snarski was instructed to call 911 with any severe reactions post vaccine: Marland Kitchen Difficulty breathing  . Swelling of face and throat  . A fast heartbeat  . A bad rash all over body  . Dizziness and weakness   Immunizations Administered    Name Date Dose VIS Date Route   Pfizer COVID-19 Vaccine 06/26/2019  4:20 PM 0.3 mL 05/02/2018 Intramuscular   Manufacturer: [employer], [employer]   Lot: [id number]   NDC: [address]-3

## 2020-10-08 ENCOUNTER — Ambulatory Visit: Payer: Self-pay | Admitting: Emergency Medicine

## 2021-04-01 ENCOUNTER — Encounter (HOSPITAL_COMMUNITY): Payer: Self-pay | Admitting: Licensed Clinical Social Worker

## 2021-04-01 ENCOUNTER — Ambulatory Visit (HOSPITAL_COMMUNITY): Payer: No Payment, Other | Admitting: Licensed Clinical Social Worker

## 2021-04-01 ENCOUNTER — Other Ambulatory Visit: Payer: Self-pay

## 2021-04-01 DIAGNOSIS — F4310 Post-traumatic stress disorder, unspecified: Secondary | ICD-10-CM

## 2021-04-01 DIAGNOSIS — F1994 Other psychoactive substance use, unspecified with psychoactive substance-induced mood disorder: Secondary | ICD-10-CM | POA: Insufficient documentation
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
## 2021-04-01 NOTE — Plan of Care (Signed)
Pt agreeable to plan  ?

## 2021-05-13 ENCOUNTER — Ambulatory Visit (HOSPITAL_COMMUNITY): Payer: No Payment, Other | Admitting: Licensed Clinical Social Worker

## 2021-05-13 ENCOUNTER — Other Ambulatory Visit: Payer: Self-pay

## 2021-05-13 DIAGNOSIS — F3162 Bipolar disorder, current episode mixed, moderate: Secondary | ICD-10-CM | POA: Insufficient documentation

## 2021-05-13 NOTE — Progress Notes (Signed)
? ?  THERAPIST PROGRESS NOTE ? ?Session Time: 40 ? ?Participation Level: Active ? ?Behavioral Response: CasualAlertAnxious and Depressed ? ?Type of Therapy: Individual Therapy ? ?Treatment Goals addressed: Patient will score less than 5 on the Generalized Anxiety Disorder 7 Scale  ?(GAD-7) ? ?ProgressTowards Goals: Progressing ? ?Interventions: CBT and Supportive ? ?Summary: [name] is a 41 y.o. male who presents with anxious and depressed mood\affect.  Patient was pleasant, cooperative, and maintained good eye contact.  He engaged well in therapy session and was alert and oriented x5. ? Primary stressors are anxiety and depression.  Patient endorses symptoms for paranoia, agoraphobia, rapid thoughts, insomnia, tension, and worry.  Patient reports history of bipolar disorder, seasonal depression, and substance-induced mood disorder.  Patient reports that he has not been drinking alcohol in the past 7 days.  Patient reports that he will binge alcohol at certain points in time.  This is to help decrease his anxiety.  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  Patient reports that he has not been able to maintain employment in several years due to his inability to get along with others.  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 ? ?Suicidal/Homicidal: Nowithout intent/plan ? ?Therapist Response:  ? ? LCSW administered a PHQ-9.  Patient increased score by 4 points in 1 month.  LCSW administered the GAD-7 and patient decreased his score by 2 points scoring a 16 in today's session.  Goal and objective for both is to score below a 5.  Patient is also to engage in 80% of assigned homework.  In today's session patient was assigned first homework assignment for administering meditation prior to bed to help improve sleep quality.  Patient reporting getting 4 to 6 hours of sleep per night but usually only 2 to 3 hours of sleep before rapid thoughts wake him up.  Patient has a medication  management appointment in 1 week patient to attend that prior to next session with this LCSW. ?Plan: Return again in 4 weeks. ? ?Diagnosis: Bipolar 1 disorder, mixed, moderate (HCC) ? ?Collaboration of Care: Other Referral to medication mgnt    ? ?Patient/Guardian was advised Release of Information must be obtained prior to any record release in order to collaborate their care with an outside provider. Patient/Guardian was advised if they have not already done so to contact the registration department to sign all necessary forms in order for [place] to release information regarding their care.  ? ?Consent: Patient/Guardian gives verbal consent for treatment and assignment of benefits for services provided during this visit. Patient/Guardian expressed understanding and agreed to proceed.  ? ?Weber Cooks, LCSW ?05/13/2021 ? ?

## 2021-05-13 NOTE — Plan of Care (Signed)
Pt has dcreased GAD-7 by 2 points. Pt has been assigned using meditation video "daily calm" prior to bed. Pt increased PHQ-9 by 4 points  ?

## 2021-05-21 ENCOUNTER — Encounter (HOSPITAL_COMMUNITY): Payer: Self-pay | Admitting: Student in an Organized Health Care Education/Training Program

## 2021-05-21 ENCOUNTER — Ambulatory Visit (INDEPENDENT_AMBULATORY_CARE_PROVIDER_SITE_OTHER): Payer: No Payment, Other | Admitting: Student in an Organized Health Care Education/Training Program

## 2021-05-21 ENCOUNTER — Other Ambulatory Visit: Payer: Self-pay

## 2021-05-21 VITALS — BP 158/103 | HR 100 | Ht 67.0 in | Wt 159.0 lb

## 2021-05-21 DIAGNOSIS — G4700 Insomnia, unspecified: Secondary | ICD-10-CM

## 2021-05-21 DIAGNOSIS — F411 Generalized anxiety disorder: Secondary | ICD-10-CM | POA: Diagnosis not present

## 2021-05-21 DIAGNOSIS — F429 Obsessive-compulsive disorder, unspecified: Secondary | ICD-10-CM | POA: Diagnosis not present

## 2021-05-21 DIAGNOSIS — F4312 Post-traumatic stress disorder, chronic: Secondary | ICD-10-CM | POA: Diagnosis not present

## 2021-05-21 DIAGNOSIS — I10 Essential (primary) hypertension: Secondary | ICD-10-CM

## 2021-05-21 MED ORDER — QUETIAPINE FUMARATE 100 MG PO TABS: 100.0000 mg | ORAL_TABLET | Freq: Every day | ORAL | 0 refills | Status: DC

## 2021-05-21 MED ORDER — PROPRANOLOL HCL 10 MG PO TABS: 10.0000 mg | ORAL_TABLET | Freq: Two times a day (BID) | ORAL | 0 refills | Status: DC

## 2021-05-21 MED ORDER — PAROXETINE HCL 10 MG PO TABS: 10.0000 mg | ORAL_TABLET | Freq: Every day | ORAL | 0 refills | Status: DC
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
## 2021-06-03 ENCOUNTER — Encounter (HOSPITAL_COMMUNITY): Payer: Self-pay

## 2021-06-03 ENCOUNTER — Ambulatory Visit (HOSPITAL_COMMUNITY): Payer: No Payment, Other | Admitting: Licensed Clinical Social Worker

## 2021-06-03 DIAGNOSIS — F3162 Bipolar disorder, current episode mixed, moderate: Secondary | ICD-10-CM

## 2021-06-03 NOTE — Plan of Care (Signed)
?  Problem: Anxiety Disorder CCP Problem  1 Substance use mood disorder  ?Goal: Decrease PHQ-9 below 5  ?Outcome: Progressing ?Goal: LTG: Patient will score less than 5 on the Generalized Anxiety Disorder 7 Scale (GAD-7) ?Outcome: Not Progressing ?Goal: STG: Patient will complete at least 80% of assigned homework ?Outcome: Progressing ?Goal: STG: Patient will attend couples/family counseling sessions with partner/family member 1x per week for the next 4 weeks ?Outcome: Not Progressing ?Goal: STG: Patient will practice problem solving skills 3 times per week for the next 4 weeks ?Outcome: Progressing ?  ?

## 2021-06-03 NOTE — Progress Notes (Signed)
? ?  THERAPIST PROGRESS NOTE ? ?Session Time: 40 ? ?Participation Level: Active ? ?Behavioral Response: CasualAlertAnxious and Depressed ? ?Type of Therapy: Individual Therapy ? ?Treatment Goals addressed:  ? ?ProgressTowards Goals: Progressing ? ?Interventions: CBT, Motivational Interviewing, Solution Focused, and Supportive ? ?Summary: [name] is a 41 y.o. male who presents with depressed and anxious mood\affect.  Patient was pleasant, cooperative, maintained good eye contact.  He engaged well in therapy session was dressed casually. ? Patient reports primary stressors are anxiety, depression, and paranoia.  Patient reports that he is still struggling with social anxiety.  Patient reports he fears going into grocery stores and having people around him.  Patient reports that same level of anxiety even in the household and in controlled environments.  Patient reports that he has video cameras all around his house and has at least 47 guns.  Patient reports that he does not concealed carry at this time but will occasionally open carry in the store.  Patient reports that he has to double check the locks and doors multiple times per night to make sure the house is secure.  Patient states that some of his tension and anxiety come from the news and seeing the negative things that are going around in the world ? ?Suicidal/Homicidal: Nowithout intent/plan ? ?Therapist Response:  ? ? Intervention/Plan: LCSW utilized interventions for GAD-7 and PHQ-9 administered in today's session.  Patient increase GAD-7 score by one-point from a 16 to a 17 goal\objective for GAD-7 is below a 5.  LCSW notes that patient went from a 13 to a 6 for his PHQ-9 patient's goal/objective was a 10.  LCSW spoke and educated patient on the "5, 4, 3, 2, 1 grounding technique".  Patient to utilize that at least 1 time per week.  Patient also to utilize checklist for securing household writing down what he did what time he did at and signing the  paper so that patient is aware when and who did the action. ? ?Plan: Return again in 4 weeks. ? ?Diagnosis: No diagnosis found. ? ?Collaboration of Care: Other   ? ?Patient/Guardian was advised Release of Information must be obtained prior to any record release in order to collaborate their care with an outside provider. Patient/Guardian was advised if they have not already done so to contact the registration department to sign all necessary forms in order for [place] to release information regarding their care.  ? ?Consent: Patient/Guardian gives verbal consent for treatment and assignment of benefits for services provided during this visit. Patient/Guardian expressed understanding and agreed to proceed.  ? ?Weber Cooks, LCSW ?06/03/2021 ? ?

## 2021-06-11 ENCOUNTER — Ambulatory Visit (INDEPENDENT_AMBULATORY_CARE_PROVIDER_SITE_OTHER): Payer: No Payment, Other | Admitting: Student in an Organized Health Care Education/Training Program

## 2021-06-11 ENCOUNTER — Encounter (HOSPITAL_COMMUNITY): Payer: Self-pay | Admitting: Student in an Organized Health Care Education/Training Program

## 2021-06-11 VITALS — BP 154/112 | HR 96 | Ht 67.0 in | Wt 163.0 lb

## 2021-06-11 DIAGNOSIS — I10 Essential (primary) hypertension: Secondary | ICD-10-CM | POA: Diagnosis not present

## 2021-06-11 DIAGNOSIS — F411 Generalized anxiety disorder: Secondary | ICD-10-CM | POA: Diagnosis not present

## 2021-06-11 DIAGNOSIS — G4700 Insomnia, unspecified: Secondary | ICD-10-CM

## 2021-06-11 DIAGNOSIS — F429 Obsessive-compulsive disorder, unspecified: Secondary | ICD-10-CM

## 2021-06-11 DIAGNOSIS — F4312 Post-traumatic stress disorder, chronic: Secondary | ICD-10-CM | POA: Diagnosis not present

## 2021-06-11 MED ORDER — PROPRANOLOL HCL 20 MG PO TABS
20.0000 mg | ORAL_TABLET | Freq: Two times a day (BID) | ORAL | 0 refills | Status: DC
Start: 2021-06-11 — End: 2021-06-25

## 2021-06-11 MED ORDER — QUETIAPINE FUMARATE 200 MG PO TABS: 200.0000 mg | ORAL_TABLET | Freq: Every day | ORAL | 0 refills | Status: DC

## 2021-06-11 MED ORDER — PAROXETINE HCL 10 MG PO TABS: 10.0000 mg | ORAL_TABLET | Freq: Every day | ORAL | 0 refills | Status: DC
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
## 2021-06-17 ENCOUNTER — Telehealth (HOSPITAL_COMMUNITY): Payer: Self-pay | Admitting: *Deleted

## 2021-06-17 NOTE — Telephone Encounter (Signed)
Received three fax requests from his pharmacy for his meds. Called to question requests as we had just seen him at the clinic and meds sent in on 06/11/21. Pharmacy confirms his medicines are there and he has not picked them up. Could be he phoned in a refill but as there are no refills system may not have caught the new rxs available. Pharmacy will call and text him to pick up his 3 rxs. ?

## 2021-06-25 ENCOUNTER — Encounter (HOSPITAL_COMMUNITY): Payer: Self-pay | Admitting: Student in an Organized Health Care Education/Training Program

## 2021-06-25 ENCOUNTER — Ambulatory Visit (INDEPENDENT_AMBULATORY_CARE_PROVIDER_SITE_OTHER): Payer: No Payment, Other | Admitting: Student in an Organized Health Care Education/Training Program

## 2021-06-25 VITALS — BP 155/117 | HR 105 | Ht 67.0 in | Wt 158.0 lb

## 2021-06-25 DIAGNOSIS — G4700 Insomnia, unspecified: Secondary | ICD-10-CM

## 2021-06-25 DIAGNOSIS — F429 Obsessive-compulsive disorder, unspecified: Secondary | ICD-10-CM | POA: Diagnosis not present

## 2021-06-25 DIAGNOSIS — F4310 Post-traumatic stress disorder, unspecified: Secondary | ICD-10-CM

## 2021-06-25 DIAGNOSIS — I10 Essential (primary) hypertension: Secondary | ICD-10-CM | POA: Diagnosis not present

## 2021-06-25 DIAGNOSIS — F411 Generalized anxiety disorder: Secondary | ICD-10-CM | POA: Diagnosis not present

## 2021-06-25 MED ORDER — PROPRANOLOL HCL 20 MG PO TABS: 20.0000 mg | ORAL_TABLET | Freq: Two times a day (BID) | ORAL | 0 refills | Status: DC

## 2021-06-25 MED ORDER — PAROXETINE HCL 20 MG PO TABS: 20.0000 mg | ORAL_TABLET | Freq: Every day | ORAL | 0 refills | Status: DC

## 2021-06-25 MED ORDER — QUETIAPINE FUMARATE 300 MG PO TABS: 300.0000 mg | ORAL_TABLET | Freq: Every day | ORAL | 0 refills | Status: DC
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
## 2021-07-07 ENCOUNTER — Ambulatory Visit (INDEPENDENT_AMBULATORY_CARE_PROVIDER_SITE_OTHER): Payer: No Payment, Other | Admitting: Licensed Clinical Social Worker

## 2021-07-07 DIAGNOSIS — F4310 Post-traumatic stress disorder, unspecified: Secondary | ICD-10-CM

## 2021-07-07 DIAGNOSIS — F4000 Agoraphobia, unspecified: Secondary | ICD-10-CM | POA: Insufficient documentation

## 2021-07-07 DIAGNOSIS — F411 Generalized anxiety disorder: Secondary | ICD-10-CM | POA: Insufficient documentation

## 2021-07-07 NOTE — Plan of Care (Signed)
?  Problem: Anxiety Disorder CCP Problem  1 Substance use mood disorder  ?Goal: Decrease PHQ-9 below 5  ?Outcome: Progressing ?Goal: LTG: Patient will score less than 5 on the Generalized Anxiety Disorder 7 Scale (GAD-7) ?Outcome: Not Progressing ?Goal: STG: Patient will complete at least 80% of assigned homework ?Outcome: Progressing ?Goal: STG: Patient will attend couples/family counseling sessions with partner/family member 1x per week for the next 4 weeks ?Outcome: Not Progressing ?Goal: STG: Patient will practice problem solving skills 3 times per week for the next 4 weeks ?Outcome: Progressing ?Intervention: Discuss risks and benefits of medication treatment options for this problem and prescribe as indicated ?Intervention: Encourage patient to take psychotropic medication as prescribed ?Intervention: Review results of GAD-7 with the patient to track progress ?Intervention: Work with patient to track symptoms, triggers and/or skill use through a mood chart, diary card, or journal ?Intervention: Perform psychoeducation regarding anxiety disorders ?  ?

## 2021-07-07 NOTE — Progress Notes (Signed)
? ?  THERAPIST PROGRESS NOTE ? ?Session Time: 30 ? ?Participation Level: Active ? ?Behavioral Response: CasualAlertAnxious and Depressed ? ?Type of Therapy: Individual Therapy ? ?Treatment Goals addressed: decrease PHQ-9 below 10.  ? ?ProgressTowards Goals: Progressing ? ?Interventions: CBT, Motivational Interviewing, and Supportive ? ?Summary: [name] is a 41 y.o. male who presents with   anxious mood\affect.  Patient was pleasant, cooperative, maintained good eye contact.  He engaged well in therapy session was dressed casually. ? Patient's primary stressors are anxiety driven for social anxiety and agoraphobia.  Patient reports that he still has a fear of going outside of his home and does not like to meet new people.  Patient reports primary stressors are things such as going to the grocery store and people standing too close to him.  Patient reports that he has been getting better sleep with his medication that is prescribed to him through [facility] for Seroquel.  Patient reports no differences in anxiety medications or behaviors. ? ?Suicidal/Homicidal: Nowithout intent/plan ? ?Therapist Response:  ? ?  ? Intervention/Plan: LCSW administered GAD-7.  LCSW administered the PHQ-9.  LCSW notes progression in PHQ-9 and regression in GAD-7.  LCSW spoke with patient and educated him on grounding techniques for "5, 4, 3, 2, 1 technique" and previous session patient reports no different utilizing this technique.  Patient reports that he is open to group therapy moving forward for exposure therapy to social settings in a controlled alignment.  LCSW referred patient to [facility] for support groups. ?Plan: Return again in 4 weeks. ? ?Diagnosis: PTSD (post-traumatic stress disorder) ? ?GAD (generalized anxiety disorder) ? ?Agoraphobia ? ?Collaboration of Care: Other None in today's session.  ? ?Patient/Guardian was advised Release of Information must be obtained prior to any record  release in order to collaborate their care with an outside provider. Patient/Guardian was advised if they have not already done so to contact the registration department to sign all necessary forms in order for [place] to release information regarding their care.  ? ?Consent: Patient/Guardian gives verbal consent for treatment and assignment of benefits for services provided during this visit. Patient/Guardian expressed understanding and agreed to proceed.  ? ?Dory Horn, LCSW ?07/07/2021 ? ?

## 2021-07-30 ENCOUNTER — Encounter (HOSPITAL_COMMUNITY): Payer: No Payment, Other | Admitting: Student in an Organized Health Care Education/Training Program

## 2021-07-30 ENCOUNTER — Encounter (HOSPITAL_COMMUNITY): Payer: Self-pay | Admitting: Student in an Organized Health Care Education/Training Program

## 2021-07-30 ENCOUNTER — Ambulatory Visit (INDEPENDENT_AMBULATORY_CARE_PROVIDER_SITE_OTHER): Payer: No Payment, Other | Admitting: Student in an Organized Health Care Education/Training Program

## 2021-07-30 VITALS — BP 164/109 | HR 95 | Ht 67.0 in | Wt 160.0 lb

## 2021-07-30 DIAGNOSIS — F411 Generalized anxiety disorder: Secondary | ICD-10-CM

## 2021-07-30 DIAGNOSIS — G4700 Insomnia, unspecified: Secondary | ICD-10-CM

## 2021-07-30 DIAGNOSIS — F429 Obsessive-compulsive disorder, unspecified: Secondary | ICD-10-CM | POA: Diagnosis not present

## 2021-07-30 DIAGNOSIS — F4310 Post-traumatic stress disorder, unspecified: Secondary | ICD-10-CM | POA: Diagnosis not present

## 2021-07-30 DIAGNOSIS — I10 Essential (primary) hypertension: Secondary | ICD-10-CM

## 2021-07-30 MED ORDER — PROPRANOLOL HCL 20 MG PO TABS: 30.0000 mg | ORAL_TABLET | Freq: Two times a day (BID) | ORAL | 0 refills | Status: DC

## 2021-07-30 MED ORDER — PAROXETINE HCL 30 MG PO TABS
30.0000 mg | ORAL_TABLET | Freq: Every day | ORAL | 0 refills | Status: DC
Start: 2021-07-30 — End: 2021-09-03

## 2021-07-30 MED ORDER — QUETIAPINE FUMARATE 300 MG PO TABS: 300.0000 mg | ORAL_TABLET | Freq: Every day | ORAL | 0 refills | Status: DC

## 2021-07-30 NOTE — Progress Notes (Signed)
BH MD/PA/NP OP Progress Note  07/30/2021 2:43 PM [name]  MRN:  [id number]  Chief Complaint:  Chief Complaint  Patient presents with   Medication Management    in person, f/u mm   HPI:  [name] is a 41 yr old male who presents for follow up and continued medication management.  PPHx is significant for GAD, OCD, Insomnia, Chronic PTSD, EtOH Use Disorder, Bipolar Disorder, SAD, and ADHD, 1 Suicide Attempt (cut self with knife, teenager), past hospitalization (latest [facility] 2018).  On interview today [name] reports that he has had improvement in his symptoms since our last appointment.  He states that his anxiety has improved to the point where he is able to walk around his neighborhood without issue.  He reports that when he goes to the grocery store if someone walks down the aisle past him while still having some spike in anxiety he is not as disturbed by it.  He reports his anxiety has led up to the point where he has begun to work again.  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  He then showed me some lab results written on a piece paper.  He reports that his mother who is a nurse wanted me to discuss these results with him.  It was AST and ALT values of 61.  Discussed with him that these values while slightly elevated are not necessarily concerning.  Discussed that this would be something that would need to be followed up with PCP.  When asked why he got blood work done he states that he has been having these episodes of severe tiredness where he will be unable to do much for the day and that this has been happening about once a week or so.  Discussed with him the need to make an appointment with the PCP.  He reports he still has not done this yet.  Discussed that I would provide him the information for this at discharge.  Discussed the importance of him establishing care as his father had his first MI (total of 7) at the age Reef currently  is.  He reports that his sleep has significantly improved since our last appointment.  He reports that he is now currently getting a good 8 hours just about every night.  He reports his appetite is doing good.  He reports no SI, HI, or AVH.  He reports no [place], Ideas of Reference, or other First Rank symptoms.  He reports that his guns are locked up and with the ammo stored separately.  He reports no other concerns at present.   Visit Diagnosis:    ICD-10-CM   1. PTSD (post-traumatic stress disorder)  F43.10     2. GAD (generalized anxiety disorder)  F41.1 PARoxetine (PAXIL) 30 MG tablet    propranolol (INDERAL) 20 MG tablet    3. Obsessive-compulsive disorder, unspecified type  F42.9 PARoxetine (PAXIL) 30 MG tablet    4. Essential hypertension  I10 propranolol (INDERAL) 20 MG tablet    5. Insomnia, unspecified type  G47.00 QUEtiapine (SEROQUEL) 300 MG tablet      Past Psychiatric History: GAD, OCD, Insomnia, Chronic PTSD, EtOH Use Disorder, Bipolar Disorder, SAD, and ADHD, 1 Suicide Attempt (cut self with knife, teenager), past hospitalization (latest [facility] 2018).  Past Medical History: No past medical history on file.  Past Surgical History:  Procedure Laterality Date   arm tendon surgery  Family Psychiatric History: Father- Cocaine Use No Known Diagnosis' or Suicides  Family History: No family history on file.  Social History:  Social History   Socioeconomic History   Marital status: Single    Spouse name: Not on file   Number of children: Not on file   Years of education: Not on file   Highest education level: Not on file  Occupational History   Not on file  Tobacco Use   Smoking status: Every Day    Packs/day: 0.50    Types: Cigarettes   Smokeless tobacco: Never  Substance and Sexual Activity   Alcohol use: Yes    Alcohol/week: 21.0 standard drinks    Types: 21 Cans of beer per week   Drug use: No   Sexual activity: Not Currently  Other Topics Concern    Not on file  Social History Narrative   Not on file   Social Determinants of Health   Financial Resource Strain: High Risk   Difficulty of Paying Living Expenses: Hard  Food Insecurity: No Food Insecurity   Worried About Running Out of Food in the Last Year: Never true   Ran Out of Food in the Last Year: Never true  Transportation Needs: No Transportation Needs   Lack of Transportation (Medical): No   Lack of Transportation (Non-Medical): No  Physical Activity: Sufficiently Active   Days of Exercise per Week: 7 days   Minutes of Exercise per Session: 150+ min  Stress: Stress Concern Present   Feeling of Stress : Very much  Social Connections: Socially Isolated   Frequency of Communication with Friends and Family: More than three times a week   Frequency of Social Gatherings with Friends and Family: Twice a week   Attends Religious Services: Never   Database administrator or Organizations: No   Attends Engineer, structural: Never   Marital Status: Never married    Allergies: No Known Allergies  Metabolic Disorder Labs: No results found for: HGBA1C, MPG No results found for: PROLACTIN No results found for: CHOL, TRIG, HDL, CHOLHDL, VLDL, LDLCALC No results found for: TSH  Therapeutic Level Labs: No results found for: LITHIUM No results found for: VALPROATE No components found for:  CBMZ  Current Medications: Current Outpatient Medications  Medication Sig Dispense Refill   PARoxetine (PAXIL) 30 MG tablet Take 1 tablet (30 mg total) by mouth daily. 30 tablet 0   propranolol (INDERAL) 20 MG tablet Take 1.5 tablets (30 mg total) by mouth 2 (two) times daily. 90 tablet 0   QUEtiapine (SEROQUEL) 300 MG tablet Take 1 tablet (300 mg total) by mouth at bedtime. 30 tablet 0   No current facility-administered medications for this visit.     Musculoskeletal: Strength & Muscle Tone: within normal limits Gait & Station: normal Patient leans: N/A  Psychiatric  Specialty Exam: Review of Systems  Respiratory:  Negative for chest tightness and shortness of breath.   Cardiovascular:  Negative for chest pain.  Gastrointestinal:  Negative for abdominal pain, constipation, diarrhea, nausea and vomiting.  Psychiatric/Behavioral:  Negative for agitation, dysphoric mood, hallucinations, sleep disturbance and suicidal ideas. The patient is nervous/anxious.    Blood pressure (!) 164/109, pulse 95, height 5\' 7"  (1.702 m), weight 160 lb (72.6 kg).Body mass index is 25.06 kg/m.  General Appearance: Casual and Fairly Groomed  Eye Contact:  Good  Speech:  Clear and Coherent and Normal Rate  Volume:  Normal  Mood:  Anxious  Affect:  Congruent  Thought Process:  Coherent  Orientation:  Full (Time, Place, and Person)  Thought Content: Logical   Suicidal Thoughts:  No  Homicidal Thoughts:  No  Memory:  Immediate;   Good Recent;   Good  Judgement:  Fair  Insight:  Fair  Psychomotor Activity:  Normal  Concentration:  Concentration: Fair and Attention Span: Fair  Recall:  Good  Fund of Knowledge: Good  Language: Good  Akathisia:  Negative  Handed:  Right  AIMS (if indicated): done AIMS=0 No cogwheeling or Rigidity present  Assets:  Communication Skills Desire for Improvement Financial Resources/Insurance Housing Resilience Social Support  ADL's:  Intact  Cognition: WNL  Sleep:  Good   Screenings: AIMS    Flowsheet Row [facility]  AIMS Total Score 0      AUDIT    Flowsheet Row Counselor from [facility]01/2021 [facility]  Alcohol Use Disorder Identification Test Final Score (AUDIT) 13 9      GAD-7    Flow[facility]  [facility]  Total GAD-7 Score 18 17 16 18       PHQ2-9    Flowsheet Row [facility]  PHQ-2 Total Score 1 2 1 4   PHQ-9 Total Score 3 6 13 9       Flowsheet Row [facility]  C-SSRS RISK CATEGORY No Risk        Assessment and Plan:  Arlys JohnBrian has tolerated the increase in Paxil, Seroquel, and propranolol well.  He reports that his anxiety is improving to the point where he is able to do things out of the house without significant distress and has even begun working again.  Due to his sleep being significantly improved to the point of consistently getting 8 hours a night we will not increase his Seroquel at this time.  To further control his anxiety we will increase his Paxil.  To further control his anxiety and hypertension we will also increase his propranolol at this time.  I have given him information for PCP clinic so that he can make an appointment.  He will return to the office in approximately 4 weeks.    OCD  GAD  Chronic PTSD  Bipolar Disorder by Hx : -Increase Paxil to 30 mg daily for anxiety and OCD -Increase Propanolol to 30 mg BID for anxiety and HTN -Continue Seroquel 300 mg for QHS for mood stability and insomnia   Collaboration of Care: Case Discussed with Supervising Physician Dr. Lucianne MussKumar  Patient/Guardian was advised Release of Information must be obtained prior to any record release in order to collaborate their care with an outside provider. Patient/Guardian was advised if they have not already done so to contact the registration  department to sign all necessary forms in order for us to release information regarding their care.   Consent: Patient/Guardian gives verbal consent for treatment and assignment of benefits for services provided during this visit. Patient/Guardian expressed understanding and agreed to proceed.    Lyn HollingsheadAlexander  Camila Li, MD 07/30/2021, 2:43 PM

## 2021-08-04 ENCOUNTER — Ambulatory Visit (INDEPENDENT_AMBULATORY_CARE_PROVIDER_SITE_OTHER): Payer: No Payment, Other | Admitting: Licensed Clinical Social Worker

## 2021-08-04 DIAGNOSIS — F4310 Post-traumatic stress disorder, unspecified: Secondary | ICD-10-CM

## 2021-08-04 DIAGNOSIS — F411 Generalized anxiety disorder: Secondary | ICD-10-CM

## 2021-08-04 NOTE — Progress Notes (Signed)
   THERAPIST PROGRESS NOTE  Session Time: 80  Participation Level: Active  Behavioral Response: CasualAlertAnxious and Depressed  Type of Therapy: Individual Therapy  Treatment Goals addressed: decrease GAD-7 below 5   ProgressTowards Goals: Progressing  Interventions: CBT and Motivational Interviewing  Summary: [name] is a 41 y.o. male who presents with depressed and anxious mood\affect.  Patient presented as pleasant, cooperative, maintained good eye contact.  He engaged well in therapy session was dressed casually.  [name] was alert and oriented x5.  Patient reports that anxiety and depression have decreased since last session.  Patient reports taking medications without labs over the past 4 weeks.  Patient reports getting more improved sleep, less agoraphobia, and decreased paranoia.  Patient reports decreased paranoia as evidenced by decreasing the amount of times he will take firearms out into the public.  Patient reports that he has also been going into stores as well as other agencies without anxiety example provided his [facility] patient reports decreased anxiety today.  Patient reports anxiety at worst being a 10 and it is currently at a 7.  Patient attributes this to increased sleep, increased work, and taking medications regularly.  Suicidal/Homicidal: Nowithout intent/plan  Therapist Response:    Intervention/Plan: LCSW administered GAD-7.  Goal\objective is below a 5.  Patient currently scoring a 6.  Plan for patient is to score below a 10 over the next 3 months with the overall goal being decreasing below a 5 in total.  LCSW supportive language such as praise and encouragement.  LCSW engaged with patient on utilizing coping skills such as running and utilizing medications as prescribed.  LCSW advised patient to speak with RN staff at [facility] to possibly decrease Seroquel due to drowsiness in the mornings.      Plan: Return again in 4 weeks.  Diagnosis: No diagnosis found.  Collaboration of Care: Other None today.   Patient/Guardian was advised Release of Information must be obtained prior to any record release in order to collaborate their care with an outside provider. Patient/Guardian was advised if they have not already done so to contact the registration department to sign all necessary forms in order for [place] to release information regarding their care.   Consent: Patient/Guardian gives verbal consent for treatment and assignment of benefits for services provided during this visit. Patient/Guardian expressed understanding and agreed to proceed.   Weber Cooks, LCSW 08/04/2021

## 2021-08-04 NOTE — Plan of Care (Signed)
  Problem: Anxiety Disorder CCP Problem  1 Substance use mood disorder  Goal: LTG: Patient will score less than 5 on the Generalized Anxiety Disorder 7 Scale (GAD-7) Outcome: Progressing Goal: STG: Patient will complete at least 80% of assigned homework Outcome: Progressing Goal: STG: Patient will practice problem solving skills 3 times per week for the next 4 weeks Outcome: Progressing   Problem: Anxiety Disorder CCP Problem  1 Substance use mood disorder  Goal: Decrease PHQ-9 below 5  Outcome: Not Progressing Goal: STG: Patient will attend couples/family counseling sessions with partner/family member 1x per week for the next 4 weeks Outcome: Not Progressing

## 2021-08-21 ENCOUNTER — Telehealth (HOSPITAL_COMMUNITY): Payer: Self-pay | Admitting: Student in an Organized Health Care Education/Training Program

## 2021-08-21 NOTE — Telephone Encounter (Signed)
Clld pt to reschedule appt as Trinna Post will not be in on 6/22. No answer left vm for pt to cll bck to reschedule

## 2021-08-27 ENCOUNTER — Encounter (HOSPITAL_COMMUNITY): Payer: No Payment, Other | Admitting: Student in an Organized Health Care Education/Training Program

## 2021-09-01 ENCOUNTER — Ambulatory Visit (INDEPENDENT_AMBULATORY_CARE_PROVIDER_SITE_OTHER): Payer: No Payment, Other | Admitting: Licensed Clinical Social Worker

## 2021-09-01 DIAGNOSIS — F3162 Bipolar disorder, current episode mixed, moderate: Secondary | ICD-10-CM

## 2021-09-01 DIAGNOSIS — F4310 Post-traumatic stress disorder, unspecified: Secondary | ICD-10-CM | POA: Diagnosis not present

## 2021-09-01 DIAGNOSIS — F411 Generalized anxiety disorder: Secondary | ICD-10-CM

## 2021-09-01 NOTE — Progress Notes (Signed)
   THERAPIST PROGRESS NOTE  Session Time: 75  Participation Level: Active  Behavioral Response: CasualAlertAnxious and Depressed  Type of Therapy: Individual Therapy  Treatment Goals addressed: workout 3 x weekly   ProgressTowards Goals: Progressing  Interventions: CBT, Motivational Interviewing, and Supportive  Summary: [name] is a 41 y.o. male who presents with depressed and anxious mood\affect.  Patient was pleasant, cooperative, maintained good eye contact.  He engaged well in therapy session was dressed casually.  Patient endorses primary stressor as insomnia.  Patient reports that when he first started his Seroquel he slept 8 hours without any problems.  Patient reports he is now getting 4 hours of sleep.  Patient states that he does have a follow-up appointment with medication management in 2 days and was discussed further medication adjustments with medication provider.  Patient reports other reasons that sleep has not been great has been his neighbor who lets her dog out in the middle the night and he will park for 10 to 15 minutes before going inside.  Patient reports he has tried to engage with his neighbor but requests have been denied for letting the dog out earlier.  Patient reports a decrease in overall paranoia, tension, and worry.  Suicidal/Homicidal: Nowithout intent/plan  Therapist Response:    Intervention/Plan: Interventions utilized in today's session were supportive therapy, person centered therapy, motivational interviewing.  LCSW used reflective listening, open-ended questions, and positive affirmations.  LCSW utilized praise and encouragement.  LCSW educated patient on GAD-7 and PHQ-9 scores which were both administered test in today's session.   Plan: Return again in 4 weeks.  Diagnosis: PTSD (post-traumatic stress disorder)  GAD (generalized anxiety disorder)  Bipolar 1 disorder, mixed, moderate (HCC)  Collaboration of Care: Other none today.    Patient/Guardian was advised Release of Information must be obtained prior to any record release in order to collaborate their care with an outside provider. Patient/Guardian was advised if they have not already done so to contact the registration department to sign all necessary forms in order for [place] to release information regarding their care.   Consent: Patient/Guardian gives verbal consent for treatment and assignment of benefits for services provided during this visit. Patient/Guardian expressed understanding and agreed to proceed.   Weber Cooks, LCSW 09/01/2021

## 2021-09-03 ENCOUNTER — Ambulatory Visit (INDEPENDENT_AMBULATORY_CARE_PROVIDER_SITE_OTHER): Payer: No Payment, Other | Admitting: Student in an Organized Health Care Education/Training Program

## 2021-09-03 ENCOUNTER — Other Ambulatory Visit: Payer: Self-pay

## 2021-09-03 ENCOUNTER — Encounter (HOSPITAL_COMMUNITY): Payer: Self-pay | Admitting: Student in an Organized Health Care Education/Training Program

## 2021-09-03 VITALS — BP 141/100 | HR 93 | Ht 67.0 in | Wt 160.0 lb

## 2021-09-03 DIAGNOSIS — F411 Generalized anxiety disorder: Secondary | ICD-10-CM | POA: Diagnosis not present

## 2021-09-03 DIAGNOSIS — F429 Obsessive-compulsive disorder, unspecified: Secondary | ICD-10-CM

## 2021-09-03 DIAGNOSIS — F4310 Post-traumatic stress disorder, unspecified: Secondary | ICD-10-CM | POA: Diagnosis not present

## 2021-09-03 DIAGNOSIS — I10 Essential (primary) hypertension: Secondary | ICD-10-CM | POA: Diagnosis not present

## 2021-09-03 DIAGNOSIS — G4700 Insomnia, unspecified: Secondary | ICD-10-CM

## 2021-09-03 MED ORDER — PAROXETINE HCL 30 MG PO TABS
30.0000 mg | ORAL_TABLET | Freq: Every day | ORAL | 1 refills | Status: DC
  Filled 2021-09-03: qty 30, 30d supply, fill #0

## 2021-09-03 MED ORDER — QUETIAPINE FUMARATE 200 MG PO TABS
200.0000 mg | ORAL_TABLET | Freq: Every day | ORAL | 1 refills | Status: DC
  Filled 2021-09-03: qty 30, 30d supply, fill #0

## 2021-09-03 MED ORDER — PROPRANOLOL HCL 20 MG PO TABS
30.0000 mg | ORAL_TABLET | Freq: Two times a day (BID) | ORAL | 1 refills | Status: DC
  Filled 2021-09-03: qty 90, 30d supply, fill #0

## 2021-09-03 NOTE — Progress Notes (Signed)
BH MD/PA/NP OP Progress Note  09/03/2021 5:02 PM [name]  MRN:  [id number]  Chief Complaint:  Chief Complaint  Patient presents with   Medication Management   HPI:  [name] is a 41 yr old male who presents for follow up and continued medication management.  PPHx is significant for GAD, OCD, Insomnia, Chronic PTSD, EtOH Use Disorder, Bipolar Disorder, SAD, and ADHD, 1 Suicide Attempt (cut self with knife, teenager), past hospitalization (latest [facility] 2018).  He reports that he has had significant improvement in his symptoms since our last meeting.  He states that his anxiety is doing a lot better.  He states he has been able to get out of his house a lot more and now enjoys going on walks around the neighborhood.  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  He reports that the Seroquel has been a little too strong and that it is causing him to wake up feeling groggy and tired.  He states he has been cutting the Seroquel in half and this has resolved the issue.  He reports there is also a problem with his neighbor.  He reports that she lets her dog out in the middle of the night at midnight or 1 and that it is a [place] who barks all the time.  He reports that other neighbors have complained and so he is hopeful that this situation will be resolved.  He reports he still keeps his guns locked up at all times with the ammunition stored separately.  He reports never having any thoughts of harming himself or anyone else with them.  He reports that he still has not made an appointment with a PCP because he has been so busy with work recently.  Discussed with him the importance of getting his blood pressure under control given his father's history of 7 heart attacks.  He states he will get an appointment.  Discussed with him what to do in the event of a future crisis.  Discussed that he can return to the [facility], go to [facility], go to the nearest ED, or call [phone] or  [phone].   He reported understanding.  He has no SI, HI, or AVH.  He reports no [place], Ideas of Reference, or other First Rank symptoms.  He reports his appetite is doing good.  He reports his sleep is doing good.  He reports no other concerns at present.   Visit Diagnosis:    ICD-10-CM   1. PTSD (post-traumatic stress disorder)  F43.10     2. GAD (generalized anxiety disorder)  F41.1 propranolol (INDERAL) 20 MG tablet    PARoxetine (PAXIL) 30 MG tablet    3. Obsessive-compulsive disorder, unspecified type  F42.9 PARoxetine (PAXIL) 30 MG tablet    4. Essential hypertension  I10 propranolol (INDERAL) 20 MG tablet    5. Insomnia, unspecified type  G47.00 QUEtiapine (SEROQUEL) 200 MG tablet      Past Psychiatric History: GAD, OCD, Insomnia, Chronic PTSD, EtOH Use Disorder, Bipolar Disorder, SAD, and ADHD, 1 Suicide Attempt (cut self with knife, teenager), past hospitalization (latest [facility] 2018).  Past Medical History: No past medical history on file.  Past Surgical History:  Procedure Laterality Date   arm tendon surgery      Family Psychiatric History: Father- Cocaine Use No Known Diagnosis' or Suicides  Family History: No family history on file.  Social History:  Social History   Socioeconomic  History   Marital status: Single    Spouse name: Not on file   Number of children: Not on file   Years of education: Not on file   Highest education level: Not on file  Occupational History   Not on file  Tobacco Use   Smoking status: Every Day    Packs/day: 0.50    Types: Cigarettes   Smokeless tobacco: Never  Substance and Sexual Activity   Alcohol use: Yes    Alcohol/week: 21.0 standard drinks of alcohol    Types: 21 Cans of beer per week   Drug use: No   Sexual activity: Not Currently  Other Topics Concern   Not on file  Social History Narrative   Not on file   Social Determinants of Health   Financial Resource Strain: High Risk (04/01/2021)   Overall Financial  Resource Strain (CARDIA)    Difficulty of Paying Living Expenses: Hard  Food Insecurity: No Food Insecurity (04/01/2021)   Hunger Vital Sign    Worried About Running Out of Food in the Last Year: Never true    Ran Out of Food in the Last Year: Never true  Transportation Needs: No Transportation Needs (04/01/2021)   PRAPARE - Hydrologist (Medical): No    Lack of Transportation (Non-Medical): No  Physical Activity: Sufficiently Active (04/01/2021)   Exercise Vital Sign    Days of Exercise per Week: 7 days    Minutes of Exercise per Session: 150+ min  Stress: Stress Concern Present (04/01/2021)   [place]    Feeling of Stress : Very much  Social Connections: Socially Isolated (04/01/2021)   Social Connection and Isolation Panel [NHANES]    Frequency of Communication with Friends and Family: More than three times a week    Frequency of Social Gatherings with Friends and Family: Twice a week    Attends Religious Services: Never    Marine scientist or Organizations: No    Attends Music therapist: Never    Marital Status: Never married    Allergies: No Known Allergies  Metabolic Disorder Labs: No results found for: "HGBA1C", "MPG" No results found for: "PROLACTIN" No results found for: "CHOL", "TRIG", "HDL", "CHOLHDL", "VLDL", "LDLCALC" No results found for: "TSH"  Therapeutic Level Labs: No results found for: "LITHIUM" No results found for: "VALPROATE" No results found for: "CBMZ"  Current Medications: Current Outpatient Medications  Medication Sig Dispense Refill   PARoxetine (PAXIL) 30 MG tablet Take 1 tablet (30 mg total) by mouth daily. 30 tablet 1   propranolol (INDERAL) 20 MG tablet Take 1.5 tablets (30 mg total) by mouth 2 (two) times daily. 90 tablet 1   QUEtiapine (SEROQUEL) 200 MG tablet Take 1 tablet (200 mg total) by mouth at bedtime. 30 tablet 1   No  current facility-administered medications for this visit.     Musculoskeletal: Strength & Muscle Tone: within normal limits Gait & Station: normal Patient leans: N/A  Psychiatric Specialty Exam: Review of Systems  Respiratory:  Negative for cough and shortness of breath.   Cardiovascular:  Negative for chest pain.  Gastrointestinal:  Negative for abdominal pain, constipation, diarrhea, nausea and vomiting.  Neurological:  Negative for weakness and headaches.  Psychiatric/Behavioral:  Negative for agitation, dysphoric mood, hallucinations, sleep disturbance and suicidal ideas. The patient is not nervous/anxious.     Blood pressure (!) 141/100, pulse 93, height 5\' 7"  (1.702 m), weight 160 lb (  72.6 kg).Body mass index is 25.06 kg/m.  General Appearance: Casual and Fairly Groomed  Eye Contact:  Good  Speech:  Clear and Coherent and Normal Rate  Volume:  Normal  Mood:  Euthymic  Affect:  Appropriate and Congruent  Thought Process:  Coherent and Goal Directed  Orientation:  Full (Time, Place, and Person)  Thought Content: Logical   Suicidal Thoughts:  No  Homicidal Thoughts:  No  Memory:  Immediate;   Good Recent;   Good  Judgement:  Good  Insight:  Good  Psychomotor Activity:  Normal  Concentration:  Concentration: Good and Attention Span: Good  Recall:  Good  Fund of Knowledge: Good  Language: Good  Akathisia:  Negative  Handed:  Right  AIMS (if indicated): done  Assets:  Communication Skills Desire for Improvement Financial Resources/Insurance Housing Resilience Social Support  ADL's:  Intact  Cognition: WNL  Sleep:  Fair   Screenings: AIMS    Flowsheet Row [facility]  AIMS Total Score 0      AUDIT    Flowsheet Row Counselor from 04/01/2021 in [facility] (Dis[facility]  Alcohol Use  Disorder Identification Test Final Score (AUDIT) 13 9      GAD-7    Flowsheet Row Counselor from 09/01/2021 in [facility] Counselor from [facility]/04/2021 in [facility] Counselor from 07/07/2021 in [facility] Counselor from 06/03/2021 in [facility] Counselor from 05/13/2021 in [facility]  Total GAD-7 Score 9 6 18 17 16       PHQ2-9    Flowsheet Row Counselor from 09/01/2021 in [facility] Counselor from 07/07/2021 in [facility] Counselor from 06/03/2021 in [facility] Counselor from 05/13/2021 in [facility] Counselor from 04/01/2021 in [facility]  PHQ-2 Total Score 0 1 2 1 4   PHQ-9 Total Score 3 3 6 13 9       Flowsheet Row Counselor from 04/01/2021 in [facility]  C-SSRS RISK CATEGORY No Risk        Assessment and Plan:  [name] is a 41 yr old male who presents for follow up and continued medication management.  PPHx is significant for GAD, OCD, Insomnia, Chronic PTSD, EtOH Use Disorder, Bipolar Disorder, SAD, and ADHD, 1 Suicide Attempt (cut self with knife, teenager), past hospitalization (latest [facility] 2018).   [name] has had improvement in his anxiety since her last appointment.  He has been able to work more and get out of the house more.  He has been having issues with feeling sedated in the morning since the increase in his Seroquel.  We will decrease the Seroquel as 200 mg was effective in significantly improving his sleep but not having any side effect of feeling overly sedated in the morning when he wakes up.  He will return to the office in 6 weeks.   OCD  GAD  Chronic PTSD  Bipolar Disorder by Hx : -Continue Paxil 30 mg daily for anxiety and OCD -Continue Propanolol 30  mg BID for anxiety and HTN -Decrease Seroquel to 200 mg for QHS for mood stability and insomnia   Collaboration of Care: Case Discussed with Supervising Attending Dr. [facility]  Patient/Guardian was advised Release of Information must be obtained  prior to any record release in order to collaborate their care with an outside provider. Patient/Guardian was advised if they have not already done so to contact the registration department to sign all necessary forms in order for [place] to release information regarding their care.   Consent: Patient/Guardian gives verbal consent for treatment and assignment of benefits for services provided during this visit. Patient/Guardian expressed understanding and agreed to proceed.    Lauro Franklin, MD 09/03/2021, 5:02 PM

## 2021-09-10 ENCOUNTER — Other Ambulatory Visit: Payer: Self-pay

## 2021-09-23 ENCOUNTER — Ambulatory Visit (HOSPITAL_COMMUNITY): Payer: No Payment, Other | Admitting: Licensed Clinical Social Worker

## 2021-09-23 DIAGNOSIS — F411 Generalized anxiety disorder: Secondary | ICD-10-CM

## 2021-09-23 DIAGNOSIS — F4310 Post-traumatic stress disorder, unspecified: Secondary | ICD-10-CM

## 2021-09-23 NOTE — Plan of Care (Signed)
  Problem: Anxiety Disorder CCP Problem  1 Substance use mood disorder  Goal: LTG: Patient will score less than 5 on the Generalized Anxiety Disorder 7 Scale (GAD-7) Outcome: Progressing Goal: STG: Patient will complete at least 80% of assigned homework Outcome: Progressing Goal: STG: Patient will practice problem solving skills 3 times per week for the next 4 weeks Outcome: Progressing   Problem: Anxiety Disorder CCP Problem  1 Substance use mood disorder  Goal: Decrease PHQ-9 below 5  Outcome: Completed/Met   Problem: Anxiety Disorder CCP Problem  1 Substance use mood disorder  Goal: STG: Patient will attend couples/family counseling sessions with partner/family member 1x per week for the next 4 weeks Outcome: Not Applicable

## 2021-09-23 NOTE — Progress Notes (Signed)
   THERAPIST PROGRESS NOTE  Session Time: 32  Participation Level: Active  Behavioral Response: CasualAlertAnxious and Depressed  Type of Therapy: Individual Therapy  Treatment Goals addressed: Decrease GAD-7 below 5  ProgressTowards Goals: Progressing  Interventions: CBT and Motivational Interviewing  Summary: [name] is a 41 y.o. male who presents with euthymic mood\affect.  Patient was pleasant, cooperative, maintained good eye contact.  He engaged well in therapy session was dressed casually.  Patient was alert and oriented x5.  [name] comes in today stating everything has been going "well".  Patient reports that work has been picking up for his [employer].  Patient states that it has been overall hot but business has been busy.  Patient reports mental health is in a "good place".  Patient states that his medication management is going well.  Patient reports some concerns with his Seroquel.  Patient reports that he is cutting it up into "quarters".  Because it is making him feel lethargic in the mornings.  Patient states that he is taking it at 11 PM at night.  Suicidal/Homicidal: Nowithout intent/plan  Therapist Response:    Intervention/Plan: LCSW educated patient on taking medications as prescribed.  LCSW recommended to patient taking medication for Seroquel earlier than 11 PM to help that process through his system before the morning.  Suggestion was to take it at 10 PM moving forward.  LCSW administered his GAD-7.  LCSW administered the PHQ-9.  LCSW notes an increase in PHQ-9 x 2 points but patient still remains below his goal of 10.  Goal\objective met for PHQ-9.  LCSW notes a decrease in GAD-7 from a 9 last session to a 6 today.  Goal\objective for GAD-7 is below a 5  Plan: Return again in 3 weeks.  Diagnosis: PTSD (post-traumatic stress disorder)  GAD (generalized anxiety disorder)  Collaboration of Care: Medication Management AEB for education of Seroquel    Patient/Guardian was advised Release of Information must be obtained prior to any record release in order to collaborate their care with an outside provider. Patient/Guardian was advised if they have not already done so to contact the registration department to sign all necessary forms in order for [place] to release information regarding their care.   Consent: Patient/Guardian gives verbal consent for treatment and assignment of benefits for services provided during this visit. Patient/Guardian expressed understanding and agreed to proceed.   Dory Horn, LCSW 09/23/2021

## 2021-10-19 ENCOUNTER — Encounter (HOSPITAL_COMMUNITY): Payer: Self-pay

## 2021-10-20 ENCOUNTER — Ambulatory Visit (INDEPENDENT_AMBULATORY_CARE_PROVIDER_SITE_OTHER): Payer: No Payment, Other | Admitting: Licensed Clinical Social Worker

## 2021-10-20 ENCOUNTER — Encounter (HOSPITAL_COMMUNITY): Payer: No Payment, Other | Admitting: Student in an Organized Health Care Education/Training Program

## 2021-10-20 DIAGNOSIS — F4310 Post-traumatic stress disorder, unspecified: Secondary | ICD-10-CM | POA: Diagnosis not present

## 2021-10-20 DIAGNOSIS — F411 Generalized anxiety disorder: Secondary | ICD-10-CM

## 2021-10-20 NOTE — Progress Notes (Signed)
   THERAPIST PROGRESS NOTE  Session Time: 4  Participation Level: Active  Behavioral Response: CasualAlertAnxious  Type of Therapy: Individual Therapy  Treatment Goals addressed: Patient will practice problem solving skills 3 times per week for the next 4  weeks  ProgressTowards Goals: Progressing  Interventions: CBT, Motivational Interviewing, and Supportive  Summary: [name] is a 41 y.o. male who presents with depressed and anxious mood\affect.  Patient was pleasant, cooperative, maintained good eye contact.  He engaged well in therapy session was dressed casually.  Jerrald was alert and oriented x5.  Patient comes in today with primary stressors of increased anxiety in social settings.  Patient reports that he has been compliant with all medications to this point except for his Seroquel which she reports cutting into quarters because it makes him feel "zonked" if he takes the whole 300 mg.  Patient reports interest in exploring further medications outside of Seroquel.  Patient reports paranoia such as people are watching him while he is running or trying to interact with him in the grocery store.    Suicidal/Homicidal: Nowithout intent/plan  Therapist Response:    Intervention/Plan: LCSW supportive therapy for praise and encouragement.  LCSW educated patient on grounding technique for "5, 4, 3, 2, 1 grounding technique".  This is grounding patient to the here now while identifying all 5 of his senses.  Patient reports that he has abstained from drugs and alcohol over the past 2 months.  Patient plan is to Gradually increase exposure in social settings such as going to the grocery store.    Plan: Return again in 3 weeks.  Diagnosis: No diagnosis found.  Collaboration of Care: Other none today   Patient/Guardian was advised Release of Information must be obtained prior to any record release in order to collaborate their care with an outside provider. Patient/Guardian was  advised if they have not already done so to contact the registration department to sign all necessary forms in order for [place] to release information regarding their care.   Consent: Patient/Guardian gives verbal consent for treatment and assignment of benefits for services provided during this visit. Patient/Guardian expressed understanding and agreed to proceed.   Weber Cooks, LCSW 10/20/2021

## 2021-11-03 ENCOUNTER — Ambulatory Visit (INDEPENDENT_AMBULATORY_CARE_PROVIDER_SITE_OTHER): Payer: No Payment, Other | Admitting: Student in an Organized Health Care Education/Training Program

## 2021-11-03 ENCOUNTER — Encounter (HOSPITAL_COMMUNITY): Payer: Self-pay | Admitting: Student in an Organized Health Care Education/Training Program

## 2021-11-03 VITALS — BP 160/109 | HR 99 | Ht 67.0 in | Wt 156.4 lb

## 2021-11-03 DIAGNOSIS — F429 Obsessive-compulsive disorder, unspecified: Secondary | ICD-10-CM

## 2021-11-03 DIAGNOSIS — I10 Essential (primary) hypertension: Secondary | ICD-10-CM

## 2021-11-03 DIAGNOSIS — F411 Generalized anxiety disorder: Secondary | ICD-10-CM

## 2021-11-03 DIAGNOSIS — Z79899 Other long term (current) drug therapy: Secondary | ICD-10-CM

## 2021-11-03 DIAGNOSIS — G4700 Insomnia, unspecified: Secondary | ICD-10-CM

## 2021-11-03 MED ORDER — QUETIAPINE FUMARATE 200 MG PO TABS: 200.0000 mg | ORAL_TABLET | Freq: Every day | ORAL | 1 refills | Status: DC

## 2021-11-03 MED ORDER — PAROXETINE HCL 40 MG PO TABS: 40.0000 mg | ORAL_TABLET | Freq: Every day | ORAL | 1 refills | Status: DC

## 2021-11-03 MED ORDER — PROPRANOLOL HCL 40 MG PO TABS: 40.0000 mg | ORAL_TABLET | Freq: Two times a day (BID) | ORAL | 1 refills | Status: DC

## 2021-11-03 NOTE — Progress Notes (Signed)
BH MD/PA/NP OP Progress Note  11/04/2021 3:54 AM ESDRAS DELAIR  MRN:  [id number]  Chief Complaint:  Chief Complaint  Patient presents with   Follow-up   Anxiety   HPI:  Silvestre Mines is a 41 yr old male who presents for follow up and medication management.  PPHx is significant for GAD, OCD, Insomnia, Chronic PTSD, EtOH Use Disorder, Bipolar Disorder, SAD, and ADHD, 1 Suicide Attempt (cut self with knife, teenager), past hospitalization (latest [facility] 2018).  He reports that he has been doing better since our last appointment.  He reports that he has been able to get out of the house more and his anxiety is improved.  He reports that the 200 mg of Seroquel is occasionally a little strong but that he continues to have good sleep with it.    He reports he still has not made a PCP appointment.  Discussed the importance of it given his elevated BP and family history of cardiovascular health.  Discussed that it is very important to establish with a PCP.  Discussed getting lab work today due to being on an antipsychotic and the possible need to start additional medication.  He report understanding and had no questions.   Discussed further increasing the Paxil to help with his anxiety.  Discussed increasing his Propanolol to help with anxiety and his BP.  He was agreeable to these changes.  He reports no SI, HI, or AVH.  He reports his sleep is good.  He reports his appetite is doing good.  He reports his guns are kept separate from his ammo and has never had any thoughts of hurting himself or others with them.  He reports no other concerns at present.   Visit Diagnosis:    ICD-10-CM   1. GAD (generalized anxiety disorder)  F41.1 PARoxetine (PAXIL) 40 MG tablet    propranolol (INDERAL) 40 MG tablet    2. Obsessive-compulsive disorder, unspecified type  F42.9 PARoxetine (PAXIL) 40 MG tablet    3. Insomnia, unspecified type  G47.00 QUEtiapine (SEROQUEL) 200 MG tablet    4. Essential hypertension  I10  propranolol (INDERAL) 40 MG tablet    5. Long term current use of antipsychotic medication  Z79.899 CBC with Differential    Comprehensive Metabolic Panel (CMET)    Lipid Profile    HgB A1c      Past Psychiatric History: GAD, OCD, Insomnia, Chronic PTSD, EtOH Use Disorder, Bipolar Disorder, SAD, and ADHD, 1 Suicide Attempt (cut self with knife, teenager), past hospitalization (latest [facility] 2018).  Past Medical History: History reviewed. No pertinent past medical history.  Past Surgical History:  Procedure Laterality Date   arm tendon surgery      Family Psychiatric History: Father- Cocaine Use No Known Diagnosis' or Suicides  Family History: History reviewed. No pertinent family history.  Social History:  Social History   Socioeconomic History   Marital status: Single    Spouse name: Not on file   Number of children: Not on file   Years of education: Not on file   Highest education level: Not on file  Occupational History   Not on file  Tobacco Use   Smoking status: Every Day    Packs/day: 0.50    Types: Cigarettes   Smokeless tobacco: Never  Substance and Sexual Activity   Alcohol use: Yes    Alcohol/week: 21.0 standard drinks of alcohol    Types: 21 Cans of beer per week   Drug use: No   Sexual  activity: Not Currently  Other Topics Concern   Not on file  Social History Narrative   Not on file   Social Determinants of Health   Financial Resource Strain: High Risk (04/01/2021)   Overall Financial Resource Strain (CARDIA)    Difficulty of Paying Living Expenses: Hard  Food Insecurity: No Food Insecurity (04/01/2021)   Hunger Vital Sign    Worried About Running Out of Food in the Last Year: Never true    Ran Out of Food in the Last Year: Never true  Transportation Needs: No Transportation Needs (04/01/2021)   PRAPARE - Administrator, Civil Service (Medical): No    Lack of Transportation (Non-Medical): No  Physical Activity: Sufficiently Active  (04/01/2021)   Exercise Vital Sign    Days of Exercise per Week: 7 days    Minutes of Exercise per Session: 150+ min  Stress: Stress Concern Present (04/01/2021)   [employer] of Occupational Health - Occupational Stress Questionnaire    Feeling of Stress : Very much  Social Connections: Socially Isolated (04/01/2021)   Social Connection and Isolation Panel [NHANES]    Frequency of Communication with Friends and Family: More than three times a week    Frequency of Social Gatherings with Friends and Family: Twice a week    Attends Religious Services: Never    Database administrator or Organizations: No    Attends Engineer, structural: Never    Marital Status: Never married    Allergies: No Known Allergies  Metabolic Disorder Labs: No results found for: "HGBA1C", "MPG" No results found for: "PROLACTIN" No results found for: "CHOL", "TRIG", "HDL", "CHOLHDL", "VLDL", "LDLCALC" No results found for: "TSH"  Therapeutic Level Labs: No results found for: "LITHIUM" No results found for: "VALPROATE" No results found for: "CBMZ"  Current Medications: Current Outpatient Medications  Medication Sig Dispense Refill   PARoxetine (PAXIL) 40 MG tablet Take 1 tablet (40 mg total) by mouth daily. 30 tablet 1   propranolol (INDERAL) 40 MG tablet Take 1 tablet (40 mg total) by mouth 2 (two) times daily. 60 tablet 1   QUEtiapine (SEROQUEL) 200 MG tablet Take 1 tablet (200 mg total) by mouth at bedtime. 30 tablet 1   No current facility-administered medications for this visit.     Musculoskeletal: Strength & Muscle Tone: within normal limits Gait & Station: normal Patient leans: N/A  Psychiatric Specialty Exam: Review of Systems  Respiratory:  Negative for cough and shortness of breath.   Cardiovascular:  Negative for chest pain.  Gastrointestinal:  Negative for abdominal pain, constipation, diarrhea, nausea and vomiting.  Neurological:  Negative for weakness and headaches.   Psychiatric/Behavioral:  Negative for agitation, dysphoric mood, hallucinations, sleep disturbance and suicidal ideas. The patient is not nervous/anxious.     Blood pressure (!) 160/109, pulse 99, height 5\' 7"  (1.702 m), weight 156 lb 6.4 oz (70.9 kg), SpO2 95 %.Body mass index is 24.5 kg/m.  General Appearance: Casual and Fairly Groomed  Eye Contact:  Good  Speech:  Clear and Coherent and Normal Rate  Volume:  Normal  Mood:  Euthymic  Affect:  Appropriate and Congruent  Thought Process:  Coherent and Goal Directed  Orientation:  Full (Time, Place, and Person)  Thought Content: Logical   Suicidal Thoughts:  No  Homicidal Thoughts:  No  Memory:  Immediate;   Good Recent;   Good  Judgement:  Fair  Insight:  Fair  Psychomotor Activity:  Normal  Concentration:  Concentration: Good  and Attention Span: Good  Recall:  Good  Fund of Knowledge: Good  Language: Good  Akathisia:  Negative  Handed:  Right  AIMS (if indicated): done AIMS=0  Assets:  Communication Skills Desire for Improvement Financial Resources/Insurance Housing Resilience  ADL's:  Intact  Cognition: WNL  Sleep:  Good   Screenings: AIMS    Flowsheet [facility]  AIMS Total Score 0      AUDIT    Flowsheet Row Counselor from 04/01/2021 in [facility]  Alcohol Use Disorder Identification Test Final Score (AUDIT) 13 9      GAD-7    Flowsheet Row Counselor from 09/23/2021 in [facility] Counselor from 09/01/2021 in [facility] Counselor from 08/04/2021 in [facility] Counselor from 07/07/2021 in [facility] Counselor from 06/03/2021 in [facility]  Total GAD-7 Score 5 9 6 18 17        PHQ2-9    Flowsheet Row Counselor from 09/23/2021 in [facility] Counselor from 09/01/2021 in [facility] Counselor from 07/07/2021 in [facility] Counselor from 06/03/2021 in [facility] Counselor from 05/13/2021 in [facility]  PHQ-2 Total Score 0 0 1 2 1   PHQ-9 Total Score 5 3 3 6 13       Flowsheet Row Counselor from 09/23/2021 in [facility] Counselor from 04/01/2021 in [facility]  C-SSRS RISK CATEGORY No Risk No Risk        Assessment and Plan:  Trinton Prewitt is a 41 yr old male who presents for follow up and medication management.  PPHx is significant for GAD, OCD, Insomnia, Chronic PTSD, EtOH Use Disorder, Bipolar Disorder, SAD, and ADHD, 1 Suicide Attempt (cut self with knife, teenager), past hospitalization (latest [facility] 2018).   Vahe has continued to show improvement in his anxiety as he has bene able to interact more out of his house.  Since he is still having limitations put on what he would like to do we will increase his Paxil.  We will also increase his Propanolol for his anxiety and his blood pressure.  His BP while better continues to be elevated and he is at significant cardiovascular risk given his father having an MI around age 57.  We will get lab work today- CMP, CBC, A1c, and Lipid Panel as he is on an antipsychotic and he has not established with a PCP.  At his next appointment if he still does not have a PCP may need to start additional BP medication.  He will return for follow up in approximately 4 weeks.   OCD  GAD  Chronic PTSD  Bipolar Disorder by Hx : -Increase Paxil to 40 mg daily for anxiety and OCD. 30 tablets with 1 refills -Increase Propanolol to 40 mg BID for anxiety and HTN. 60 tablets with 1 refills -Continue Seroquel 200 mg for QHS for mood stability and  insomnia. 30 tablets with 1 refills   Collaboration of Care:   Patient/Guardian was advised Release of Information must be obtained prior to any record release in order to collaborate their care with an outside provider. Patient/Guardian was advised if they have not already done so to contact the registration department to sign  all necessary forms in order for [place] to release information regarding their care.   Consent: Patient/Guardian gives verbal consent for treatment and assignment of benefits for services provided during this visit. Patient/Guardian expressed understanding and agreed to proceed.    Lauro Franklin, MD 11/04/2021, 3:54 AM

## 2021-11-04 ENCOUNTER — Encounter (HOSPITAL_COMMUNITY): Payer: Self-pay | Admitting: Student in an Organized Health Care Education/Training Program

## 2021-11-10 ENCOUNTER — Ambulatory Visit (INDEPENDENT_AMBULATORY_CARE_PROVIDER_SITE_OTHER): Payer: No Payment, Other | Admitting: Licensed Clinical Social Worker

## 2021-11-10 DIAGNOSIS — F4310 Post-traumatic stress disorder, unspecified: Secondary | ICD-10-CM | POA: Diagnosis not present

## 2021-11-10 DIAGNOSIS — F411 Generalized anxiety disorder: Secondary | ICD-10-CM

## 2021-11-10 NOTE — Progress Notes (Signed)
   THERAPIST PROGRESS NOTE  Session Time: 30  Participation Level: Active  Behavioral Response: CasualAlertAnxious and Depressed  Type of Therapy: Individual Therapy  Treatment Goals addressed: Decrease PHQ-9 below a 10  ProgressTowards Goals: Met  Interventions: CBT, Motivational Interviewing, and Supportive  Summary: [name] is a 41 y.o. male who presents with depressed and anxious mood\affect.  Patient was pleasant, cooperative, and maintained good eye contact.  He engaged well in therapy session was dressed casually.  Patient reports primary stressors as illness.  Patient reports that he continues to have hypertension problems.  Patient reports that he has been getting labs ordered by psychiatrist and psychiatrist has ordered medication to help decrease blood pressure.    Suicidal/Homicidal: Nowithout intent/plan  Therapist Response:   LCSW did advise patient via solution focused therapy to find primary care physician.  LCSW provided resources to community health and wellness.  CSW administered the GAD-7.  LCSW administered a PHQ-9.  LCSW notes that patient continues to score below a 10 for PHQ-9 and goal\objective was below a 10.  Goal\objective has been met for PHQ-9.  LCSW notes an increase in GAD-7 from a 5 to an 8.  LCSW utilized solution focused therapy and supportive therapy in session.  LCSW utilized language such as praise and encouragement.  LCSW used unconditional positive regard to utilize on judgmental stances in session.   Plan: Return again in 3 weeks.  Diagnosis: No diagnosis found.  Collaboration of Care: Other None today   Patient/Guardian was advised Release of Information must be obtained prior to any record release in order to collaborate their care with an outside provider. Patient/Guardian was advised if they have not already done so to contact the registration department to sign all necessary forms in order for [place] to release information regarding  their care.   Consent: Patient/Guardian gives verbal consent for treatment and assignment of benefits for services provided during this visit. Patient/Guardian expressed understanding and agreed to proceed.   Dory Horn, LCSW 11/10/2021

## 2021-12-01 ENCOUNTER — Ambulatory Visit (INDEPENDENT_AMBULATORY_CARE_PROVIDER_SITE_OTHER): Payer: No Payment, Other | Admitting: Student in an Organized Health Care Education/Training Program

## 2021-12-01 ENCOUNTER — Encounter (HOSPITAL_COMMUNITY): Payer: Self-pay | Admitting: Student in an Organized Health Care Education/Training Program

## 2021-12-01 VITALS — BP 150/104 | HR 79 | Ht 67.0 in | Wt 155.8 lb

## 2021-12-01 DIAGNOSIS — G4700 Insomnia, unspecified: Secondary | ICD-10-CM

## 2021-12-01 DIAGNOSIS — I10 Essential (primary) hypertension: Secondary | ICD-10-CM | POA: Diagnosis not present

## 2021-12-01 DIAGNOSIS — F411 Generalized anxiety disorder: Secondary | ICD-10-CM

## 2021-12-01 DIAGNOSIS — F429 Obsessive-compulsive disorder, unspecified: Secondary | ICD-10-CM | POA: Diagnosis not present

## 2021-12-01 MED ORDER — QUETIAPINE FUMARATE 200 MG PO TABS: 200.0000 mg | ORAL_TABLET | Freq: Every day | ORAL | 1 refills | Status: DC

## 2021-12-01 MED ORDER — LISINOPRIL 5 MG PO TABS: 5.0000 mg | ORAL_TABLET | Freq: Every day | ORAL | 1 refills | Status: DC

## 2021-12-01 MED ORDER — PAROXETINE HCL 40 MG PO TABS: 40.0000 mg | ORAL_TABLET | Freq: Every day | ORAL | 1 refills | Status: DC

## 2021-12-01 MED ORDER — PROPRANOLOL HCL 40 MG PO TABS: 40.0000 mg | ORAL_TABLET | Freq: Two times a day (BID) | ORAL | 1 refills | Status: DC

## 2021-12-01 NOTE — Progress Notes (Signed)
BH MD/PA/NP OP Progress Note  12/01/2021 2:28 PM [name]  MRN:  [id number]  Chief Complaint:  Chief Complaint  Patient presents with   Follow-up   Anxiety   Depression   HPI:  [name] is a 41 yr old male who presents for follow up and medication management.  PPHx is significant for GAD, OCD, Insomnia, Chronic PTSD, EtOH Use Disorder, Bipolar Disorder, SAD, and ADHD, 1 Suicide Attempt (cut self with knife, teenager), past hospitalization (latest [facility] 2018).   He reports that he is continuing to have improvement.  He reports that his anxiety has had some improvement.  He reports he is able to interact more with the outside world.  He reports he continues to have issues with his neighbor letting her dog out in the middle of the night.  He reports other than this his sleep is doing good.  He reports he will be going to visit his father in [place] for a week soon and is looking forward to this.  He reports he has not established with a PCP yet.  Discussed that even though he did have blood work done at our last appointment the results have not returned yet.  Discussed we would reach out to the lab for, however, we will redraw them at the next appointment so he can be fasting if needed.  He reported understanding.  Discussed with him that given his continued elevated blood pressure we would need to start lisinopril.  Discussed potential risks and side effects and he was agreeable to a trial.  He reports no SI, HI, or AVH.  He reports appetite is doing good.  He reports no other concerns at present.  He return follow-up in approximately 4 weeks.   Visit Diagnosis:    ICD-10-CM   1. GAD (generalized anxiety disorder)  F41.1 PARoxetine (PAXIL) 40 MG tablet    propranolol (INDERAL) 40 MG tablet    2. Insomnia, unspecified type  G47.00 QUEtiapine (SEROQUEL) 200 MG tablet    3. Obsessive-compulsive disorder, unspecified type  F42.9 PARoxetine (PAXIL) 40 MG tablet    4. Essential  hypertension  I10 propranolol (INDERAL) 40 MG tablet    lisinopril (ZESTRIL) 5 MG tablet      Past Psychiatric History: GAD, OCD, Insomnia, Chronic PTSD, EtOH Use Disorder, Bipolar Disorder, SAD, and ADHD, 1 Suicide Attempt (cut self with knife, teenager), past hospitalization (latest [facility] 2018).  Past Medical History: History reviewed. No pertinent past medical history.  Past Surgical History:  Procedure Laterality Date   arm tendon surgery      Family Psychiatric History: Father- Cocaine Use No Known Diagnosis' or Suicides  Family History: History reviewed. No pertinent family history.  Social History:  Social History   Socioeconomic History   Marital status: Single    Spouse name: Not on file   Number of children: Not on file   Years of education: Not on file   Highest education level: Not on file  Occupational History   Not on file  Tobacco Use   Smoking status: Every Day    Packs/day: 0.50    Types: Cigarettes   Smokeless tobacco: Never  Substance and Sexual Activity   Alcohol use: Yes    Alcohol/week: 21.0 standard drinks of alcohol    Types: 21 Cans of beer per week   Drug use: No   Sexual activity: Not Currently  Other Topics Concern   Not on file  Social History Narrative   Not on file  Social Determinants of Health   Financial Resource Strain: High Risk (04/01/2021)   Overall Financial Resource Strain (CARDIA)    Difficulty of Paying Living Expenses: Hard  Food Insecurity: No Food Insecurity (04/01/2021)   Hunger Vital Sign    Worried About Running Out of Food in the Last Year: Never true    Ran Out of Food in the Last Year: Never true  Transportation Needs: No Transportation Needs (04/01/2021)   PRAPARE - Hydrologist (Medical): No    Lack of Transportation (Non-Medical): No  Physical Activity: Sufficiently Active (04/01/2021)   Exercise Vital Sign    Days of Exercise per Week: 7 days    Minutes of Exercise per Session:  150+ min  Stress: Stress Concern Present (04/01/2021)   [place]    Feeling of Stress : Very much  Social Connections: Socially Isolated (04/01/2021)   Social Connection and Isolation Panel [NHANES]    Frequency of Communication with Friends and Family: More than three times a week    Frequency of Social Gatherings with Friends and Family: Twice a week    Attends Religious Services: Never    Marine scientist or Organizations: No    Attends Music therapist: Never    Marital Status: Never married    Allergies: No Known Allergies  Metabolic Disorder Labs: No results found for: "HGBA1C", "MPG" No results found for: "PROLACTIN" No results found for: "CHOL", "TRIG", "HDL", "CHOLHDL", "VLDL", "LDLCALC" No results found for: "TSH"  Therapeutic Level Labs: No results found for: "LITHIUM" No results found for: "VALPROATE" No results found for: "CBMZ"  Current Medications: Current Outpatient Medications  Medication Sig Dispense Refill   lisinopril (ZESTRIL) 5 MG tablet Take 1 tablet (5 mg total) by mouth daily. 30 tablet 1   PARoxetine (PAXIL) 40 MG tablet Take 1 tablet (40 mg total) by mouth daily. 30 tablet 1   propranolol (INDERAL) 40 MG tablet Take 1 tablet (40 mg total) by mouth 2 (two) times daily. 60 tablet 1   QUEtiapine (SEROQUEL) 200 MG tablet Take 1 tablet (200 mg total) by mouth at bedtime. 30 tablet 1   No current facility-administered medications for this visit.     Musculoskeletal: Strength & Muscle Tone: within normal limits Gait & Station: normal Patient leans: N/A  Psychiatric Specialty Exam: Review of Systems  Respiratory:  Negative for cough and shortness of breath.   Cardiovascular:  Negative for chest pain.  Gastrointestinal:  Negative for abdominal pain, constipation, diarrhea, nausea and vomiting.  Neurological:  Negative for weakness and headaches.   Psychiatric/Behavioral:  Negative for dysphoric mood, hallucinations, self-injury, sleep disturbance and suicidal ideas. The patient is not nervous/anxious.     Blood pressure (!) 150/104, pulse 79, height 5\' 7"  (1.702 m), weight 155 lb 12.8 oz (70.7 kg), SpO2 97 %.Body mass index is 24.4 kg/m.  General Appearance: Casual and Fairly Groomed  Eye Contact:  Good  Speech:  Clear and Coherent and Normal Rate  Volume:  Normal  Mood:   "ok"  Affect:  Appropriate and Congruent  Thought Process:  Coherent and Goal Directed  Orientation:  Full (Time, Place, and Person)  Thought Content: WDL and Logical   Suicidal Thoughts:  No  Homicidal Thoughts:  No  Memory:  Immediate;   Good Recent;   Good  Judgement:  Good  Insight:  Good  Psychomotor Activity:  Normal  Concentration:  Concentration: Good and  Attention Span: Good  Recall:  Good  Fund of Knowledge: Good  Language: Good  Akathisia:  Negative  Handed:  Right  AIMS (if indicated): done  Assets:  Communication Skills Desire for Improvement Financial Resources/Insurance Housing Resilience  ADL's:  Intact  Cognition: WNL  Sleep:  Good   Screenings: AIMS    Flowsheet Row Admission (Discharged) from 01/26/2017 in [place] [facility]  AIMS Total Score 0      AUDIT    Flowsheet Row Counselor from 04/01/2021 in [facility] Admission (Discharged) from 01/26/2017 in [place] [facility]  Alcohol Use Disorder Identification Test Final Score (AUDIT) 13 9      GAD-7    Flowsheet Row Counselor from 11/10/2021 in [facility] Counselor from 09/23/2021 in [facility] Counselor from 09/01/2021 in [facility] Counselor from 08/04/2021 in [facility] Counselor from 07/07/2021 in [facility]  Total GAD-7 Score 8 5 9 6 18        [phone]    Flowsheet Row Counselor from 11/10/2021 in [facility] Counselor from 09/23/2021 in [facility] Counselor from 09/01/2021 in [facility] Counselor from 07/07/2021 in [name] Counselor from 06/03/2021 in [facility]  PHQ-2 Total Score 0 0 0 1 2  PHQ-9 Total Score 3 5 3 3 6       Flowsheet Row Counselor from 09/23/2021 in [facility] Counselor from 04/01/2021 in [place] No Risk No Risk        Assessment and Plan:  [name] is a 41 yr old male who presents for follow up and medication management.  PPHx is significant for GAD, OCD, Insomnia, Chronic PTSD, EtOH Use Disorder, Bipolar Disorder, SAD, and ADHD, 1 Suicide Attempt (cut self with knife, teenager), past hospitalization (latest [facility] 2018).     Wirt is continuing to have some improvement in his anxiety and is able to interact more with the outside world.  He did get lab work drawn last appointment however we do not have the results back yet but given his continued elevated blood pressure we will start low-dose lisinopril at this time.  We will be contacting lab core for those results but if they are unable to be obtained at the next appointment he will have the following blood work done-A1c, lipid panel, CBC, and CMP.  He will return for follow-up in approximately 4 weeks.     OCD  GAD  Chronic PTSD  Bipolar Disorder by Hx : -Continue Paxil 40 mg daily for anxiety and OCD. 30 tablets with 1 refills -Continue Propanolol 40 mg BID for anxiety and HTN. 60 tablets with 1 refills -Continue Seroquel 200 mg for QHS for mood stability and insomnia. 30 tablets with 1 refills   HTN: -Start Lisinopril 5 mg daily.  30 tablets with 1 refill.    Collaboration of Care:   Patient/Guardian was advised Release  of Information must be obtained prior to any record release in order to collaborate their care with an outside provider. Patient/Guardian was advised if they have not already done so to contact the registration department to sign all necessary forms in order for [place] to release information regarding their care.   Consent: Patient/Guardian gives verbal consent for treatment and assignment of  benefits for services provided during this visit. Patient/Guardian expressed understanding and agreed to proceed.    Briant Cedar, MD 12/01/2021, 2:28 PM

## 2021-12-08 ENCOUNTER — Ambulatory Visit (HOSPITAL_COMMUNITY): Payer: No Payment, Other | Admitting: Licensed Clinical Social Worker

## 2021-12-29 ENCOUNTER — Ambulatory Visit (INDEPENDENT_AMBULATORY_CARE_PROVIDER_SITE_OTHER): Payer: No Payment, Other | Admitting: Student in an Organized Health Care Education/Training Program

## 2021-12-29 ENCOUNTER — Other Ambulatory Visit (HOSPITAL_COMMUNITY): Payer: Self-pay | Admitting: Psychiatry

## 2021-12-29 VITALS — BP 133/90 | HR 92

## 2021-12-29 DIAGNOSIS — F411 Generalized anxiety disorder: Secondary | ICD-10-CM | POA: Diagnosis not present

## 2021-12-29 DIAGNOSIS — G4700 Insomnia, unspecified: Secondary | ICD-10-CM

## 2021-12-29 DIAGNOSIS — Z79899 Other long term (current) drug therapy: Secondary | ICD-10-CM

## 2021-12-29 DIAGNOSIS — I10 Essential (primary) hypertension: Secondary | ICD-10-CM | POA: Diagnosis not present

## 2021-12-29 DIAGNOSIS — F429 Obsessive-compulsive disorder, unspecified: Secondary | ICD-10-CM | POA: Diagnosis not present

## 2021-12-29 MED ORDER — QUETIAPINE FUMARATE 200 MG PO TABS: 200.0000 mg | ORAL_TABLET | Freq: Every day | ORAL | 1 refills | Status: DC

## 2021-12-29 MED ORDER — PROPRANOLOL HCL 40 MG PO TABS: 40.0000 mg | ORAL_TABLET | Freq: Two times a day (BID) | ORAL | 1 refills | Status: DC

## 2021-12-29 MED ORDER — LISINOPRIL 10 MG PO TABS: 10.0000 mg | ORAL_TABLET | Freq: Every day | ORAL | 1 refills | Status: DC

## 2021-12-29 MED ORDER — PAROXETINE HCL 40 MG PO TABS: 40.0000 mg | ORAL_TABLET | Freq: Every day | ORAL | 1 refills | Status: DC

## 2021-12-29 NOTE — Progress Notes (Signed)
BH MD/PA/NP OP Progress Note  12/29/2021 6:30 PM [name]  MRN:  [id number]  Chief Complaint:  Chief Complaint  Patient presents with   Follow-up   Anxiety   Hypertension   HPI:  [name] is a 41 yr old male who presents for follow up and medication management.  PPHx is significant for GAD, OCD, Insomnia, Chronic PTSD, EtOH Use Disorder, Bipolar Disorder, SAD, and ADHD, 1 Suicide Attempt (cut self with knife, teenager), past hospitalization (latest [facility] 2018).  He reports that he is continuing to do better on his medications.  He reports that he went down to [place] to visit his father for a little over 2 weeks he reports that he went to the air show while there which had to have put him around several tens of thousands of people.  He also reports spending a lot of time on the beach around many people and fishing.  He reports that he was not significantly distressed during these times and really enjoyed himself.  He reports that overall it was a very good trip and he was able to spend some good time with his father, brother, and sister.  Discussed with him that given his continuing improvement we would not make any changes to his psychiatric medications at this time.  Discussed with him that we were unable to get the lab results from his previous blood draw and so would need to repeat that today.  He reported understanding and did report he ate approximately 30 minutes prior to the appointment but that it was a small snack.  Discussed with him that this might change the results of his lipid profile but that we still needed to get this lab work.  Discussed with him that while his blood pressure was improved since starting lisinopril it still was elevated.  He reports no side effects or issues with starting lisinopril and so was agreeable to further increasing it.  Stressed upon him the importance of establishing with a PCP given his father's history of 7 heart attacks the first of which  happening when he was 75 which is his current age.  Also discussed the need for other health maintenance issues that can only be addressed by a PCP such as screenings (examples being colonoscopy or prostate) and the need to obtain an EKG since he was on Seroquel.  He reported understanding and that he would try to do this.  He reports no SI, HI, or AVH.  He reports no thoughts of paranoia or other first rank symptoms.  He Reports His Weapons Continue to Be locked with ammo stored separately and never having any thoughts of hurting anyone with them.  He reports no other concerns at present.  He will return for follow-up in approximately 4 weeks.    Visit Diagnosis:    ICD-10-CM   1. Long term current use of antipsychotic medication  Z79.899 HgB A1c    Comprehensive Metabolic Panel (CMET)    CBC with Differential    Lipid Profile    2. GAD (generalized anxiety disorder)  F41.1 PARoxetine (PAXIL) 40 MG tablet    propranolol (INDERAL) 40 MG tablet    3. Obsessive-compulsive disorder, unspecified type  F42.9 PARoxetine (PAXIL) 40 MG tablet    4. Essential hypertension  I10 propranolol (INDERAL) 40 MG tablet    lisinopril (ZESTRIL) 10 MG tablet    5. Insomnia, unspecified type  G47.00 QUEtiapine (SEROQUEL) 200 MG tablet      Past Psychiatric History:  GAD, OCD, Insomnia, Chronic PTSD, EtOH Use Disorder, Bipolar Disorder, SAD, and ADHD, 1 Suicide Attempt (cut self with knife, teenager), past hospitalization (latest [facility] 2018).  Past Medical History: No past medical history on file.  Past Surgical History:  Procedure Laterality Date   arm tendon surgery      Family Psychiatric History: Father- Cocaine Use No Known Diagnosis' or Suicides  Family History: No family history on file.  Social History:  Social History   Socioeconomic History   Marital status: Single    Spouse name: Not on file   Number of children: Not on file   Years of education: Not on file   Highest education level:  Not on file  Occupational History   Not on file  Tobacco Use   Smoking status: Every Day    Packs/day: 0.50    Types: Cigarettes   Smokeless tobacco: Never  Substance and Sexual Activity   Alcohol use: Yes    Alcohol/week: 21.0 standard drinks of alcohol    Types: 21 Cans of beer per week   Drug use: No   Sexual activity: Not Currently  Other Topics Concern   Not on file  Social History Narrative   Not on file   Social Determinants of Health   Financial Resource Strain: High Risk (04/01/2021)   Overall Financial Resource Strain (CARDIA)    Difficulty of Paying Living Expenses: Hard  Food Insecurity: No Food Insecurity (04/01/2021)   Hunger Vital Sign    Worried About Running Out of Food in the Last Year: Never true    Ran Out of Food in the Last Year: Never true  Transportation Needs: No Transportation Needs (04/01/2021)   PRAPARE - Administrator, Civil Service (Medical): No    Lack of Transportation (Non-Medical): No  Physical Activity: Sufficiently Active (04/01/2021)   Exercise Vital Sign    Days of Exercise per Week: 7 days    Minutes of Exercise per Session: 150+ min  Stress: Stress Concern Present (04/01/2021)   [employer] of Occupational Health - Occupational Stress Questionnaire    Feeling of Stress : Very much  Social Connections: Socially Isolated (04/01/2021)   Social Connection and Isolation Panel [NHANES]    Frequency of Communication with Friends and Family: More than three times a week    Frequency of Social Gatherings with Friends and Family: Twice a week    Attends Religious Services: Never    Database administrator or Organizations: No    Attends Engineer, structural: Never    Marital Status: Never married    Allergies: No Known Allergies  Metabolic Disorder Labs: No results found for: "HGBA1C", "MPG" No results found for: "PROLACTIN" No results found for: "CHOL", "TRIG", "HDL", "CHOLHDL", "VLDL", "LDLCALC" No results  found for: "TSH"  Therapeutic Level Labs: No results found for: "LITHIUM" No results found for: "VALPROATE" No results found for: "CBMZ"  Current Medications: Current Outpatient Medications  Medication Sig Dispense Refill   lisinopril (ZESTRIL) 10 MG tablet Take 1 tablet (10 mg total) by mouth daily. 30 tablet 1   PARoxetine (PAXIL) 40 MG tablet Take 1 tablet (40 mg total) by mouth daily. 30 tablet 1   propranolol (INDERAL) 40 MG tablet Take 1 tablet (40 mg total) by mouth 2 (two) times daily. 60 tablet 1   QUEtiapine (SEROQUEL) 200 MG tablet Take 1 tablet (200 mg total) by mouth at bedtime. 30 tablet 1   No current facility-administered medications for this visit.  Musculoskeletal: Strength & Muscle Tone: within normal limits Gait & Station: normal Patient leans: N/A  Psychiatric Specialty Exam: Review of Systems  Respiratory:  Negative for cough and shortness of breath.   Cardiovascular:  Negative for chest pain.  Gastrointestinal:  Negative for abdominal pain, constipation, diarrhea, nausea and vomiting.  Neurological:  Negative for weakness and headaches.  Psychiatric/Behavioral:  Negative for dysphoric mood, hallucinations, sleep disturbance and suicidal ideas. The patient is nervous/anxious (significantly improved).     Blood pressure (!) 133/90, pulse 92, SpO2 95 %.There is no height or weight on file to calculate BMI.  General Appearance: Casual and Fairly Groomed  Eye Contact:  Good  Speech:  Clear and Coherent and Normal Rate  Volume:  Normal  Mood:   "better"  Affect:  Appropriate and Congruent  Thought Process:  Coherent and Goal Directed  Orientation:  Full (Time, Place, and Person)  Thought Content: WDL and Logical   Suicidal Thoughts:  No  Homicidal Thoughts:  No  Memory:  Immediate;   Good Recent;   Good  Judgement:  Good  Insight:  Good  Psychomotor Activity:  Normal  Concentration:  Concentration: Good and Attention Span: Good  Recall:  Good   Fund of Knowledge: Good  Language: Good  Akathisia:  Negative  Handed:  Right  AIMS (if indicated): done AIMS=0  Assets:  Communication Skills Desire for Improvement Housing Resilience  ADL's:  Intact  Cognition: WNL  Sleep:  Good   Screenings: AIMS    Flowsheet Row [facility]  AIMS Total Score 0      AUDIT    Flowsheet Row Counselor from 04/01/2021 in [facility]  Alcohol Use Disorder Identification Test Final Score (AUDIT) 13 9      GAD-7    Flowsheet Row Counselor from 11/10/2021 in [facility] Counselor from 09/23/2021 in [facility] Counselor from 09/01/2021 in [facility] Counselor from 08/04/2021 in [facility] Counselor from 07/07/2021 in [facility]  Total GAD-7 Score 8 5 9 6 18       PHQ2-9    Flowsheet Row Counselor from 11/10/2021 in [facility] Counselor from 09/23/2021 in [facility] Counselor from 09/01/2021 in [facility] Counselor from 07/07/2021 in [facility] Counselor from 06/03/2021 in [facility]  PHQ-2 Total Score 0 0 0 1 2  PHQ-9 Total Score 3 5 3 3 6       Flowsheet Row Counselor from 09/23/2021 in [facility] Counselor from 04/01/2021 in [facility]  C-SSRS RISK CATEGORY No Risk No Risk        Assessment and Plan:  Deaire Mcwhirter is a 41 yr old male who presents for follow up and medication management.  PPHx is significant for GAD, OCD, Insomnia, Chronic PTSD, EtOH Use Disorder, Bipolar Disorder, SAD, and ADHD, 1 Suicide Attempt  (cut self with knife, teenager), past hospitalization (latest [facility] 2018).     Jeromy is continuing to improve, he was able to be in multiple crowded areas while on vacation without issue.  We will not make any changes to his psychiatric medications at this time.  His blood pressure is improved but still elevated and since he had  no side effects from starting Lisinopril we will increase it today.  He did have labwork drawn today as we were unable to get the results of the previous draw- A1c, lipid panel, CBC, and CMP.  He will return for follow-up in approximately 4 weeks.  Have again impressed the importance of establishing with a PCP for health maintenance and to obtain an EKG.     OCD  GAD  Chronic PTSD  Bipolar Disorder by Hx : -Continue Paxil 40 mg daily for anxiety and OCD. 30 tablets with 1 refills -Continue Propanolol 40 mg BID for anxiety and HTN. 60 tablets with 1 refills -Continue Seroquel 200 mg for QHS for mood stability and insomnia. 30 tablets with 1 refills     HTN: -Increase Lisinopril to 10 mg daily.  30 tablets with 1 refill.    Collaboration of Care:  Patient/Guardian was advised Release of Information must be obtained prior to any record release in order to collaborate their care with an outside provider. Patient/Guardian was advised if they have not already done so to contact the registration department to sign all necessary forms in order for [place] to release information regarding their care.   Consent: Patient/Guardian gives verbal consent for treatment and assignment of benefits for services provided during this visit. Patient/Guardian expressed understanding and agreed to proceed.    Lauro Franklin, MD 12/29/2021, 6:30 PM

## 2021-12-30 LAB — CBC WITH DIFFERENTIAL/PLATELET
Basophils Absolute: 0.1 10*3/uL (ref 0.0–0.2)
Basos: 1 %
EOS (ABSOLUTE): 0.2 10*3/uL (ref 0.0–0.4)
Eos: 3 %
Hematocrit: 45.1 % (ref 37.5–51.0)
Hemoglobin: 15.4 g/dL (ref 13.0–17.7)
Immature Grans (Abs): 0 10*3/uL (ref 0.0–0.1)
Immature Granulocytes: 0 %
Lymphocytes Absolute: 1.5 10*3/uL (ref 0.7–3.1)
Lymphs: 26 %
MCH: 32.4 pg (ref 26.6–33.0)
MCHC: 34.1 g/dL (ref 31.5–35.7)
MCV: 95 fL (ref 79–97)
Monocytes Absolute: 0.9 10*3/uL (ref 0.1–0.9)
Monocytes: 16 %
Neutrophils Absolute: 3.1 10*3/uL (ref 1.4–7.0)
Neutrophils: 54 %
Platelets: 230 10*3/uL (ref 150–450)
RBC: 4.75 x10E6/uL (ref 4.14–5.80)
RDW: 12.9 % (ref 11.6–15.4)
WBC: 5.8 10*3/uL (ref 3.4–10.8)

## 2021-12-30 LAB — HEMOGLOBIN A1C
Est. average glucose Bld gHb Est-mCnc: 117 mg/dL
Hgb A1c MFr Bld: 5.7 % — ABNORMAL HIGH (ref 4.8–5.6)

## 2021-12-30 LAB — COMPREHENSIVE METABOLIC PANEL
ALT: 76 IU/L — ABNORMAL HIGH (ref 0–44)
AST: 87 IU/L — ABNORMAL HIGH (ref 0–40)
Albumin/Globulin Ratio: 1.6 (ref 1.2–2.2)
Albumin: 4.2 g/dL (ref 4.1–5.1)
Alkaline Phosphatase: 74 IU/L (ref 44–121)
BUN/Creatinine Ratio: 10 (ref 9–20)
BUN: 7 mg/dL (ref 6–20)
Bilirubin Total: <0.2 mg/dL (ref 0.0–1.2)
CO2: 22 mmol/L (ref 20–29)
Calcium: 9 mg/dL (ref 8.7–10.2)
Chloride: 103 mmol/L (ref 96–106)
Creatinine, Ser: 0.73 mg/dL — ABNORMAL LOW (ref 0.76–1.27)
Globulin, Total: 2.7 g/dL (ref 1.5–4.5)
Glucose: 105 mg/dL — ABNORMAL HIGH (ref 70–99)
Potassium: 4 mmol/L (ref 3.5–5.2)
Sodium: 139 mmol/L (ref 134–144)
Total Protein: 6.9 g/dL (ref 6.0–8.5)
eGFR: 119 mL/min/{1.73_m2} (ref >59)

## 2021-12-30 LAB — LIPID PANEL
Chol/HDL Ratio: 2.2 ratio (ref 0.0–5.0)
Cholesterol, Total: 169 mg/dL (ref 100–199)
HDL: 77 mg/dL (ref >39)
LDL Chol Calc (NIH): 80 mg/dL (ref 0–99)
Triglycerides: 61 mg/dL (ref 0–149)
VLDL Cholesterol Cal: 12 mg/dL (ref 5–40)

## 2021-12-30 LAB — SPECIMEN STATUS REPORT

## 2022-01-05 ENCOUNTER — Ambulatory Visit (HOSPITAL_COMMUNITY): Payer: No Payment, Other | Admitting: Licensed Clinical Social Worker

## 2022-01-05 DIAGNOSIS — F4310 Post-traumatic stress disorder, unspecified: Secondary | ICD-10-CM

## 2022-01-05 DIAGNOSIS — F411 Generalized anxiety disorder: Secondary | ICD-10-CM

## 2022-01-05 DIAGNOSIS — F3162 Bipolar disorder, current episode mixed, moderate: Secondary | ICD-10-CM

## 2022-01-05 NOTE — Progress Notes (Signed)
   THERAPIST PROGRESS NOTE  Virtual Visit via Video Note  I connected with YUSEF LAMP on 01/05/22 at  3:00 PM EDT by a video enabled telemedicine application and verified that I am speaking with the correct person using two identifiers.  Location: Patient: [facility]  Provider: [facility]    I discussed the limitations of evaluation and management by telemedicine and the availability of in person appointments. The patient expressed understanding and agreed to proceed.     I discussed the assessment and treatment plan with the patient. The patient was provided an opportunity to ask questions and all were answered. The patient agreed with the plan and demonstrated an understanding of the instructions.   The patient was advised to call back or seek an in-person evaluation if the symptoms worsen or if the condition fails to improve as anticipated.  I provided 30  minutes of non-face-to-face time during this encounter.   Dory Horn, LCSW   Participation Level: Active  Behavioral Response: CasualAlertAnxious and Depressed  Type of Therapy: Individual Therapy  Treatment Goals addressed: Patient will complete at least 80% of assigned  homework  ProgressTowards Goals: Progressing  Interventions: CBT and Motivational Interviewing  Summary: FIONN STRACKE is a 41 y.o. male who presents with euthymic mood\affect.  Patient was pleasant, cooperative, maintained good eye contact.  He engaged well in therapy session was dressed casually.  Number stressors for patient as work.  Patient endorses a decrease in symptoms for paranoia, tension, and worry.  Patient reports examples of going to the store without fear of people watching him and also going for dogs without people looking at him out of their windows.  Patient states that he has been compliant with all his medications.  Patient reports coping skills as work, chores around the house, and working  out.  Suicidal/Homicidal: Nowithout intent/plan  Therapist Response:     Intervention/Plan: LCSW psychoanalytic therapy for patient to express thoughts, feelings, and emotions.  LCSW educated patient on taking medications as prescribed.  LCSW supportive therapy for praise and encouragement.  LCSW use person centered therapy to speak on patient's strengths.  LCSW used CBT therapy for reframing.  Plan: Return again in 3 weeks.  Diagnosis: Bipolar 1 disorder, mixed, moderate (HCC)  PTSD (post-traumatic stress disorder)  GAD (generalized anxiety disorder)  Collaboration of Care: Other None today   Patient/Guardian was advised Release of Information must be obtained prior to any record release in order to collaborate their care with an outside provider. Patient/Guardian was advised if they have not already done so to contact the registration department to sign all necessary forms in order for [place] to release information regarding their care.   Consent: Patient/Guardian gives verbal consent for treatment and assignment of benefits for services provided during this visit. Patient/Guardian expressed understanding and agreed to proceed.   Dory Horn, LCSW 01/05/2022

## 2022-01-20 ENCOUNTER — Ambulatory Visit (HOSPITAL_COMMUNITY): Payer: No Payment, Other | Admitting: Licensed Clinical Social Worker

## 2022-02-01 ENCOUNTER — Ambulatory Visit (INDEPENDENT_AMBULATORY_CARE_PROVIDER_SITE_OTHER): Payer: No Payment, Other | Admitting: Student in an Organized Health Care Education/Training Program

## 2022-02-01 DIAGNOSIS — F429 Obsessive-compulsive disorder, unspecified: Secondary | ICD-10-CM | POA: Diagnosis not present

## 2022-02-01 DIAGNOSIS — F411 Generalized anxiety disorder: Secondary | ICD-10-CM | POA: Diagnosis not present

## 2022-02-01 DIAGNOSIS — G4700 Insomnia, unspecified: Secondary | ICD-10-CM | POA: Diagnosis not present

## 2022-02-01 DIAGNOSIS — I10 Essential (primary) hypertension: Secondary | ICD-10-CM | POA: Diagnosis not present

## 2022-02-01 MED ORDER — LISINOPRIL 20 MG PO TABS: 20.0000 mg | ORAL_TABLET | Freq: Every day | ORAL | 1 refills | Status: DC

## 2022-02-01 MED ORDER — LISINOPRIL 10 MG PO TABS: 10.0000 mg | ORAL_TABLET | Freq: Every day | ORAL | 1 refills | Status: DC

## 2022-02-01 MED ORDER — PAROXETINE HCL 40 MG PO TABS: 40.0000 mg | ORAL_TABLET | Freq: Every day | ORAL | 1 refills | Status: DC

## 2022-02-01 MED ORDER — QUETIAPINE FUMARATE 200 MG PO TABS: 200.0000 mg | ORAL_TABLET | Freq: Every day | ORAL | 1 refills | Status: DC

## 2022-02-01 MED ORDER — PROPRANOLOL HCL 40 MG PO TABS: 40.0000 mg | ORAL_TABLET | Freq: Two times a day (BID) | ORAL | 1 refills | Status: DC

## 2022-02-01 NOTE — Progress Notes (Signed)
BH MD/PA/NP OP Progress Note  02/01/2022 3:28 PM [name]  MRN:  [id number]  Chief Complaint:  Chief Complaint  Patient presents with   Follow-up   HPI:  [name] is a 41 yr old male who presents for follow up and medication management.  PPHx is significant for GAD, OCD, Insomnia, Chronic PTSD, EtOH Use Disorder, Bipolar Disorder, SAD, and ADHD, 1 Suicide Attempt (cut self with knife, teenager), past hospitalization (latest [facility]).   He reports that he is doing good.  He reports his anxiety continues to improve.  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  He reports no side effects from his medications.  He reports his sleep is good.  He reports his appetite is doing good.  He reports no SI, HI, or AVH.  He confirms his firearms are kept locked and ammo stored separately and has had no thoughts of hurting himself or others with him.  Discussed his continued elevated blood pressure.  And if he had attempted to establish with a PCP.  He reports that he recently got Medicaid.  He reports that he had called a few times and in an attempt to clarify but that he had never gotten a hold of anyone.  On his Medicaid card it shows his PCP as [facility].  Discussed discussed with him that he should still call to see if this is truly his new PCP or if this is an error so that they could assist him in getting this fixed.  Discussed further increasing his lisinopril for his blood pressure and he was agreeable to this.  Discussed with him the importance that if he develops any changes in vision, chest pain, jaw pain or similar symptoms to be evaluated in the nearest emergency department and he reported understanding.  He reports no other concerns at present.  He will return for follow-up in approximately 6 weeks.    Visit Diagnosis:    ICD-10-CM   1. Essential hypertension  I10 propranolol (INDERAL) 40 MG tablet    lisinopril (ZESTRIL) 20 MG tablet     DISCONTINUED: lisinopril (ZESTRIL) 10 MG tablet    2. GAD (generalized anxiety disorder)  F41.1 PARoxetine (PAXIL) 40 MG tablet    propranolol (INDERAL) 40 MG tablet    3. Obsessive-compulsive disorder, unspecified type  F42.9 PARoxetine (PAXIL) 40 MG tablet    4. Insomnia, unspecified type  G47.00 QUEtiapine (SEROQUEL) 200 MG tablet      Past Psychiatric History: GAD, OCD, Insomnia, Chronic PTSD, EtOH Use Disorder, Bipolar Disorder, SAD, and ADHD, 1 Suicide Attempt (cut self with knife, teenager), [facility]   Past Medical History: No past medical history on file.  Past Surgical History:  Procedure Laterality Date   arm tendon surgery      Family Psychiatric History: Father- Cocaine Use No Known Diagnosis' or Suicides  Family History: No family history on file.  Social History:  Social History   Socioeconomic History   Marital status: Single    Spouse name: Not on file   Number of children: Not on file   Years of education: Not on file   Highest education level: Not on file  Occupational History   Not on file  Tobacco Use   Smoking status: Every Day    Packs/day: 0.50    Types: Cigarettes   Smokeless tobacco: Never  Substance and Sexual Activity   Alcohol use: Yes  Alcohol/week: 21.0 standard drinks of alcohol    Types: 21 Cans of beer per week   Drug use: No   Sexual activity: Not Currently  Other Topics Concern   Not on file  Social History Narrative   Not on file   Social Determinants of Health   Financial Resource Strain: High Risk (04/01/2021)   Overall Financial Resource Strain (CARDIA)    Difficulty of Paying Living Expenses: Hard  Food Insecurity: No Food Insecurity (04/01/2021)   Hunger Vital Sign    Worried About Running Out of Food in the Last Year: Never true    Ran Out of Food in the Last Year: Never true  Transportation Needs: No Transportation Needs (04/01/2021)   PRAPARE - Scientist, research (physical sciences) (Medical): No    Lack of Transportation (Non-Medical): No  Physical Activity: Sufficiently Active (04/01/2021)   Exercise Vital Sign    Days of Exercise per Week: 7 days    Minutes of Exercise per Session: 150+ min  Stress: Stress Concern Present (04/01/2021)   [employer] of Occupational Health - Occupational Stress Questionnaire    Feeling of Stress : Very much  Social Connections: Socially Isolated (04/01/2021)   Social Connection and Isolation Panel [NHANES]    Frequency of Communication with Friends and Family: More than three times a week    Frequency of Social Gatherings with Friends and Family: Twice a week    Attends Religious Services: Never    Database administrator or Organizations: No    Attends Engineer, structural: Never    Marital Status: Never married    Allergies: No Known Allergies  Metabolic Disorder Labs: Lab Results  Component Value Date   HGBA1C 5.7 (H) 12/29/2021   No results found for: "PROLACTIN" Lab Results  Component Value Date   CHOL 169 12/29/2021   TRIG 61 12/29/2021   HDL 77 12/29/2021   CHOLHDL 2.2 12/29/2021   LDLCALC 80 12/29/2021   No results found for: "TSH"  Therapeutic Level Labs: No results found for: "LITHIUM" No results found for: "VALPROATE" No results found for: "CBMZ"  Current Medications: Current Outpatient Medications  Medication Sig Dispense Refill   lisinopril (ZESTRIL) 20 MG tablet Take 1 tablet (20 mg total) by mouth daily. 30 tablet 1   PARoxetine (PAXIL) 40 MG tablet Take 1 tablet (40 mg total) by mouth daily. 30 tablet 1   propranolol (INDERAL) 40 MG tablet Take 1 tablet (40 mg total) by mouth 2 (two) times daily. 60 tablet 1   QUEtiapine (SEROQUEL) 200 MG tablet Take 1 tablet (200 mg total) by mouth at bedtime. 30 tablet 1   No current facility-administered medications for this visit.     Musculoskeletal: Strength & Muscle Tone: within normal limits Gait & Station: normal Patient  leans: N/A  Psychiatric Specialty Exam: Review of Systems  Respiratory:  Negative for cough and shortness of breath.   Cardiovascular:  Negative for chest pain.  Gastrointestinal:  Negative for abdominal pain, constipation, diarrhea, nausea and vomiting.  Neurological:  Negative for weakness and headaches.  Psychiatric/Behavioral:  Negative for dysphoric mood, hallucinations, sleep disturbance and suicidal ideas. The patient is not nervous/anxious.     Blood pressure (!) 161/101, pulse 88, height 5\' 7"  (1.702 m), weight 156 lb 9.6 oz (71 kg), SpO2 97 %.Body mass index is 24.53 kg/m.  General Appearance: Casual and Fairly Groomed  Eye Contact:  Good  Speech:  Clear and Coherent and Normal Rate  Volume:  Normal  Mood:  Euthymic  Affect:  Appropriate and Congruent  Thought Process:  Coherent and Goal Directed  Orientation:  Full (Time, Place, and Person)  Thought Content: WDL and Logical   Suicidal Thoughts:  No  Homicidal Thoughts:  No  Memory:  Immediate;   Good Recent;   Good  Judgement:  Fair  Insight:  Fair  Psychomotor Activity:  Normal  Concentration:  Concentration: Good and Attention Span: Good  Recall:  Good  Fund of Knowledge: Good  Language: Good  Akathisia:  Negative  Handed:  Right  AIMS (if indicated): not done  Assets:  Communication Skills Desire for Improvement Housing Resilience  ADL's:  Intact  Cognition: WNL  Sleep:  Good   Screenings: AIMS    Flowsheet Row [facility]  AIMS Total Score 0      AUDIT    Flowsheet Row Counselor from 04/01/2021 in [facility]  Alcohol Use Disorder Identification Test Final Score (AUDIT) 13 9      GAD-7    Flowsheet Row Counselor from 11/10/2021 in [facility] Counselor from 09/23/2021 in  [facility] Counselor from 09/01/2021 in [facility] Counselor from 08/04/2021 in [facility] Counselor from 07/07/2021 in [facility]  Total GAD-7 Score 8 5 9 6 18       PHQ2-9    Flowsheet Row Counselor from 11/10/2021 in [facility] Counselor from 09/23/2021 in [facility] Counselor from 09/01/2021 in [facility] Counselor from 07/07/2021 in [facility] Counselor from 06/03/2021 in [facility]  PHQ-2 Total Score 0 0 0 1 2  PHQ-9 Total Score 3 5 3 3 6       Flowsheet Row Counselor from 09/23/2021 in [facility] Counselor from 04/01/2021 in [facility]  C-SSRS RISK CATEGORY No Risk No Risk        Assessment and Plan:  [name] is a 41 yr old male who presents for follow up and medication management.  PPHx is significant for GAD, OCD, Insomnia, Chronic PTSD, EtOH Use Disorder, Bipolar Disorder, SAD, and ADHD, 1 Suicide Attempt (cut self with knife, teenager), past hospitalization (latest [facility] 2018).    Nikita from a psychiatric standpoint continues to improve his anxiety is now lessened to the point where he has been able to start a new job.  His blood pressure continues to be elevated so we will further increase his lisinopril.  He will call the [facility] to confirm if they are his new PCP and if not assistance in rectifying this.  We will not make any changes to his psychiatric medications at this time.  He will return for follow-up in approximately 6 weeks.   OCD  GAD  Chronic PTSD  Bipolar Disorder by Hx : -Continue Paxil 40 mg daily for anxiety and OCD. 30 tablets with 1 refills -Continue Propanolol 40 mg BID for anxiety and HTN. 60 tablets with 1  refills -Continue Seroquel 200 mg for QHS for mood stability and insomnia. 30 tablets with 1 refills     HTN: -Increase Lisinopril to 20 mg daily.  30 tablets with 1 refill    Collaboration of Care:  Patient/Guardian was advised Release of Information must be obtained prior to any record release in order to collaborate their care with an outside provider. Patient/Guardian was advised if they have not already done so to contact the registration department to sign all necessary forms in order for [place] to release information regarding their care.   Consent: Patient/Guardian gives verbal consent for treatment and assignment of benefits for services provided during this visit. Patient/Guardian expressed understanding and agreed to proceed.    Lauro Franklin, MD 02/01/2022, 3:28 PM

## 2022-02-05 DIAGNOSIS — Z419 Encounter for procedure for purposes other than remedying health state, unspecified: Secondary | ICD-10-CM | POA: Diagnosis not present

## 2022-02-09 ENCOUNTER — Ambulatory Visit (INDEPENDENT_AMBULATORY_CARE_PROVIDER_SITE_OTHER): Payer: No Payment, Other | Admitting: Licensed Clinical Social Worker

## 2022-02-09 DIAGNOSIS — F3162 Bipolar disorder, current episode mixed, moderate: Secondary | ICD-10-CM

## 2022-02-09 NOTE — Progress Notes (Signed)
   THERAPIST PROGRESS NOTE  Session Time: 30  Participation Level: Active  Behavioral Response: CasualAlertAnxious and Depressed  Type of Therapy: Individual Therapy  Treatment Goals addressed:  STG: Patient will practice problem solving skills 3 times per week for the next 4 weeks   ProgressTowards Goals: Progressing  Interventions: CBT, Motivational Interviewing, and Supportive  Summary: [name] is a 41 y.o. male who presents with depressed and anxious mood\affect.  Patient was pleasant, cooperative, maintained good eye contact.  He engaged well in therapy session was dressed casually.  [name] was alert and oriented x 5. ` Patient reports primary stressors of social interactions.  Patient reports apprehension when engaging in social activities such as going shopping or running errands.  Patient reports that this has decreased with medication management and therapy overall.  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  Patient does not know when he is going to start this new job as there is still movement that needs to be made in their current employment tree.  Patient reports that he is hopeful to start by the start of the new year.  Patient reports utilizing coping skills such as listening to loud music and utilizing his drum set.  Suicidal/Homicidal: Nowithout intent/plan  Therapist Response:     Intervention/Plan: Interventions utilized in today's session psychoanalytic therapy for patient to express thoughts, feelings and emotions.  LCSW used motivational interviewing for open-ended questions, reflective listening and positive affirmations.  LCSW used person centered therapy for empowerment.  LCSW educated patient on exposure therapy for teaching patient how to expose himself to social interactions in controlled environments.  Plan for patient is to start work in the next 30 days.  Plan: Return again in 3  weeks.  Diagnosis: No diagnosis found.  Collaboration of Care: Other None today   Patient/Guardian was advised Release of Information must be obtained prior to any record release in order to collaborate their care with an outside provider. Patient/Guardian was advised if they have not already done so to contact the registration department to sign all necessary forms in order for [place] to release information regarding their care.   Consent: Patient/Guardian gives verbal consent for treatment and assignment of benefits for services provided during this visit. Patient/Guardian expressed understanding and agreed to proceed.   Weber Cooks, LCSW 02/09/2022

## 2022-03-08 DIAGNOSIS — Z419 Encounter for procedure for purposes other than remedying health state, unspecified: Secondary | ICD-10-CM | POA: Diagnosis not present

## 2022-03-15 ENCOUNTER — Encounter (HOSPITAL_COMMUNITY): Payer: No Payment, Other | Admitting: Student in an Organized Health Care Education/Training Program

## 2022-03-16 ENCOUNTER — Ambulatory Visit (HOSPITAL_COMMUNITY): Payer: No Payment, Other | Admitting: Licensed Clinical Social Worker

## 2022-03-26 ENCOUNTER — Encounter (HOSPITAL_COMMUNITY): Payer: Self-pay | Admitting: Student in an Organized Health Care Education/Training Program

## 2022-03-26 ENCOUNTER — Ambulatory Visit (INDEPENDENT_AMBULATORY_CARE_PROVIDER_SITE_OTHER): Payer: No Payment, Other | Admitting: Student in an Organized Health Care Education/Training Program

## 2022-03-26 DIAGNOSIS — G4700 Insomnia, unspecified: Secondary | ICD-10-CM | POA: Diagnosis not present

## 2022-03-26 DIAGNOSIS — I10 Essential (primary) hypertension: Secondary | ICD-10-CM

## 2022-03-26 DIAGNOSIS — F429 Obsessive-compulsive disorder, unspecified: Secondary | ICD-10-CM | POA: Diagnosis not present

## 2022-03-26 DIAGNOSIS — F411 Generalized anxiety disorder: Secondary | ICD-10-CM | POA: Diagnosis not present

## 2022-03-26 MED ORDER — QUETIAPINE FUMARATE 200 MG PO TABS: 200.0000 mg | ORAL_TABLET | Freq: Every day | ORAL | 1 refills | Status: DC

## 2022-03-26 MED ORDER — PAROXETINE HCL 40 MG PO TABS: 40.0000 mg | ORAL_TABLET | Freq: Every day | ORAL | 1 refills | Status: DC

## 2022-03-26 MED ORDER — LISINOPRIL 20 MG PO TABS: 20.0000 mg | ORAL_TABLET | Freq: Every day | ORAL | 1 refills | Status: DC

## 2022-03-26 MED ORDER — PROPRANOLOL HCL 40 MG PO TABS: 40.0000 mg | ORAL_TABLET | Freq: Two times a day (BID) | ORAL | 1 refills | Status: DC

## 2022-03-26 NOTE — Progress Notes (Signed)
BH MD/PA/NP OP Progress Note  03/26/2022 3:54 PM [name]  MRN:  [id number]  Chief Complaint:  Chief Complaint  Patient presents with   Follow-up   HPI:  [name] is a 42 yr old male who presents for Follow Up and Medication Management.  PPHx is significant for GAD, OCD, Insomnia, Chronic PTSD, EtOH Use Disorder, Bipolar Disorder, SAD, and ADHD, 1 Suicide Attempt (cut self with knife, teenager), past Psychiatric Hospitalizations (latest [facility] 2018).    He reports that he has been doing well from a psychiatric standpoint.  He reports that today he is not doing so well because he suspects food poisoning from bad oysters that he ate for dinner last night.  He reports that he has had nausea and vomiting last night and most of today.  Discussed with him that this is the most likely cause of his elevated blood pressure today.  Discussed with him the importance of staying hydrated and that if his symptoms worsen he needs to be evaluated and he reported understanding.  When asked if he had established with a PCP he reports that he called  the [facility] [place] to confirm that they are not his PCP.  He reports that they provided him with a list of numbers to establish with a PCP.  Encouraged him to call to for better management of his elevated blood pressure.  Discussed with him that I would not make any medication changes at this time and he was agreeable to this.  He reports no SI, HI, or AVH.  He reports nausea and vomiting from the food poisoning.  He reports no arm/jaw pain and no changes in vision or headache.  He reports other than last night his sleep is good.  He reports that other than the last day his appetite is good.  He reports no concerns at present.  He will return for follow-up in 6 weeks.   Visit Diagnosis:    ICD-10-CM   1. Essential hypertension  I10 lisinopril (ZESTRIL) 20 MG tablet    propranolol (INDERAL) 40 MG tablet    2. GAD (generalized anxiety disorder)  F41.1  PARoxetine (PAXIL) 40 MG tablet    propranolol (INDERAL) 40 MG tablet    3. Obsessive-compulsive disorder, unspecified type  F42.9 PARoxetine (PAXIL) 40 MG tablet    4. Insomnia, unspecified type  G47.00 QUEtiapine (SEROQUEL) 200 MG tablet      Past Psychiatric History: GAD, OCD, Insomnia, Chronic PTSD, EtOH Use Disorder, Bipolar Disorder, SAD, and ADHD, 1 Suicide Attempt (cut self with knife, teenager), past Psychiatric Hospitalizations (latest [facility] 2018).     Past Medical History: No past medical history on file.  Past Surgical History:  Procedure Laterality Date   arm tendon surgery      Family Psychiatric History: Father- Cocaine Use No Known Diagnosis' or Suicides  Family History: No family history on file.  Social History:  Social History   Socioeconomic History   Marital status: Single    Spouse name: Not on file   Number of children: Not on file   Years of education: Not on file   Highest education level: Not on file  Occupational History   Not on file  Tobacco Use   Smoking status: Every Day    Packs/day: 0.50    Types: Cigarettes   Smokeless tobacco: Never  Substance and Sexual Activity   Alcohol use: Yes    Alcohol/week: 21.0 standard drinks of alcohol    Types:  21 Cans of beer per week   Drug use: No   Sexual activity: Not Currently  Other Topics Concern   Not on file  Social History Narrative   Not on file   Social Determinants of Health   Financial Resource Strain: High Risk (04/01/2021)   Overall Financial Resource Strain (CARDIA)    Difficulty of Paying Living Expenses: Hard  Food Insecurity: No Food Insecurity (04/01/2021)   Hunger Vital Sign    Worried About Running Out of Food in the Last Year: Never true    Ran Out of Food in the Last Year: Never true  Transportation Needs: No Transportation Needs (04/01/2021)   PRAPARE - Administrator, Civil Service (Medical): No    Lack of Transportation (Non-Medical): No  Physical Activity:  Sufficiently Active (04/01/2021)   Exercise Vital Sign    Days of Exercise per Week: 7 days    Minutes of Exercise per Session: 150+ min  Stress: Stress Concern Present (04/01/2021)   [employer] of Occupational Health - Occupational Stress Questionnaire    Feeling of Stress : Very much  Social Connections: Socially Isolated (04/01/2021)   Social Connection and Isolation Panel [NHANES]    Frequency of Communication with Friends and Family: More than three times a week    Frequency of Social Gatherings with Friends and Family: Twice a week    Attends Religious Services: Never    Database administrator or Organizations: No    Attends Engineer, structural: Never    Marital Status: Never married    Allergies: No Known Allergies  Metabolic Disorder Labs: Lab Results  Component Value Date   HGBA1C 5.7 (H) 12/29/2021   No results found for: "PROLACTIN" Lab Results  Component Value Date   CHOL 169 12/29/2021   TRIG 61 12/29/2021   HDL 77 12/29/2021   CHOLHDL 2.2 12/29/2021   LDLCALC 80 12/29/2021   No results found for: "TSH"  Therapeutic Level Labs: No results found for: "LITHIUM" No results found for: "VALPROATE" No results found for: "CBMZ"  Current Medications: Current Outpatient Medications  Medication Sig Dispense Refill   lisinopril (ZESTRIL) 20 MG tablet Take 1 tablet (20 mg total) by mouth daily. 30 tablet 1   PARoxetine (PAXIL) 40 MG tablet Take 1 tablet (40 mg total) by mouth daily. 30 tablet 1   propranolol (INDERAL) 40 MG tablet Take 1 tablet (40 mg total) by mouth 2 (two) times daily. 60 tablet 1   QUEtiapine (SEROQUEL) 200 MG tablet Take 1 tablet (200 mg total) by mouth at bedtime. 30 tablet 1   No current facility-administered medications for this visit.     Musculoskeletal: Strength & Muscle Tone: within normal limits Gait & Station: normal Patient leans: N/A  Psychiatric Specialty Exam: Review of Systems  HENT:         No jaw pain   Eyes:  Negative for visual disturbance.  Respiratory:  Negative for cough and shortness of breath.   Cardiovascular:  Negative for chest pain.  Gastrointestinal:  Positive for nausea and vomiting. Negative for abdominal pain, constipation and diarrhea.  Musculoskeletal:        No arm pain   Neurological:  Negative for dizziness, weakness and headaches.  Psychiatric/Behavioral:  Negative for dysphoric mood, hallucinations and sleep disturbance. The patient is not nervous/anxious.     Blood pressure (!) 168/108, pulse (!) 106, height 5\' 7"  (1.702 m), weight 147 lb 12.8 oz (67 kg), SpO2 97 %.Body mass  index is 23.15 kg/m.  General Appearance: Casual, Fairly Groomed, and ill appearing  Eye Contact:  Good  Speech:  Clear and Coherent and Normal Rate  Volume:  Normal  Mood:  Dysphoric  Affect:  Congruent  Thought Process:  Coherent and Goal Directed  Orientation:  Full (Time, Place, and Person)  Thought Content: WDL and Logical   Suicidal Thoughts:  No  Homicidal Thoughts:  No  Memory:  Immediate;   Good Recent;   Good  Judgement:  Good  Insight:  Good  Psychomotor Activity:  Normal  Concentration:  Concentration: Good and Attention Span: Good  Recall:  Good  Fund of Knowledge: Good  Language: Good  Akathisia:  Negative  Handed:  Right  AIMS (if indicated): not done  Assets:  Communication Skills Desire for Improvement Housing Resilience  ADL's:  Intact  Cognition: WNL  Sleep:  Good   Screenings: AIMS    Flowsheet Row Admission (Discharged) from 01/26/2017 in [facility] [facility]  AIMS Total Score 0      AUDIT    Flowsheet Row Counselor from 04/01/2021 in [facility] Admission (Discharged) from 01/26/2017 in [place] [facility]  Alcohol Use Disorder Identification Test Final Score (AUDIT) 13 9      GAD-7    Flowsheet Row Counselor from 11/10/2021 in [facility] Counselor from 09/23/2021 in [facility] Counselor from 09/01/2021 in [facility] Counselor from 08/04/2021 in [facility] Counselor from 07/07/2021 in [facility]  Total GAD-7 Score 8 5 9 6 18       PHQ2-9    Flowsheet Row Counselor from 11/10/2021 in [facility] Counselor from 09/23/2021 in [facility] Counselor from 09/01/2021 in [facility] Counselor from 07/07/2021 in [facility] Counselor from 06/03/2021 in [facility]  PHQ-2 Total Score 0 0 0 1 2  PHQ-9 Total Score 3 5 3 3 6       Flowsheet Row Counselor from 09/23/2021 in [facility] Counselor from 04/01/2021 in [place] No Risk No Risk        Assessment and Plan:  [name] is a 42 yr old male who presents for Follow Up and Medication Management.  PPHx is significant for GAD, OCD, Insomnia, Chronic PTSD, EtOH Use Disorder, Bipolar Disorder, SAD, and ADHD, 1 Suicide Attempt (cut self with knife, teenager), past Psychiatric Hospitalizations (latest [facility] 2018).     [name] has continued to remain stable from a psychiatric standpoint.  His blood pressure and heart rate are elevated today due to food poisoning he suspects from bad oysters last night.  He did call the [facility] [place] to confirm they are not his PCP and they did provide him with a list of numbers which he will call to establish with a PCP.  We will not make any changes to his medications at this time.  He will return for follow-up in approximately 6 weeks.   OCD  GAD  Chronic PTSD  Bipolar Disorder by Hx : -Continue Paxil 40 mg daily for anxiety and OCD. 30 tablets with 1 refills -Continue Propanolol 40 mg BID for anxiety  and HTN. 60 tablets with 1 refills -Continue Seroquel 200 mg for QHS for mood stability and insomnia. [place]  tablets with 1 refills     HTN: -Continue Lisinopril 20 mg daily.  30 tablets with 1 refill    Collaboration of Care: Collaboration of Care: Other provider involved in patient's care [facility] Therapist  Patient/Guardian was advised Release of Information must be obtained prior to any record release in order to collaborate their care with an outside provider. Patient/Guardian was advised if they have not already done so to contact the registration department to sign all necessary forms in order for [place] to release information regarding their care.   Consent: Patient/Guardian gives verbal consent for treatment and assignment of benefits for services provided during this visit. Patient/Guardian expressed understanding and agreed to proceed.    Briant Cedar, MD 03/26/2022, 3:54 PM

## 2022-03-31 ENCOUNTER — Telehealth: Payer: Self-pay

## 2022-03-31 NOTE — Telephone Encounter (Signed)
Mychart msg sent. AS, CMA 

## 2022-04-08 DIAGNOSIS — Z419 Encounter for procedure for purposes other than remedying health state, unspecified: Secondary | ICD-10-CM | POA: Diagnosis not present

## 2022-04-27 ENCOUNTER — Ambulatory Visit (HOSPITAL_COMMUNITY): Payer: Medicaid Other | Admitting: Licensed Clinical Social Worker

## 2022-05-07 ENCOUNTER — Ambulatory Visit (INDEPENDENT_AMBULATORY_CARE_PROVIDER_SITE_OTHER): Payer: Medicaid Other | Admitting: Student in an Organized Health Care Education/Training Program

## 2022-05-07 VITALS — BP 147/100 | HR 87 | Ht 67.0 in | Wt 159.0 lb

## 2022-05-07 DIAGNOSIS — F429 Obsessive-compulsive disorder, unspecified: Secondary | ICD-10-CM

## 2022-05-07 DIAGNOSIS — G4700 Insomnia, unspecified: Secondary | ICD-10-CM

## 2022-05-07 DIAGNOSIS — F1090 Alcohol use, unspecified, uncomplicated: Secondary | ICD-10-CM | POA: Diagnosis not present

## 2022-05-07 DIAGNOSIS — F411 Generalized anxiety disorder: Secondary | ICD-10-CM

## 2022-05-07 DIAGNOSIS — I10 Essential (primary) hypertension: Secondary | ICD-10-CM | POA: Diagnosis not present

## 2022-05-07 DIAGNOSIS — F4310 Post-traumatic stress disorder, unspecified: Secondary | ICD-10-CM | POA: Diagnosis not present

## 2022-05-07 DIAGNOSIS — Z419 Encounter for procedure for purposes other than remedying health state, unspecified: Secondary | ICD-10-CM | POA: Diagnosis not present

## 2022-05-07 MED ORDER — LISINOPRIL 20 MG PO TABS: 20.0000 mg | ORAL_TABLET | Freq: Every day | ORAL | 1 refills | Status: DC

## 2022-05-07 MED ORDER — QUETIAPINE FUMARATE 200 MG PO TABS: 200.0000 mg | ORAL_TABLET | Freq: Every day | ORAL | 1 refills | Status: DC

## 2022-05-07 MED ORDER — PROPRANOLOL HCL 40 MG PO TABS: 40.0000 mg | ORAL_TABLET | Freq: Two times a day (BID) | ORAL | 1 refills | Status: DC

## 2022-05-07 MED ORDER — PAROXETINE HCL 40 MG PO TABS: 40.0000 mg | ORAL_TABLET | Freq: Every day | ORAL | 1 refills | Status: DC

## 2022-05-07 NOTE — Progress Notes (Signed)
BH MD/PA/NP OP Progress Note  05/08/2022 8:56 AM [name]  MRN:  [id number]  Chief Complaint:  Chief Complaint  Patient presents with   Follow-up   Alcohol Problem   HPI:  [name] is a 42 yr old male who presents for Follow Up and Medication Management.  PPHx is significant for GAD, OCD, Insomnia, Chronic PTSD, EtOH Use Disorder, Bipolar Disorder, SAD, and ADHD, 1 Suicide Attempt (cut self with knife, teenager), past Psychiatric Hospitalizations (latest [facility] 2018).   He presents today with his mother.  He reports that he would like help for alcohol use.  He reports he has been drinking 15-18 beers a day.  He reports no history of withdrawal seizures or DT's.  Discussed that we could admit him to the [facility] for detox and then we would be able to assist with gettig into residential rehab.  He reports that he would like to do this but not today.  After discussion he has agreed on returning on Monday to be admitted to [facility].  Discussed that refills of his medications would be sent in and we would assist him.  He reports no issues with his medications.  He reports no SI, HI, or AVH.  He reports his sleep is good.  He reports his appetite is good.  He reports no other concerns at present.    Visit Diagnosis:    ICD-10-CM   1. Alcohol use disorder  F10.90     2. Essential hypertension  I10 lisinopril (ZESTRIL) 20 MG tablet    propranolol (INDERAL) 40 MG tablet    3. GAD (generalized anxiety disorder)  F41.1 PARoxetine (PAXIL) 40 MG tablet    propranolol (INDERAL) 40 MG tablet    4. Obsessive-compulsive disorder, unspecified type  F42.9 PARoxetine (PAXIL) 40 MG tablet    5. Insomnia, unspecified type  G47.00 QUEtiapine (SEROQUEL) 200 MG tablet    6. PTSD (post-traumatic stress disorder)  F43.10       Past Psychiatric History: GAD, OCD, Insomnia, Chronic PTSD, EtOH Use Disorder, Bipolar Disorder, SAD, and ADHD, 1 Suicide Attempt (cut self with knife, teenager), past Psychiatric  Hospitalizations (latest [facility] 2018).   Past Medical History: No past medical history on file.  Past Surgical History:  Procedure Laterality Date   arm tendon surgery      Family Psychiatric History: Father- Cocaine Use No Known Diagnosis' or Suicides  Family History: No family history on file.  Social History:  Social History   Socioeconomic History   Marital status: Single    Spouse name: Not on file   Number of children: Not on file   Years of education: Not on file   Highest education level: Not on file  Occupational History   Not on file  Tobacco Use   Smoking status: Every Day    Packs/day: 0.50    Types: Cigarettes   Smokeless tobacco: Never  Substance and Sexual Activity   Alcohol use: Yes    Alcohol/week: 21.0 standard drinks of alcohol    Types: 21 Cans of beer per week   Drug use: No   Sexual activity: Not Currently  Other Topics Concern   Not on file  Social History Narrative   Not on file   Social Determinants of Health   Financial Resource Strain: High Risk (04/01/2021)   Overall Financial Resource Strain (CARDIA)    Difficulty of Paying Living Expenses: Hard  Food Insecurity: No Food Insecurity (04/01/2021)   Hunger Vital Sign  Worried About Charity fundraiser in the Last Year: Never true    [place] in the Last Year: Never true  Transportation Needs: No Transportation Needs (04/01/2021)   PRAPARE - Hydrologist (Medical): No    Lack of Transportation (Non-Medical): No  Physical Activity: Sufficiently Active (04/01/2021)   Exercise Vital Sign    Days of Exercise per Week: 7 days    Minutes of Exercise per Session: 150+ min  Stress: Stress Concern Present (04/01/2021)   [place]    Feeling of Stress : Very much  Social Connections: Socially Isolated (04/01/2021)   Social Connection and Isolation Panel [NHANES]    Frequency of Communication with  Friends and Family: More than three times a week    Frequency of Social Gatherings with Friends and Family: Twice a week    Attends Religious Services: Never    Marine scientist or Organizations: No    Attends Music therapist: Never    Marital Status: Never married    Allergies: No Known Allergies  Metabolic Disorder Labs: Lab Results  Component Value Date   HGBA1C 5.7 (H) 12/29/2021   No results found for: "PROLACTIN" Lab Results  Component Value Date   CHOL 169 12/29/2021   TRIG 61 12/29/2021   HDL 77 12/29/2021   CHOLHDL 2.2 12/29/2021   [place] 80 12/29/2021   No results found for: "TSH"  Therapeutic Level Labs: No results found for: "LITHIUM" No results found for: "VALPROATE" No results found for: "CBMZ"  Current Medications: Current Outpatient Medications  Medication Sig Dispense Refill   lisinopril (ZESTRIL) 20 MG tablet Take 1 tablet (20 mg total) by mouth daily. 30 tablet 1   PARoxetine (PAXIL) 40 MG tablet Take 1 tablet (40 mg total) by mouth daily. 30 tablet 1   propranolol (INDERAL) 40 MG tablet Take 1 tablet (40 mg total) by mouth 2 (two) times daily. 60 tablet 1   QUEtiapine (SEROQUEL) 200 MG tablet Take 1 tablet (200 mg total) by mouth at bedtime. 30 tablet 1   No current facility-administered medications for this visit.     Musculoskeletal: Strength & Muscle Tone: within normal limits Gait & Station: normal Patient leans: N/A  Psychiatric Specialty Exam: Review of Systems  Respiratory:  Negative for cough and shortness of breath.   Cardiovascular:  Negative for chest pain.  Gastrointestinal:  Negative for abdominal pain, constipation, diarrhea, nausea and vomiting.  Neurological:  Negative for dizziness, weakness and headaches.  Psychiatric/Behavioral:  Positive for dysphoric mood. Negative for hallucinations, sleep disturbance and suicidal ideas. The patient is not nervous/anxious.     Blood pressure (!) 147/100, pulse 87,  height '5\' 7"'$  (1.702 m), weight 159 lb (72.1 kg), SpO2 95 %.Body mass index is 24.9 kg/m.  General Appearance: Casual and Fairly Groomed  Eye Contact:  Fair  Speech:  Clear and Coherent and Normal Rate  Volume:  Normal  Mood:  Dysphoric  Affect:  Congruent  Thought Process:  Coherent and Goal Directed  Orientation:  Full (Time, Place, and Person)  Thought Content: WDL and Logical   Suicidal Thoughts:  No  Homicidal Thoughts:  No  Memory:  Immediate;   Good Recent;   Good  Judgement:  Fair  Insight:  Fair  Psychomotor Activity:  Normal  Concentration:  Concentration: Good and Attention Span: Good  Recall:  Good  Fund of Knowledge: Good  Language: Good  Akathisia:  Negative  Handed:  Right  AIMS (if indicated): not done  Assets:  Communication Skills Desire for Improvement Financial Resources/Insurance Housing Physical Health Resilience Social Support  ADL's:  Intact  Cognition: WNL  Sleep:  Good   Screenings: AIMS    Flowsheet Row Admission (Discharged) from 01/26/2017 in [facility]  AIMS Total Score 0      AUDIT    Flowsheet Row Counselor from 04/01/2021 in [facility] Admission (Discharged) from 01/26/2017 in [facility] [place] [facility]  Alcohol Use Disorder Identification Test Final Score (AUDIT) 13 9      GAD-7    Flowsheet Row Counselor from 11/10/2021 in [facility] Counselor from 09/23/2021 in [facility] Counselor from 09/01/2021 in [facility] Counselor from 08/04/2021 in [facility] Counselor from 07/07/2021 in [facility]  Total GAD-7 Score '8 5 9 6 18      '$ [phone]    Flowsheet Row Counselor from 11/10/2021 in [facility] Counselor from 09/23/2021 in [facility] Counselor from  09/01/2021 in [facility] Counselor from 07/07/2021 in [facility] Counselor from 06/03/2021 in [facility]  PHQ-2 Total Score 0 0 0 1 2  PHQ-9 Total Score '3 5 3 3 6      '$ Flowsheet Row Counselor from 09/23/2021 in [facility] Counselor from 04/01/2021 in [place] No Risk No Risk        Assessment and Plan:  [name] is a 42 yr old male who presents for Follow Up and Medication Management.  PPHx is significant for GAD, OCD, Insomnia, Chronic PTSD, EtOH Use Disorder, Bipolar Disorder, SAD, and ADHD, 1 Suicide Attempt (cut self with knife, teenager), past Psychiatric Hospitalizations (latest [facility] 2018).    Brain has asked for help with his EtOH Abuse.  He is currently willing to be admitted to [facility] on Monday.  We will not make any changes to his medications at this time.  Refills will be sent in.  He will return with his mother on Monday to be admitted to [facility] to begin treatment for his EtOH Abuse.   OCD  GAD  Chronic PTSD  Bipolar Disorder by Hx : -Continue Paxil 40 mg daily for anxiety and OCD. 30 tablets with 1 refills -Continue Propanolol 40 mg BID for anxiety and HTN. 60 tablets with 1 refills -Continue Seroquel 200 mg for QHS for mood stability and insomnia. 30 tablets with 1 refills     HTN: -Continue Lisinopril 20 mg daily.  30 tablets with 1 refill    Collaboration of Care:   Patient/Guardian was advised Release of Information must be obtained prior to any record release in order to collaborate their care with an outside provider. Patient/Guardian was advised if they have not already done so to contact the registration department to sign all necessary forms in order for [place] to release information regarding their care.   Consent: Patient/Guardian gives verbal consent for treatment and assignment of benefits for  services provided during this visit. Patient/Guardian expressed understanding and agreed to proceed.    Briant Cedar, MD 05/08/2022, 8:56 AM

## 2022-05-08 ENCOUNTER — Encounter (HOSPITAL_COMMUNITY): Payer: Self-pay | Admitting: Student in an Organized Health Care Education/Training Program

## 2022-05-10 ENCOUNTER — Other Ambulatory Visit (HOSPITAL_COMMUNITY)
Admission: EM | Admit: 2022-05-10 | Discharge: 2022-05-17 | Disposition: A | Discharge status: Home / Self Care | Payer: Medicaid Other | Attending: Psychiatry | Admitting: Psychiatry

## 2022-05-10 DIAGNOSIS — F319 Bipolar disorder, unspecified: Secondary | ICD-10-CM | POA: Insufficient documentation

## 2022-05-10 DIAGNOSIS — F4310 Post-traumatic stress disorder, unspecified: Secondary | ICD-10-CM | POA: Insufficient documentation

## 2022-05-10 DIAGNOSIS — F1090 Alcohol use, unspecified, uncomplicated: Secondary | ICD-10-CM | POA: Diagnosis present

## 2022-05-10 DIAGNOSIS — Z1152 Encounter for screening for COVID-19: Secondary | ICD-10-CM | POA: Insufficient documentation

## 2022-05-10 DIAGNOSIS — R000 Tachycardia, unspecified: Secondary | ICD-10-CM | POA: Insufficient documentation

## 2022-05-10 DIAGNOSIS — F429 Obsessive-compulsive disorder, unspecified: Secondary | ICD-10-CM | POA: Diagnosis not present

## 2022-05-10 DIAGNOSIS — F1290 Cannabis use, unspecified, uncomplicated: Secondary | ICD-10-CM | POA: Insufficient documentation

## 2022-05-10 DIAGNOSIS — F411 Generalized anxiety disorder: Secondary | ICD-10-CM | POA: Insufficient documentation

## 2022-05-10 DIAGNOSIS — F17210 Nicotine dependence, cigarettes, uncomplicated: Secondary | ICD-10-CM | POA: Diagnosis not present

## 2022-05-10 DIAGNOSIS — I10 Essential (primary) hypertension: Secondary | ICD-10-CM | POA: Diagnosis not present

## 2022-05-10 LAB — CBC WITH DIFFERENTIAL/PLATELET
Abs Immature Granulocytes: 0.04 10*3/uL (ref 0.00–0.07)
Basophils Absolute: 0.1 10*3/uL (ref 0.0–0.1)
Basophils Relative: 1 %
Eosinophils Absolute: 0.1 10*3/uL (ref 0.0–0.5)
Eosinophils Relative: 2 %
HCT: 44.5 % (ref 39.0–52.0)
Hemoglobin: 15.9 g/dL (ref 13.0–17.0)
Immature Granulocytes: 1 %
Lymphocytes Relative: 33 %
Lymphs Abs: 2.2 10*3/uL (ref 0.7–4.0)
MCH: 33.5 pg (ref 26.0–34.0)
MCHC: 35.7 g/dL (ref 30.0–36.0)
MCV: 93.7 fL (ref 80.0–100.0)
Monocytes Absolute: 0.8 10*3/uL (ref 0.1–1.0)
Monocytes Relative: 12 %
Neutro Abs: 3.3 10*3/uL (ref 1.7–7.7)
Neutrophils Relative %: 51 %
Platelets: 245 10*3/uL (ref 150–400)
RBC: 4.75 MIL/uL (ref 4.22–5.81)
RDW: 12.8 % (ref 11.5–15.5)
WBC: 6.5 10*3/uL (ref 4.0–10.5)
nRBC: 0 % (ref 0.0–0.2)

## 2022-05-10 LAB — POCT URINE DRUG SCREEN - MANUAL ENTRY (I-SCREEN)
POC Amphetamine UR: NOT DETECTED
POC Buprenorphine (BUP): NOT DETECTED
POC Cocaine UR: NOT DETECTED
POC Marijuana UR: POSITIVE — AB
POC Methadone UR: NOT DETECTED
POC Methamphetamine UR: NOT DETECTED
POC Morphine: NOT DETECTED
POC Oxazepam (BZO): NOT DETECTED
POC Oxycodone UR: NOT DETECTED
POC Secobarbital (BAR): NOT DETECTED

## 2022-05-10 LAB — COMPREHENSIVE METABOLIC PANEL
ALT: 163 U/L — ABNORMAL HIGH (ref 0–44)
AST: 143 U/L — ABNORMAL HIGH (ref 15–41)
Albumin: 4.2 g/dL (ref 3.5–5.0)
Alkaline Phosphatase: 61 U/L (ref 38–126)
Anion gap: 14 (ref 5–15)
BUN: 5 mg/dL — ABNORMAL LOW (ref 6–20)
CO2: 21 mmol/L — ABNORMAL LOW (ref 22–32)
Calcium: 9 mg/dL (ref 8.9–10.3)
Chloride: 102 mmol/L (ref 98–111)
Creatinine, Ser: 0.67 mg/dL (ref 0.61–1.24)
GFR, Estimated: >60 mL/min (ref >60)
Glucose, Bld: 100 mg/dL — ABNORMAL HIGH (ref 70–99)
Potassium: 3.7 mmol/L (ref 3.5–5.1)
Sodium: 137 mmol/L (ref 135–145)
Total Bilirubin: 0.6 mg/dL (ref 0.3–1.2)
Total Protein: 8.3 g/dL — ABNORMAL HIGH (ref 6.5–8.1)

## 2022-05-10 LAB — LIPID PANEL
Cholesterol: 248 mg/dL — ABNORMAL HIGH (ref 0–200)
HDL: 110 mg/dL (ref >40)
LDL Cholesterol: 124 mg/dL — ABNORMAL HIGH (ref 0–99)
Total CHOL/HDL Ratio: 2.3 RATIO
Triglycerides: 69 mg/dL (ref <150)
VLDL: 14 mg/dL (ref 0–40)

## 2022-05-10 LAB — RESP PANEL BY RT-PCR (RSV, FLU A&B, COVID)  RVPGX2
Influenza A by PCR: NEGATIVE
Influenza B by PCR: NEGATIVE
Resp Syncytial Virus by PCR: NEGATIVE
SARS Coronavirus 2 by RT PCR: NEGATIVE

## 2022-05-10 LAB — TSH: TSH: 0.472 u[IU]/mL (ref 0.350–4.500)

## 2022-05-10 LAB — POC SARS CORONAVIRUS 2 AG: SARSCOV2ONAVIRUS 2 AG: NEGATIVE

## 2022-05-10 MED ORDER — THIAMINE MONONITRATE 100 MG PO TABS
100.0000 mg | ORAL_TABLET | Freq: Every day | ORAL | Status: DC
  Administered 2022-05-11 – 2022-05-17 (×7): 100 mg via ORAL
  Filled 2022-05-10 (×7): qty 1

## 2022-05-10 MED ORDER — ADULT MULTIVITAMIN W/MINERALS CH
1.0000 | ORAL_TABLET | Freq: Every day | ORAL | Status: DC
  Administered 2022-05-10 – 2022-05-17 (×8): 1 via ORAL
  Filled 2022-05-10 (×8): qty 1

## 2022-05-10 MED ORDER — NICOTINE POLACRILEX 2 MG MT GUM: 2.0000 mg | CHEWING_GUM | OROMUCOSAL | Status: DC | PRN

## 2022-05-10 MED ORDER — QUETIAPINE FUMARATE 50 MG PO TABS
50.0000 mg | ORAL_TABLET | Freq: Every day | ORAL | Status: DC
  Administered 2022-05-10 – 2022-05-16 (×7): 50 mg via ORAL
  Filled 2022-05-10 (×6): qty 1
  Filled 2022-05-10: qty 14
  Filled 2022-05-10: qty 1

## 2022-05-10 MED ORDER — MAGNESIUM HYDROXIDE 400 MG/5ML PO SUSP: 30.0000 mL | Freq: Every day | ORAL | Status: DC | PRN

## 2022-05-10 MED ORDER — LORAZEPAM 1 MG PO TABS
1.0000 mg | ORAL_TABLET | Freq: Two times a day (BID) | ORAL | Status: AC
  Administered 2022-05-13 (×2): 1 mg via ORAL
  Filled 2022-05-10 (×2): qty 1

## 2022-05-10 MED ORDER — PROPRANOLOL HCL 10 MG PO TABS
20.0000 mg | ORAL_TABLET | Freq: Two times a day (BID) | ORAL | Status: DC
  Administered 2022-05-10 – 2022-05-16 (×12): 20 mg via ORAL
  Filled 2022-05-10 (×12): qty 2

## 2022-05-10 MED ORDER — LORAZEPAM 1 MG PO TABS
1.0000 mg | ORAL_TABLET | Freq: Four times a day (QID) | ORAL | Status: AC | PRN
  Administered 2022-05-13: 1 mg via ORAL
  Filled 2022-05-10: qty 1

## 2022-05-10 MED ORDER — ALUM & MAG HYDROXIDE-SIMETH 200-200-20 MG/5ML PO SUSP: 30.0000 mL | ORAL | Status: DC | PRN

## 2022-05-10 MED ORDER — LORAZEPAM 1 MG PO TABS
1.0000 mg | ORAL_TABLET | Freq: Three times a day (TID) | ORAL | Status: AC
  Administered 2022-05-12 (×3): 1 mg via ORAL
  Filled 2022-05-10 (×3): qty 1

## 2022-05-10 MED ORDER — LORAZEPAM 1 MG PO TABS
1.0000 mg | ORAL_TABLET | Freq: Every day | ORAL | Status: AC
  Administered 2022-05-14: 1 mg via ORAL
  Filled 2022-05-10: qty 1

## 2022-05-10 MED ORDER — LOPERAMIDE HCL 2 MG PO CAPS: 2.0000 mg | ORAL_CAPSULE | ORAL | Status: AC | PRN

## 2022-05-10 MED ORDER — HYDROXYZINE HCL 25 MG PO TABS
25.0000 mg | ORAL_TABLET | Freq: Three times a day (TID) | ORAL | Status: DC | PRN
  Administered 2022-05-13 – 2022-05-16 (×4): 25 mg via ORAL
  Filled 2022-05-10 (×5): qty 1

## 2022-05-10 MED ORDER — HYDROXYZINE HCL 25 MG PO TABS: 25.0000 mg | ORAL_TABLET | Freq: Four times a day (QID) | ORAL | Status: DC | PRN

## 2022-05-10 MED ORDER — ONDANSETRON 4 MG PO TBDP
4.0000 mg | ORAL_TABLET | Freq: Four times a day (QID) | ORAL | Status: AC | PRN
  Administered 2022-05-13: 4 mg via ORAL
  Filled 2022-05-10: qty 1

## 2022-05-10 MED ORDER — NICOTINE 21 MG/24HR TD PT24
21.0000 mg | MEDICATED_PATCH | Freq: Every day | TRANSDERMAL | Status: DC
  Administered 2022-05-11 – 2022-05-17 (×7): 21 mg via TRANSDERMAL
  Filled 2022-05-10 (×5): qty 1
  Filled 2022-05-10: qty 14
  Filled 2022-05-10 (×2): qty 1

## 2022-05-10 MED ORDER — LISINOPRIL 20 MG PO TABS
20.0000 mg | ORAL_TABLET | Freq: Every day | ORAL | Status: DC
  Administered 2022-05-10 – 2022-05-17 (×8): 20 mg via ORAL
  Filled 2022-05-10 (×2): qty 1
  Filled 2022-05-10: qty 14
  Filled 2022-05-10 (×6): qty 1

## 2022-05-10 MED ORDER — ACETAMINOPHEN 325 MG PO TABS: 650.0000 mg | ORAL_TABLET | Freq: Four times a day (QID) | ORAL | Status: DC | PRN

## 2022-05-10 MED ORDER — PAROXETINE HCL 20 MG PO TABS
40.0000 mg | ORAL_TABLET | Freq: Every day | ORAL | Status: DC
  Administered 2022-05-10 – 2022-05-17 (×8): 40 mg via ORAL
  Filled 2022-05-10 (×7): qty 2
  Filled 2022-05-10: qty 28
  Filled 2022-05-10: qty 2

## 2022-05-10 MED ORDER — LORAZEPAM 1 MG PO TABS
1.0000 mg | ORAL_TABLET | Freq: Four times a day (QID) | ORAL | Status: AC
  Administered 2022-05-10 – 2022-05-11 (×6): 1 mg via ORAL
  Filled 2022-05-10 (×6): qty 1

## 2022-05-10 NOTE — BH Assessment (Signed)
Comprehensive Clinical Assessment (CCA) Note  05/10/2022 GENOVEVO GURUNG [id number]  Chief Complaint: The patient is a 42 year old male with with a history of alcohol use disorder, GAD, OCD, and PTSD, diagnoses made by his outpatient psychiatric provider.  The patient has a history of psychiatric admission in 2018, in which his girlfriend called the police because she was worried that he was suicidal.  No documented previous self-harm behavior or suicide attempts.   The patient presented on 3/4 with his mother for assessment at the behavioral health urgent care.  He was referred for consideration of facility based crisis admission by his outpatient psychiatric provider.  The patient reports that alcohol has caused significant problems for him throughout his life.  He reports a DUI 6 years ago, issues with anger when intoxicated, problems showing up to work, and interpersonal dysfunction with romantic partners and his family.  He states that he desires to quit and says "I am a slave to it".  He reports that he normally experiences some alcohol withdrawal symptoms such as "cranky and shaky".  He denies experiencing seizures or delirium tremens.  He reports that he drinks approximately 15 to 1812 ounce beers per day.  He states that he has never done detox before.  He is uncertain what he wants after detox, but he says he may be interested in residential rehab.  His mother states that she would like for [place] to investigate self-pay options in addition to [facility] residential.   The patient denies experiencing recent depression or suicidal thoughts.  He reports smoking cigarettes and some marijuana recently.  He denies use of opioids.   Social history is notable for the patient being born and raised in [place] by his mother and father, the patient completing high school and some trade school, and working as a Dealer for some period of time.  The patient has been unemployed for several months and has been sitting  around the house for most of the day.  He reports that he has his own house but that his parents pay for all that he has.  He reports no previous marriage or romantic partners longer than 3 years.   Chief Complaint  Patient presents with   Alcohol Problem   Visit Diagnosis: Alcohol Use Disorder, Severe    CCA Screening, Triage and Referral (STR)  Patient Reported Information How did you hear about [place]? Family/Friend  What Is the Reason for Your Visit/Call Today? Pt presents to [facility] accompanied by his mother seeking detox and alcohol use treatment. Pt reports consuming 7-8 beers 12oz daily, last use was today.  Pt reports being diagnosed with PTSD,Bipolar disorder, ADHD. Pt denies SI/HI and AVH.  How Long Has This Been Causing You Problems? No data recorded What Do You Feel Would Help You the Most Today? Alcohol or Drug Use Treatment   Have You Recently Had Any Thoughts About Hurting Yourself? No  Are You Planning to Commit Suicide/Harm Yourself At This time? No   Flowsheet Row ED from 05/10/2022 in [facility] Counselor from 09/23/2021 in [facility] Counselor from 04/01/2021 in [place] No Risk No Risk No Risk       Have you Recently Had Thoughts About [place]? No  Are You Planning to Harm Someone at This Time? No  Explanation: No data recorded  Have You Used Any Alcohol or Drugs in the Past 24 Hours? Yes  What Did  You Use and How Much? alcohol 7-8, 12oz beers. Marijuana use today, unknown amount.   Do You Currently Have a Therapist/Psychiatrist? No data recorded Name of Therapist/Psychiatrist:    Have You Been Recently Discharged From Any Office Practice or Programs? No data recorded Explanation of Discharge From Practice/Program: No data recorded    CCA Screening Triage Referral Assessment Type of Contact: No data recorded Telemedicine Service  Delivery:   Is this Initial or Reassessment?   Date Telepsych consult ordered in [facility]:    Time Telepsych consult ordered in [facility]:    Location of Assessment: No data recorded Provider Location: No data recorded  Collateral Involvement: No data recorded  Does Patient Have a [place]? No data recorded Legal Guardian Contact Information: No data recorded Copy of Legal Guardianship Form: No data recorded Legal Guardian Notified of Arrival: No data recorded Legal Guardian Notified of Pending Discharge: No data recorded If Minor and Not Living with Parent(s), Who has Custody? No data recorded Is CPS involved or ever been involved? No data recorded Is APS involved or ever been involved? No data recorded  Patient Determined To Be At Risk for Harm To Self or Others Based on Review of Patient Reported Information or Presenting Complaint? No data recorded Method: No data recorded Availability of Means: No data recorded Intent: No data recorded Notification Required: No data recorded Additional Information for Danger to Others Potential: No data recorded Additional Comments for Danger to Others Potential: No data recorded Are There Guns or Other Weapons in Your Home? No data recorded Types of Guns/Weapons: No data recorded Are These Weapons Safely Secured?                            No data recorded Who Could Verify You Are Able To Have These Secured: No data recorded Do You Have any Outstanding Charges, Pending Court Dates, Parole/Probation? No data recorded Contacted To Inform of Risk of Harm To Self or Others: No data recorded   Does Patient Present under Involuntary Commitment? No data recorded   [place] of Residence: No data recorded  Patient Currently Receiving the Following Services: No data recorded  Determination of Need: Urgent (48 hours)   Options For Referral: Facility-Based Crisis; Outpatient Therapy; Medication Management     CCA  Biopsychosocial Patient Reported Schizophrenia/Schizoaffective Diagnosis in Past: No   Strengths: willing to engage in treatment, get help with alcohol abuse   Mental Health Symptoms Depression:   Difficulty Concentrating; Fatigue; Hopelessness; Irritability   Duration of Depressive symptoms:  Duration of Depressive Symptoms: Greater than two weeks   Mania:   Irritability; Racing thoughts   Anxiety:    Tension; Worrying; Sleep; Restlessness; Irritability; Fatigue; Difficulty concentrating   Psychosis:   None   Duration of Psychotic symptoms:    Trauma:   Detachment from others; Irritability/anger   Obsessions:   None   Compulsions:   None   Inattention:   None   Hyperactivity/Impulsivity:   None   Oppositional/Defiant Behaviors:   None   Emotional Irregularity:   N/A   Other Mood/Personality Symptoms:  No data recorded   Mental Status Exam Appearance and self-care  Stature:   Average   Weight:   Average weight   Clothing:   Casual   Grooming:  No data recorded  Cosmetic use:   None   Posture/gait:   Normal   Motor activity:   Not Remarkable   Sensorium  Attention:   Normal   Concentration:   Normal   Orientation:   X5   Recall/memory:   Normal   Affect and Mood  Affect:   Anxious   Mood:   Anxious   Relating  Eye contact:   Normal   Facial expression:   Responsive   Attitude toward examiner:   Cooperative   Thought and Language  Speech flow:  Clear and Coherent   Thought content:   Appropriate to Mood and Circumstances   Preoccupation:   None   Hallucinations:   None   Organization:   Coherent   [employer] of Knowledge:   Fair   Intelligence:   Average   Abstraction:   Concrete   Judgement:   Fair   Art therapist:   Realistic   Insight:   Good   Decision Making:   Normal   Social Functioning  Social Maturity:   Isolates   Social Judgement:   Normal   Stress   Stressors:   Museum/gallery curator; Other (Comment) (life stressors)   Coping Ability:   Overwhelmed   Skill Deficits:  No data recorded  Supports:   Friends/Service system; Family     Religion: Religion/Spirituality Are You A Religious Person?: No  Leisure/Recreation: Leisure / Recreation Do You Have Hobbies?: Yes Leisure and Hobbies: climbing, fixing things, building things   Exercise/Diet: Exercise/Diet Do You Exercise?: No Have You Gained or Lost A Significant Amount of Weight in the Past Six Months?: No Do You Follow a Special Diet?: No Do You Have Any Trouble Sleeping?: No Explanation of Sleeping Difficulties: denied having issues with sleeping report sleep 8-9 hours per night.   CCA Employment/Education Employment/Work Situation: Employment / Work Situation Employment Situation: Unemployed Has Patient ever Been in Passenger transport manager?: No  Education: Education Last Grade Completed: 12 Did You Nutritional therapist?: Yes What Type of College Degree Do you Have?: did not graduate Did You Have An Individualized Education Program (IIEP): Yes Did You Have Any Difficulty At School?: Yes Were Any Medications Ever Prescribed For These Difficulties?: No Patient's Education Has Been Impacted by Current Illness: No   CCA Family/Childhood History Family and Relationship History:    Childhood History:  Childhood History By whom was/is the patient raised?: Both parents Did patient suffer any verbal/emotional/physical/sexual abuse as a child?: No Did patient suffer from severe childhood neglect?: No Has patient ever been sexually abused/assaulted/raped as an adolescent or adult?: No Was the patient ever a victim of a crime or a disaster?: No Witnessed domestic violence?: No Has patient been affected by domestic violence as an adult?: No       CCA Substance Use Alcohol/Drug Use: Alcohol / Drug Use Pain Medications: See MAR Prescriptions: See MAR Over the Counter: See MAR History  of alcohol / drug use?: Yes Longest period of sobriety (when/how long): unknown Withdrawal Symptoms: Irritability Substance #1 Name of Substance 1: Alcohol 1 - Age of First Use: 15 1 - Amount (size/oz): 15-18 beer per day 1 - Frequency: daily 1 - Duration: ongoing 1 - Last Use / Amount: 05/10/2022 1 - Method of Aquiring: oral 1- Route of Use: oral                       ASAM's:  Six Dimensions of Multidimensional Assessment  Dimension 1:  Acute Intoxication and/or Withdrawal Potential:   Dimension 1:  Description of individual's past and current experiences of substance use and withdrawal: history of substance  use and withdrawal  Dimension 2:  Biomedical Conditions and Complications:   Dimension 2:  Description of patient's biomedical conditions and  complications: history of mental health PTSD and depression  Dimension 3:  Emotional, Behavioral, or Cognitive Conditions and Complications:  Dimension 3:  Description of emotional, behavioral, or cognitive conditions and complications: history of depression and bipolar  Dimension 4:  Readiness to Change:  Dimension 4:  Description of Readiness to Change criteria: ready to change and seeking treatment  Dimension 5:  Relapse, Continued use, or Continued Problem Potential:  Dimension 5:  Relapse, continued use, or continued problem potential critiera description: history of previous treatment options  Dimension 6:  Recovery/Living Environment:     ASAM Severity Score: ASAM's Severity Rating Score: 10  ASAM Recommended Level of Treatment:     Substance use Disorder (SUD) Substance Use Disorder (SUD)  Checklist Symptoms of Substance Use: Continued use despite having a persistent/recurrent physical/psychological problem caused/exacerbated by use, Substance(s) often taken in larger amounts or over longer times than was intended, Evidence of tolerance, Large amounts of time spent to obtain, use or recover from the substance(s), Evidence of  withdrawal (Comment), Persistent desire or unsuccessful efforts to cut down or control use, Presence of craving or strong urge to use  Recommendations for Services/Supports/Treatments: Recommendations for Services/Supports/Treatments Recommendations For Services/Supports/Treatments: Inpatient Hospitalization  Discharge Disposition:    DSM5 Diagnoses: Patient Active Problem List   Diagnosis Date Noted   Alcohol use disorder 05/10/2022   GAD (generalized anxiety disorder) 07/07/2021   Agoraphobia 07/07/2021   Bipolar 1 disorder, mixed, moderate ([place]) 05/13/2021   Substance induced mood disorder ([place]) 04/01/2021   PTSD (post-traumatic stress disorder) 04/01/2021   Alcohol abuse with alcohol-induced mood disorder ([place]) 01/26/2017   Intermittent explosive disorder 01/26/2017     Referrals to Alternative Service(s): Referred to Alternative Service(s):   Place:   Date:   Time:    Referred to Alternative Service(s):   Place:   Date:   Time:    Referred to Alternative Service(s):   Place:   Date:   Time:    Referred to Alternative Service(s):   Place:   Date:   Time:     Zylpha Poynor, LCAS

## 2022-05-10 NOTE — ED Notes (Signed)
Pt is in the bed sleeping. Respirations are even and unlabored. No acute distress noted. Will continue to monitor for safety. 

## 2022-05-10 NOTE — ED Triage Notes (Signed)
Pt presents to [facility] accompanied by his mother seeking detox and alcohol use treatment. Pt reports consuming 7-8 beers 12oz daily, last use was today. Pt reports being diagnosed with PTSD,Bipolar disorder, ADHD. Pt denies SI/HI and AVH.

## 2022-05-10 NOTE — ED Provider Notes (Signed)
Facility Based Crisis Admission H&P  Date: 05/10/22 Patient Name: [name] MRN: [id number] Chief Complaint: Alcohol use  Diagnoses:  Final diagnoses:  Alcohol use disorder    HPI:  The patient is a 42 year old male with with a history of alcohol use disorder, GAD, OCD, and PTSD, diagnoses made by his outpatient psychiatric provider.  The patient has a history of psychiatric admission in 2018, in which his girlfriend called the police because she was worried that he was suicidal.  No documented previous self-harm behavior or suicide attempts.  The patient presented on 3/4 with his mother for assessment at the behavioral health urgent care.  He was referred for consideration of facility based crisis admission by his outpatient psychiatric provider.  The patient reports that alcohol has caused significant problems for him throughout his life.  He reports a DUI 6 years ago, issues with anger when intoxicated, problems showing up to work, and interpersonal dysfunction with romantic partners and his family.  He states that he desires to quit and says "I am a slave to it".  He reports that he normally experiences some alcohol withdrawal symptoms such as "cranky and shaky".  He denies experiencing seizures or delirium tremens.  He reports that he drinks approximately 15 to 1812 ounce beers per day.  He states that he has never done detox before.  He is uncertain what he wants after detox, but he says he may be interested in residential rehab.  His mother states that she would like for [place] to investigate self-pay options in addition to [facility] residential.  The patient denies experiencing recent depression or suicidal thoughts.  He reports smoking cigarettes and some marijuana recently.  He denies use of opioids.  Social history is notable for the patient being born and raised in [place] by his mother and father, the patient completing high school and some trade school, and working as a Dealer for  some period of time.  The patient has been unemployed for several months and has been sitting around the house for most of the day.  He reports that he has his own house but that his parents pay for all that he has.  He reports no previous marriage or romantic partners longer than 3 years.  PHQ 2-9:  Health and safety inspector from 11/10/2021 in [facility] Counselor from 09/23/2021 in [facility] Counselor from 09/01/2021 in [facility]  Thoughts that you would be better off dead, or of hurting yourself in some way Not at all Not at all Not at all  PHQ-9 Total Score '3 5 3       '$ Flowsheet Row ED from 05/10/2022 in [facility] Counselor from 09/23/2021 in [facility] Counselor from 04/01/2021 in [place] No Risk No Risk No Risk       Patient is instructed prior to discharge to:  Take all medications as prescribed by his/her mental healthcare provider. Report any adverse effects and or reactions from the medicines to his/her outpatient provider promptly. Keep all scheduled appointments, to ensure that you are getting refills on time and to avoid any interruption in your medication.  If you are unable to keep an appointment call to reschedule.  Be sure to follow-up with resources and follow-up appointments provided.  Patient has been instructed & cautioned: To not engage in alcohol and or illegal drug use while on prescription  medicines. In the event of worsening symptoms, patient is instructed to call the crisis hotline, 911 and or go to the nearest ED for appropriate evaluation and treatment of symptoms. To follow-up with his/her primary care provider for your other medical issues, concerns and or health care needs.  Total Time spent with patient: 20 minutes  Musculoskeletal  Strength & Muscle Tone: within  normal limits Gait & Station: normal Patient leans: N/A  Psychiatric Specialty Exam  Presentation General Appearance: Appropriate for Environment  Eye Contact:Fair  Speech:Clear and Coherent  Speech Volume:Normal  Handedness:-- (not assessed)   Mood and Affect  Mood:Euthymic  Affect:Congruent   Thought Process  Thought Processes:Coherent; Linear  Descriptions of Associations:Intact  Orientation:Full (Time, Place and Person)  Thought Content:Logical    Hallucinations:Hallucinations: None  Ideas of Reference:None  Suicidal Thoughts:Suicidal Thoughts: No  Homicidal Thoughts:Homicidal Thoughts: No   Sensorium  Memory:Immediate Fair; Recent Fair; Remote Fair  Judgment:Fair  Insight:Fair   Executive Functions  Concentration:Fair  Attention Span:Fair  [place]   Psychomotor Activity  Psychomotor Activity:Psychomotor Activity: Normal   Assets  Assets:Communication Skills; Resilience   Sleep  Sleep:Sleep: Fair   Nutritional Assessment (For OBS and FBC admissions only) Has the patient had a weight loss or gain of 10 pounds or more in the last 3 months?: No Has the patient had a decrease in food intake/or appetite?: Yes Does the patient have dental problems?: No Does the patient have eating habits or behaviors that may be indicators of an eating disorder including binging or inducing vomiting?: No Has the patient recently lost weight without trying?: 0 Has the patient been eating poorly because of a decreased appetite?: 0 Malnutrition Screening Tool Score: 0    Physical Exam Constitutional:      Appearance: the patient is not toxic-appearing.  Pulmonary:     Effort: Pulmonary effort is normal.  Neurological:     General: No focal deficit present.     Mental Status: the patient is alert and oriented to person, place, and time.   Review of Systems  Respiratory:  Negative for shortness of breath.    Cardiovascular:  Negative for chest pain.  Gastrointestinal:  Negative for abdominal pain, constipation, diarrhea, nausea and vomiting.  Neurological:  Negative for headaches.    BP (!) 155/103 (BP Location: Left Arm)   Pulse (!) 125   Temp 98.2 F (36.8 C) (Oral)   Resp 18   SpO2 95%    Past Psychiatric History: as above   Is the patient at risk to self? No  Has the patient been a risk to self in the past 6 months? No .    Has the patient been a risk to self within the distant past? No   Is the patient a risk to others? No   Has the patient been a risk to others in the past 6 months? No   Has the patient been a risk to others within the distant past? No   Past Medical History: as above Family History: none Social History: as above   Last Labs:  Orders Only on 12/29/2021  Component Date Value Ref Range Status   WBC 12/29/2021 5.8  3.4 - 10.8 x10E3/uL Final   RBC 12/29/2021 4.75  4.14 - 5.80 x10E6/uL Final   Hemoglobin 12/29/2021 15.4  13.0 - 17.7 g/dL Final   Hematocrit 12/29/2021 45.1  37.5 - 51.0 % Final   MCV 12/29/2021 95  79 - 97 fL Final  MCH 12/29/2021 32.4  26.6 - 33.0 pg Final   MCHC 12/29/2021 34.1  31.5 - 35.7 g/dL Final   RDW 12/29/2021 12.9  11.6 - 15.4 % Final   Platelets 12/29/2021 230  150 - 450 x10E3/uL Final   Neutrophils 12/29/2021 54  Not Estab. % Final   Lymphs 12/29/2021 26  Not Estab. % Final   Monocytes 12/29/2021 16  Not Estab. % Final   Eos 12/29/2021 3  Not Estab. % Final   Basos 12/29/2021 1  Not Estab. % Final   Neutrophils Absolute 12/29/2021 3.1  1.4 - 7.0 x10E3/uL Final   Lymphocytes Absolute 12/29/2021 1.5  0.7 - 3.1 x10E3/uL Final   Monocytes Absolute 12/29/2021 0.9  0.1 - 0.9 x10E3/uL Final   EOS (ABSOLUTE) 12/29/2021 0.2  0.0 - 0.4 x10E3/uL Final   Basophils Absolute 12/29/2021 0.1  0.0 - 0.2 x10E3/uL Final   Immature Granulocytes 12/29/2021 0  Not Estab. % Final   Immature Grans (Abs) 12/29/2021 0.0  0.0 - 0.1 x10E3/uL Final    Glucose 12/29/2021 105 (H)  70 - 99 mg/dL Final   BUN 12/29/2021 7  6 - 20 mg/dL Final   Creatinine, Ser 12/29/2021 0.73 (L)  0.76 - 1.27 mg/dL Final   eGFR 12/29/2021 119  >59 mL/min/1.73 Final   BUN/Creatinine Ratio 12/29/2021 10  9 - 20 Final   Sodium 12/29/2021 139  134 - 144 mmol/L Final   Potassium 12/29/2021 4.0  3.5 - 5.2 mmol/L Final   Chloride 12/29/2021 103  96 - 106 mmol/L Final   CO2 12/29/2021 22  20 - 29 mmol/L Final   Calcium 12/29/2021 9.0  8.7 - 10.2 mg/dL Final   Total Protein 12/29/2021 6.9  6.0 - 8.5 g/dL Final   Albumin 12/29/2021 4.2  4.1 - 5.1 g/dL Final   Globulin, Total 12/29/2021 2.7  1.5 - 4.5 g/dL Final   Albumin/Globulin Ratio 12/29/2021 1.6  1.2 - 2.2 Final   Bilirubin Total 12/29/2021 <0.2  0.0 - 1.2 mg/dL Final   Alkaline Phosphatase 12/29/2021 74  44 - 121 IU/L Final   AST 12/29/2021 87 (H)  0 - 40 IU/L Final   ALT 12/29/2021 76 (H)  0 - 44 IU/L Final   Cholesterol, Total 12/29/2021 169  100 - 199 mg/dL Final   Triglycerides 12/29/2021 61  0 - 149 mg/dL Final   HDL 12/29/2021 77  >39 mg/dL Final   VLDL Cholesterol Cal 12/29/2021 12  5 - 40 mg/dL Final   LDL Chol Calc (NIH) 12/29/2021 80  0 - 99 mg/dL Final   Chol/HDL Ratio 12/29/2021 2.2  0.0 - 5.0 ratio Final   Comment:                                   T. Chol/HDL Ratio                                             Men  Women                               1/2 Avg.Risk  3.4    3.3  Avg.Risk  5.0    4.4                                2X Avg.Risk  9.6    7.1                                3X Avg.Risk 23.4   11.0    Hgb A1c MFr Bld 12/29/2021 5.7 (H)  4.8 - 5.6 % Final   Comment:          Prediabetes: 5.7 - 6.4          Diabetes: >6.4          Glycemic control for adults with diabetes: <7.0    Est. average glucose Bld gHb Est-m* 12/29/2021 117  mg/dL Final   specimen status report 12/29/2021 Comment   Final   Comment: Please note Please note The date and/or time  of collection was not indicated on the requisition as required by state and federal law.  The date of receipt of the specimen was used as the collection date if not supplied.     Allergies: Patient has no known allergies.  Medications:  Facility Ordered Medications  Medication   acetaminophen (TYLENOL) tablet 650 mg   alum & mag hydroxide-simeth (MAALOX/MYLANTA) 200-200-20 MG/5ML suspension 30 mL   magnesium hydroxide (MILK OF MAGNESIA) suspension 30 mL   hydrOXYzine (ATARAX) tablet 25 mg   [START ON 05/11/2022] nicotine (NICODERM CQ - dosed in mg/24 hours) patch 21 mg   multivitamin with minerals tablet 1 tablet   LORazepam (ATIVAN) tablet 1 mg   loperamide (IMODIUM) capsule 2-4 mg   ondansetron (ZOFRAN-ODT) disintegrating tablet 4 mg   LORazepam (ATIVAN) tablet 1 mg   Followed by   Derrill Memo ON 05/12/2022] LORazepam (ATIVAN) tablet 1 mg   Followed by   Derrill Memo ON 05/13/2022] LORazepam (ATIVAN) tablet 1 mg   Followed by   Derrill Memo ON 05/14/2022] LORazepam (ATIVAN) tablet 1 mg   [START ON 05/11/2022] thiamine (VITAMIN B1) tablet 100 mg   nicotine polacrilex (NICORETTE) gum 2 mg   lisinopril (ZESTRIL) tablet 20 mg   PARoxetine (PAXIL) tablet 40 mg   propranolol (INDERAL) tablet 20 mg   QUEtiapine (SEROQUEL) tablet 50 mg   PTA Medications  Medication Sig   lisinopril (ZESTRIL) 20 MG tablet Take 1 tablet (20 mg total) by mouth daily.   PARoxetine (PAXIL) 40 MG tablet Take 1 tablet (40 mg total) by mouth daily.   propranolol (INDERAL) 40 MG tablet Take 1 tablet (40 mg total) by mouth 2 (two) times daily.   QUEtiapine (SEROQUEL) 200 MG tablet Take 1 tablet (200 mg total) by mouth at bedtime.    Long Term Goals: Improvement in symptoms so as ready for discharge  Short Term Goals: Patient will verbalize feelings in meetings with treatment team members., Patient will attend at least of 50% of the groups daily., Pt will complete the PHQ9 on admission, day 3 and discharge., Patient will  participate in completing the [place], Patient will score a low risk of violence for 24 hours prior to discharge, and Patient will take medications as prescribed daily.  Medical Decision Making   Status: Voluntary, may be discharged AMA should the patient request, given lack of documented self-harm behavior and no apparent suicidal thoughts  Alcohol use disorder, history of moderate withdrawal  symptoms - Ativan taper with CIWA and as needed Ativan  Restart home medications, different dosages been as documented in the medical record, given the patient's lack of adherence - Propranolol 20 mg twice daily - Seroquel 50 mg nightly - Paxil 40 mg daily - Lisinopril 20 mg daily  Medical: Adding regular propranolol and lisinopril should help with hypertension and tachycardia.  The patient's hypertension is consistent with the patient's vitals at outpatient clinic. EKG to rule out other causes of tachycardia Routine labs      Recommendations  Based on my evaluation the patient does not appear to have an emergency medical condition.  Corky Sox, MD 05/10/22  3:39 PM

## 2022-05-10 NOTE — ED Notes (Addendum)
Pt admitted to [facility] requesting ETOH detox and possibly residential. Pt states, "I need to get my head on straight so I can figure out my next move. First I have to get this ETOH out of my system. I drank beer this morning. I drink everyday (laughing)". Calm, cooperative throughout interview process. Skin assessment completed. Oriented to unit. Meal and drink offered. Pt denies SI/HI/AVH. Pt denies withdrawal sx from ETOH. Pt verbally contract for safety. Will monitor for safety.

## 2022-05-10 NOTE — Discharge Instructions (Addendum)
List of Residential placements:   [facility]: [phone]  Benita Stabile, Startup: Male and male facility; 30-day program: (uninsured and Medicaid such as Deloria Lair, [place], [place] [place], partners)  [place]: [phone]; men and women's facility; 28 days; Can have Medicaid tailored plan Publishing rights manager or Partners)  [place]: [phone] Janace Hoard or Jeani Hawking; 28 day program; must be fully detox; tailored Medicaid or no insurance  [facility] in [place], [place]; [phone]; 28 day all males program; no insurance accepted  [facility] Referral in [place]: Wille Glaser [phone] (no insurance or Medicaid only); 90 days; outpatient services but provide housing in apartments downtown [place] Admission: [phone]: Patient must complete phone screening for placement: [place], [place]; 6 month program; uninsured, Medicaid, and [employer].   [facility]: no insurance required; [place]: [phone]; Intake: Herbie Baltimore; Must fill out application online; Christy Sartorius Delay [phone] x [place] in [place], [place]: [phone]; Admissions Coordinators Mr. Simona Huh or Jene Every; 90 day program.  [facility]: [place], [place] [phone]; Co-Ed 9 month to a year program; Online application; Men entry fee is $500 (6-3month);  [employer] [address] [place] [phone] no fee or insurance required; minimum of 2 years; Highly structured; work based; Intake Coordinator is CGerald [phone] [facility] [place] [phone] Fax number is [phone] website: [email or web address]; Requires 3-6 page autobiography; 2 year program (18 months and then 654monthransitional housing); Admission fee is $300; no insurance needed; work prFacilities managern [place]: ReMichel [phone] have a  Men's Regenerations Program 6-67m167monthFree program; There is an initial $300 fee however, they are willing to work with patients regarding that. Application is online.  [facility] [address]; Any 7-90 day program is out of pocket; 12 month program is free of charge; there is a $275 entry fee; Patient is responsible for own transportation      [phone] phone  New Patient Assessment/Therapy Walk-Ins:  Monday and Wednesday: 8 am until slots are full. Every 1st and 2nd Fridays of the month: 1 pm - 5 pm.  NO ASSESSMENT/THERAPY WALK-INS ON TUETrumannew Patient Assessment/Medication Management Walk-Ins:  Monday - Friday:  8 am - 11 am.  For all walk-ins, we ask that you arrive by 7:30 am because patients will be seen in the order of arrival.  Availability is limited; therefore, you may not be seen on the same day that you walk-in.  Our goal is to serve and meet the needs of our community to the best of our ability.  SUBSTANCE USE TREATMENT for Medicaid and State Funded/IPRS  [facility]) [address],[phone] phone NOTE: [facility] is no longer offering IOP services.  Serves those who are low-income or have no insurance.  [facility] [address],[phone] NOTE: Does have Substance Abuse-Intensive Outpatient Program ([facility] well as transitional housing if eligible.  [phone] phone [phone]  [place] [phone] HigRosh[address],[phone] phone [phone]  HALFWAY HOUSES:  Friends of [phone] Corporationfordvacancies.com  12 STEP PROGRAMS:  Alcoholics Anonymous of [place]  httReportZoo.com.cyarcotics Anonymous  of [place].dk  Al-Anon of [employer], [place] www.[place]on.org/find-meetings.html  Nar-Anon https://nar-anon.org/find-[email or web address]-meetin

## 2022-05-11 ENCOUNTER — Encounter (HOSPITAL_COMMUNITY): Payer: Self-pay | Admitting: Student

## 2022-05-11 DIAGNOSIS — Z1152 Encounter for screening for COVID-19: Secondary | ICD-10-CM | POA: Diagnosis not present

## 2022-05-11 DIAGNOSIS — F4310 Post-traumatic stress disorder, unspecified: Secondary | ICD-10-CM | POA: Diagnosis not present

## 2022-05-11 DIAGNOSIS — I10 Essential (primary) hypertension: Secondary | ICD-10-CM | POA: Diagnosis not present

## 2022-05-11 DIAGNOSIS — R000 Tachycardia, unspecified: Secondary | ICD-10-CM | POA: Diagnosis not present

## 2022-05-11 DIAGNOSIS — F17210 Nicotine dependence, cigarettes, uncomplicated: Secondary | ICD-10-CM | POA: Diagnosis not present

## 2022-05-11 DIAGNOSIS — F319 Bipolar disorder, unspecified: Secondary | ICD-10-CM | POA: Diagnosis not present

## 2022-05-11 DIAGNOSIS — F1090 Alcohol use, unspecified, uncomplicated: Secondary | ICD-10-CM | POA: Diagnosis not present

## 2022-05-11 DIAGNOSIS — F429 Obsessive-compulsive disorder, unspecified: Secondary | ICD-10-CM | POA: Diagnosis not present

## 2022-05-11 DIAGNOSIS — F411 Generalized anxiety disorder: Secondary | ICD-10-CM | POA: Diagnosis not present

## 2022-05-11 LAB — HEMOGLOBIN A1C
Hgb A1c MFr Bld: 5.8 % — ABNORMAL HIGH (ref 4.8–5.6)
Mean Plasma Glucose: 120 mg/dL

## 2022-05-11 NOTE — ED Provider Notes (Signed)
Behavioral Health Progress Note  Date and Time: 05/11/2022 12:22 PM Name: DIX HOVEL MRN:  [id number]  Subjective:    On reevaluation 3/5, the patient reports that he is experiencing no withdrawal symptoms.  He endorses interest in residential rehab.  LCSW to investigate [employer] and self-pay options.  The patient is a 42 year old male with with a history of alcohol use disorder, GAD, OCD, and PTSD, diagnoses made by his outpatient psychiatric provider.  The patient has a history of psychiatric admission in 2018, in which his girlfriend called the police because she was worried that he was suicidal.  No documented previous self-harm behavior or suicide attempts.   The patient presented on 3/4 with his mother for assessment at the behavioral health urgent care.  He was referred for consideration of facility based crisis admission by his outpatient psychiatric provider.  The patient reports that alcohol has caused significant problems for him throughout his life.  He reports a DUI 6 years ago, issues with anger when intoxicated, problems showing up to work, and interpersonal dysfunction with romantic partners and his family.  He states that he desires to quit and says "I am a slave to it".  He reports that he normally experiences some alcohol withdrawal symptoms such as "cranky and shaky".  He denies experiencing seizures or delirium tremens.  He reports that he drinks approximately 15 to 1812 ounce beers per day.  He states that he has never done detox before.  He is uncertain what he wants after detox, but he says he may be interested in residential rehab.  His mother states that she would like for [place] to investigate self-pay options in addition to [facility] residential.   The patient denies experiencing recent depression or suicidal thoughts.  He reports smoking cigarettes and some marijuana recently.  He denies use of opioids.   Social history is notable for the patient being born and raised in  [place] by his mother and father, the patient completing high school and some trade school, and working as a Dealer for some period of time.  The patient has been unemployed for several months and has been sitting around the house for most of the day.  He reports that he has his own house but that his parents pay for all that he has.  He reports no previous marriage or romantic partners longer than 3 years.    Diagnosis:  Final diagnoses:  Alcohol use disorder   Total Time spent with patient: 20 minutes  Past Psychiatric History: as above Past Medical History: as above Family History: none Family Psychiatric  History: none Social History: as above and per H and P  Additional Social History:  See H and P                  Sleep: Fair  Appetite:  Fair   Current Medications:  Current Facility-Administered Medications  Medication Dose Route Frequency Provider Last Rate Last Admin   acetaminophen (TYLENOL) tablet 650 mg  650 mg Oral Q6H PRN Corky Sox, MD       alum & mag hydroxide-simeth (MAALOX/MYLANTA) 200-200-20 MG/5ML suspension 30 mL  30 mL Oral Q4H PRN Corky Sox, MD       hydrOXYzine (ATARAX) tablet 25 mg  25 mg Oral TID PRN Corky Sox, MD       lisinopril (ZESTRIL) tablet 20 mg  20 mg Oral Daily Corky Sox, MD   20 mg at 05/11/22 0921   loperamide (IMODIUM) capsule 2-4  mg  2-4 mg Oral PRN Corky Sox, MD       LORazepam (ATIVAN) tablet 1 mg  1 mg Oral Q6H PRN Corky Sox, MD       LORazepam (ATIVAN) tablet 1 mg  1 mg Oral QID Corky Sox, MD   1 mg at 05/11/22 [id number]   Followed by   Derrill Memo ON 05/12/2022] LORazepam (ATIVAN) tablet 1 mg  1 mg Oral TID Corky Sox, MD       Followed by   Derrill Memo ON 05/13/2022] LORazepam (ATIVAN) tablet 1 mg  1 mg Oral BID Corky Sox, MD       Followed by   Derrill Memo ON 05/14/2022] LORazepam (ATIVAN) tablet 1 mg  1 mg Oral Daily Corky Sox, MD       magnesium hydroxide (MILK OF MAGNESIA) suspension  30 mL  30 mL Oral Daily PRN Corky Sox, MD       multivitamin with minerals tablet 1 tablet  1 tablet Oral Daily Corky Sox, MD   1 tablet at 05/11/22 [id number]   nicotine (NICODERM CQ - dosed in mg/24 hours) patch 21 mg  21 mg Transdermal Q0600 Corky Sox, MD   21 mg at 05/11/22 [id number]   nicotine polacrilex (NICORETTE) gum 2 mg  2 mg Oral PRN Corky Sox, MD       ondansetron (ZOFRAN-ODT) disintegrating tablet 4 mg  4 mg Oral Q6H PRN Corky Sox, MD       PARoxetine (PAXIL) tablet 40 mg  40 mg Oral Daily Corky Sox, MD   40 mg at 05/11/22 [id number]   propranolol (INDERAL) tablet 20 mg  20 mg Oral BID Corky Sox, MD   20 mg at 05/11/22 [id number]   QUEtiapine (SEROQUEL) tablet 50 mg  50 mg Oral QHS Corky Sox, MD   50 mg at 05/10/22 2112   thiamine (VITAMIN B1) tablet 100 mg  100 mg Oral Daily Corky Sox, MD   100 mg at 05/11/22 [id number]   Current Outpatient Medications  Medication Sig Dispense Refill   lisinopril (ZESTRIL) 20 MG tablet Take 1 tablet (20 mg total) by mouth daily. 30 tablet 1   Multiple Vitamins-Minerals (ADULT GUMMY PO) Take 1 tablet by mouth daily.     PARoxetine (PAXIL) 40 MG tablet Take 1 tablet (40 mg total) by mouth daily. 30 tablet 1   propranolol (INDERAL) 40 MG tablet Take 1 tablet (40 mg total) by mouth 2 (two) times daily. 60 tablet 1   QUEtiapine (SEROQUEL) 200 MG tablet Take 1 tablet (200 mg total) by mouth at bedtime. 30 tablet 1    Labs  Lab Results:  Admission on 05/10/2022  Component Date Value Ref Range Status   SARS Coronavirus 2 by RT PCR 05/10/2022 NEGATIVE  NEGATIVE Final   Influenza A by PCR 05/10/2022 NEGATIVE  NEGATIVE Final   Influenza B by PCR 05/10/2022 NEGATIVE  NEGATIVE Final   Comment: (NOTE) The Xpert Xpress SARS-CoV-2/FLU/RSV plus assay is intended as an aid in the diagnosis of influenza from Nasopharyngeal swab specimens and should not be used as a sole basis for treatment. Nasal washings and aspirates are  unacceptable for Xpert Xpress SARS-CoV-2/FLU/RSV testing.  Fact Sheet for Patients: EntrepreneurPulse.com.au  Fact Sheet for Healthcare Providers: IncredibleEmployment.be  This test is not yet approved or cleared by the [place] FDA and has been authorized for detection and/or diagnosis of SARS-CoV-2 by FDA under an Emergency Use Authorization (EUA). This EUA will remain in effect (meaning this test can be used)  for the duration of the COVID-19 declaration under Section 564(b)(1) of the Act, 21 U.S.C. section 360bbb-3(b)(1), unless the authorization is terminated or revoked.     Resp Syncytial Virus by PCR 05/10/2022 NEGATIVE  NEGATIVE Final   Comment: (NOTE) Fact Sheet for Patients: EntrepreneurPulse.com.au  Fact Sheet for Healthcare Providers: IncredibleEmployment.be  This test is not yet approved or cleared by the [place] FDA and has been authorized for detection and/or diagnosis of SARS-CoV-2 by FDA under an Emergency Use Authorization (EUA). This EUA will remain in effect (meaning this test can be used) for the duration of the COVID-19 declaration under Section 564(b)(1) of the Act, 21 U.S.C. section 360bbb-3(b)(1), unless the authorization is terminated or revoked.  Performed at [facility], [place] [address], [place], [place] [address]    WBC 05/10/2022 6.5  4.0 - 10.5 K/uL Final   RBC 05/10/2022 4.75  4.22 - 5.81 MIL/uL Final   Hemoglobin 05/10/2022 15.9  13.0 - 17.0 g/dL Final   HCT 05/10/2022 44.5  39.0 - 52.0 % Final   MCV 05/10/2022 93.7  80.0 - 100.0 fL Final   MCH 05/10/2022 33.5  26.0 - 34.0 pg Final   MCHC 05/10/2022 35.7  30.0 - 36.0 g/dL Final   RDW 05/10/2022 12.8  11.5 - 15.5 % Final   Platelets 05/10/2022 245  150 - 400 K/uL Final   nRBC 05/10/2022 0.0  0.0 - 0.2 % Final   Neutrophils Relative % 05/10/2022 51  % Final   Neutro Abs 05/10/2022 3.3  1.7 - 7.7 K/uL  Final   Lymphocytes Relative 05/10/2022 33  % Final   Lymphs Abs 05/10/2022 2.2  0.7 - 4.0 K/uL Final   Monocytes Relative 05/10/2022 12  % Final   Monocytes Absolute 05/10/2022 0.8  0.1 - 1.0 K/uL Final   Eosinophils Relative 05/10/2022 2  % Final   Eosinophils Absolute 05/10/2022 0.1  0.0 - 0.5 K/uL Final   Basophils Relative 05/10/2022 1  % Final   Basophils Absolute 05/10/2022 0.1  0.0 - 0.1 K/uL Final   Immature Granulocytes 05/10/2022 1  % Final   Abs Immature Granulocytes 05/10/2022 0.04  0.00 - 0.07 K/uL Final   Performed at [facility], [place] [address], [place], [place] [address]   Sodium 05/10/2022 137  135 - 145 mmol/L Final   Potassium 05/10/2022 3.7  3.5 - 5.1 mmol/L Final   Chloride 05/10/2022 102  98 - 111 mmol/L Final   CO2 05/10/2022 21 (L)  22 - 32 mmol/L Final   Glucose, Bld 05/10/2022 100 (H)  70 - 99 mg/dL Final   Glucose reference range applies only to samples taken after fasting for at least 8 hours.   BUN 05/10/2022 5 (L)  6 - 20 mg/dL Final   Creatinine, Ser 05/10/2022 0.67  0.61 - 1.24 mg/dL Final   Calcium 05/10/2022 9.0  8.9 - 10.3 mg/dL Final   Total Protein 05/10/2022 8.3 (H)  6.5 - 8.1 g/dL Final   Albumin 05/10/2022 4.2  3.5 - 5.0 g/dL Final   AST 05/10/2022 143 (H)  15 - 41 U/L Final   ALT 05/10/2022 163 (H)  0 - 44 U/L Final   Alkaline Phosphatase 05/10/2022 61  38 - 126 U/L Final   Total Bilirubin 05/10/2022 0.6  0.3 - 1.2 mg/dL Final   GFR, Estimated 05/10/2022 >60  >60 mL/min Final   Comment: (NOTE) Calculated using the CKD-EPI Creatinine Equation (2021)    Anion gap 05/10/2022  14  5 - 15 Final   Performed at [facility], [place] [address], [place], [place] [address]   Hgb A1c MFr Bld 05/10/2022 5.8 (H)  4.8 - 5.6 % Final   Comment: (NOTE)         Prediabetes: 5.7 - 6.4         Diabetes: >6.4         Glycemic control for adults with diabetes: <7.0    Mean Plasma Glucose 05/10/2022 120  mg/dL Final   Comment: (NOTE) Performed At:  [facility] [place], [place] [id number] Rush Farmer MD [id number]    Cholesterol 05/10/2022 248 (H)  0 - 200 mg/dL Final   Triglycerides 05/10/2022 69  <150 mg/dL Final   HDL 05/10/2022 110  >40 mg/dL Final   Total CHOL/HDL Ratio 05/10/2022 2.3  RATIO Final   VLDL 05/10/2022 14  0 - 40 mg/dL Final   LDL Cholesterol 05/10/2022 124 (H)  0 - 99 mg/dL Final   Comment:        Total Cholesterol/HDL:CHD Risk Coronary Heart Disease Risk Table                     Men   Women  1/2 Average Risk   3.4   3.3  Average Risk       5.0   4.4  2 X Average Risk   9.6   7.1  3 X Average Risk  23.4   11.0        Use the calculated Patient Ratio above and the CHD Risk Table to determine the patient's CHD Risk.        ATP III CLASSIFICATION (LDL):  <100     mg/dL   Optimal  100-129  mg/dL   Near or Above                    Optimal  130-159  mg/dL   Borderline  160-189  mg/dL   High  >190     mg/dL   Very High Performed at [place] [address] [place],  [address]    TSH 05/10/2022 0.472  0.350 - 4.500 uIU/mL Final   Comment: Performed by a 3rd Generation assay with a functional sensitivity of <=0.01 uIU/mL. Performed at [facility], [place] [address], [place], [place] [address]    POC Amphetamine UR 05/10/2022 None Detected  NONE DETECTED (Cut Off Level 1000 ng/mL) Final   POC Secobarbital (BAR) 05/10/2022 None Detected  NONE DETECTED (Cut Off Level 300 ng/mL) Final   POC Buprenorphine (BUP) 05/10/2022 None Detected  NONE DETECTED (Cut Off Level 10 ng/mL) Final   POC Oxazepam (BZO) 05/10/2022 None Detected  NONE DETECTED (Cut Off Level 300 ng/mL) Final   POC Cocaine UR 05/10/2022 None Detected  NONE DETECTED (Cut Off Level 300 ng/mL) Final   POC Methamphetamine UR 05/10/2022 None Detected  NONE DETECTED (Cut Off Level 1000 ng/mL) Final   POC Morphine 05/10/2022 None Detected  NONE DETECTED (Cut Off Level 300 ng/mL) Final   POC Methadone UR  05/10/2022 None Detected  NONE DETECTED (Cut Off Level 300 ng/mL) Final   POC Oxycodone UR 05/10/2022 None Detected  NONE DETECTED (Cut Off Level 100 ng/mL) Final   POC Marijuana UR 05/10/2022 Positive (A)  NONE DETECTED (Cut Off Level 50 ng/mL) Final   SARSCOV2ONAVIRUS 2 AG 05/10/2022 NEGATIVE  NEGATIVE Final   Comment: (NOTE) SARS-CoV-2 antigen NOT  DETECTED.   Negative results are presumptive.  Negative results do not preclude SARS-CoV-2 infection and should not be used as the sole basis for treatment or other patient management decisions, including infection  control decisions, particularly in the presence of clinical signs and  symptoms consistent with COVID-19, or in those who have been in contact with the virus.  Negative results must be combined with clinical observations, patient history, and epidemiological information. The expected result is Negative.  Fact Sheet for Patients: HandmadeRecipes.com.cy  Fact Sheet for Healthcare Providers: FuneralLife.at  This test is not yet approved or cleared by the [place] FDA and  has been authorized for detection and/or diagnosis of SARS-CoV-2 by FDA under an Emergency Use Authorization (EUA).  This EUA will remain in effect (meaning this test can be used) for the duration of  the COV                          ID-19 declaration under Section 564(b)(1) of the Act, 21 U.S.C. section 360bbb-3(b)(1), unless the authorization is terminated or revoked sooner.    Orders Only on 12/29/2021  Component Date Value Ref Range Status   WBC 12/29/2021 5.8  3.4 - 10.8 x10E3/uL Final   RBC 12/29/2021 4.75  4.14 - 5.80 x10E6/uL Final   Hemoglobin 12/29/2021 15.4  13.0 - 17.7 g/dL Final   Hematocrit 12/29/2021 45.1  37.5 - 51.0 % Final   MCV 12/29/2021 95  79 - 97 fL Final   MCH 12/29/2021 32.4  26.6 - 33.0 pg Final   MCHC 12/29/2021 34.1  31.5 - 35.7 g/dL Final   RDW 12/29/2021 12.9  11.6 - 15.4 %  Final   Platelets 12/29/2021 230  150 - 450 x10E3/uL Final   Neutrophils 12/29/2021 54  Not Estab. % Final   Lymphs 12/29/2021 26  Not Estab. % Final   Monocytes 12/29/2021 16  Not Estab. % Final   Eos 12/29/2021 3  Not Estab. % Final   Basos 12/29/2021 1  Not Estab. % Final   Neutrophils Absolute 12/29/2021 3.1  1.4 - 7.0 x10E3/uL Final   Lymphocytes Absolute 12/29/2021 1.5  0.7 - 3.1 x10E3/uL Final   Monocytes Absolute 12/29/2021 0.9  0.1 - 0.9 x10E3/uL Final   EOS (ABSOLUTE) 12/29/2021 0.2  0.0 - 0.4 x10E3/uL Final   Basophils Absolute 12/29/2021 0.1  0.0 - 0.2 x10E3/uL Final   Immature Granulocytes 12/29/2021 0  Not Estab. % Final   Immature Grans (Abs) 12/29/2021 0.0  0.0 - 0.1 x10E3/uL Final   Glucose 12/29/2021 105 (H)  70 - 99 mg/dL Final   BUN 12/29/2021 7  6 - 20 mg/dL Final   Creatinine, Ser 12/29/2021 0.73 (L)  0.76 - 1.27 mg/dL Final   eGFR 12/29/2021 119  >59 mL/min/1.73 Final   BUN/Creatinine Ratio 12/29/2021 10  9 - 20 Final   Sodium 12/29/2021 139  134 - 144 mmol/L Final   Potassium 12/29/2021 4.0  3.5 - 5.2 mmol/L Final   Chloride 12/29/2021 103  96 - 106 mmol/L Final   CO2 12/29/2021 22  20 - 29 mmol/L Final   Calcium 12/29/2021 9.0  8.7 - 10.2 mg/dL Final   Total Protein 12/29/2021 6.9  6.0 - 8.5 g/dL Final   Albumin 12/29/2021 4.2  4.1 - 5.1 g/dL Final   Globulin, Total 12/29/2021 2.7  1.5 - 4.5 g/dL Final   Albumin/Globulin Ratio 12/29/2021 1.6  1.2 - 2.2 Final   Bilirubin Total 12/29/2021 <  0.2  0.0 - 1.2 mg/dL Final   Alkaline Phosphatase 12/29/2021 74  44 - 121 IU/L Final   AST 12/29/2021 87 (H)  0 - 40 IU/L Final   ALT 12/29/2021 76 (H)  0 - 44 IU/L Final   Cholesterol, Total 12/29/2021 169  100 - 199 mg/dL Final   Triglycerides 12/29/2021 61  0 - 149 mg/dL Final   HDL 12/29/2021 77  >39 mg/dL Final   VLDL Cholesterol Cal 12/29/2021 12  5 - 40 mg/dL Final   LDL Chol Calc (NIH) 12/29/2021 80  0 - 99 mg/dL Final   Chol/HDL Ratio 12/29/2021 2.2  0.0 - 5.0  ratio Final   Comment:                                   T. Chol/HDL Ratio                                             Men  Women                               1/2 Avg.Risk  3.4    3.3                                   Avg.Risk  5.0    4.4                                2X Avg.Risk  9.6    7.1                                3X Avg.Risk 23.4   11.0    Hgb A1c MFr Bld 12/29/2021 5.7 (H)  4.8 - 5.6 % Final   Comment:          Prediabetes: 5.7 - 6.4          Diabetes: >6.4          Glycemic control for adults with diabetes: <7.0    Est. average glucose Bld gHb Est-m* 12/29/2021 117  mg/dL Final   specimen status report 12/29/2021 Comment   Final   Comment: Please note Please note The date and/or time of collection was not indicated on the requisition as required by state and federal law.  The date of receipt of the specimen was used as the collection date if not supplied.     Blood Alcohol level:  Lab Results  Component Value Date   ETH 281 (H) 01/25/2017   ETH (H) 07/29/2008    186        LOWEST DETECTABLE LIMIT FOR SERUM ALCOHOL IS 5 mg/dL FOR MEDICAL PURPOSES ONLY    Metabolic Disorder Labs: Lab Results  Component Value Date   HGBA1C 5.8 (H) 05/10/2022   MPG 120 05/10/2022   No results found for: "PROLACTIN" Lab Results  Component Value Date   CHOL 248 (H) 05/10/2022   TRIG 69 05/10/2022   HDL 110 05/10/2022   CHOLHDL 2.3 05/10/2022   VLDL 14 05/10/2022   LDLCALC 124 (H) 05/10/2022  [place] 80 12/29/2021    Therapeutic Lab Levels: No results found for: "LITHIUM" No results found for: "VALPROATE" No results found for: "CBMZ"  Physical Findings   AIMS    Flowsheet Row Admission (Discharged) from 01/26/2017 in [place] [facility]  AIMS Total Score 0      AUDIT    Flowsheet Row Counselor from 04/01/2021 in [facility] Admission (Discharged) from 01/26/2017 in [place]  [facility]B  Alcohol Use Disorder Identification Test Final Score (AUDIT) 13 9      GAD-7    Flowsheet Row Counselor from 11/10/2021 in [facility] Counselor from 09/23/2021 in [facility] Counselor from 09/01/2021 in [facility] Counselor from 08/04/2021 in [facility] Counselor from 07/07/2021 in [facility]  Total GAD-7 Score '8 5 9 6 18      '$ PHQ2-9    [place] [facility] from 05/10/2022 in [facility] Counselor from 11/10/2021 in [facility] Counselor from 09/23/2021 in [facility] Counselor from 09/01/2021 in [facility] Counselor from 07/07/2021 in [facility]  PHQ-2 Total Score 0 0 0 0 1  PHQ-9 Total Score 0 '3 5 3 3      '$ Flowsheet Row ED from 05/10/2022 in [facility] Counselor from 09/23/2021 in [facility] Counselor from 04/01/2021 in [place] No Risk No Risk No Risk        Musculoskeletal  Strength & Muscle Tone: within normal limits Gait & Station: normal Patient leans: N/A  Psychiatric Specialty Exam  Presentation General Appearance: Appropriate for Environment  Eye Contact:Fair  Speech:Clear and Coherent  Speech Volume:Normal  Handedness:-- (not assessed)   Mood and Affect  Mood:Euthymic  Affect:Congruent   Thought Process  Thought Processes:Coherent; Linear  Descriptions of Associations:Intact  Orientation:Full (Time, Place and Person)  Thought Content:Logical    Hallucinations:Hallucinations: None  Ideas of Reference:None  Suicidal Thoughts:Suicidal Thoughts: No  Homicidal Thoughts:Homicidal Thoughts: No   Sensorium  Memory:Immediate Fair; Recent Fair; Remote  Fair  Judgment:Fair  Insight:Fair   Executive Functions  Concentration:Fair  Attention Span:Fair  [place]   Psychomotor Activity  Psychomotor Activity:Psychomotor Activity: Normal   Assets  Assets:Communication Skills; Resilience   Sleep  Sleep:Sleep: Fair   Nutritional Assessment (For OBS and FBC admissions only) Has the patient had a weight loss or gain of 10 pounds or more in the last 3 months?: No Has the patient had a decrease in food intake/or appetite?: Yes Does the patient have dental problems?: No Does the patient have eating habits or behaviors that may be indicators of an eating disorder including binging or inducing vomiting?: No Has the patient recently lost weight without trying?: 0 Has the patient been eating poorly because of a decreased appetite?: 0 Malnutrition Screening Tool Score: 0    Physical Exam Constitutional:      Appearance: the patient is not toxic-appearing.  Pulmonary:     Effort: Pulmonary effort is normal.  Neurological:     General: No focal deficit present.     Mental Status: the patient is alert and oriented to person, place, and time.   Review of Systems  Respiratory:  Negative for shortness of breath.   Cardiovascular:  Negative for chest  pain.  Gastrointestinal:  Negative for abdominal pain, constipation, diarrhea, nausea and vomiting.  Neurological:  Negative for headaches.    BP (!) 153/99   Pulse 79   Temp 98.3 F (36.8 C) (Oral)   Resp 20   SpO2 98%   Assessment and Plan:  Status: Voluntary, may be discharged AMA should the patient request, given lack of documented self-harm behavior and no apparent suicidal thoughts   Alcohol use disorder, history of moderate withdrawal symptoms - Ativan taper with CIWA and as needed Ativan   Restart home medications, different dosages been as documented in the medical record, given the patient's lack of adherence -  Propranolol 20 mg twice daily - Seroquel 50 mg nightly - Paxil 40 mg daily - Lisinopril 20 mg daily   Medical: Adding regular propranolol and lisinopril should help with hypertension and tachycardia.  The patient's hypertension is consistent with the patient's vitals at outpatient clinic. EKG to rule out other causes of tachycardia Routine labs    Corky Sox, MD 05/11/2022 12:22 PM

## 2022-05-11 NOTE — ED Notes (Signed)
Pt laying on bed reading a book in no acute distress. Denies needs or concerns at present. Environment secured. Will continue to monitor for safety.

## 2022-05-11 NOTE — ED Notes (Signed)
Pt sleeping in no acute distress. RR even and unlabored. Environment secured. Will continue to monitor for safety. 

## 2022-05-11 NOTE — ED Notes (Signed)
Pt is in the bed sleeping. Respirations are even and unlabored. No acute distress noted. Will continue to monitor for safety. 

## 2022-05-11 NOTE — ED Notes (Signed)
Pt sitting in dayroom interacting with peers. No acute distress noted. No concerns voiced. Informed pt to notify staff with any needs or assistance. Pt verbalized understanding or agreement. Will continue to monitor for safety.

## 2022-05-11 NOTE — ED Notes (Signed)
Pt sitting in dayroom watching tv. No acute distress noted. No concerns voiced. Informed pt to notify staff with any needs or assistance. Pt verbalized understanding or agreement. Will continue to monitor for safety.

## 2022-05-11 NOTE — ED Notes (Signed)
Patient A&Ox4. Denies intent to harm self/others when asked. Denies A/VH. Patient denies any physical complaints or withdrawal sx when asked. No acute distress noted. Pt reports sleeping "pretty good" last night. Routine safety checks conducted according to facility protocol. Encouraged patient to notify staff if thoughts of harm toward self or others arise. Patient verbalize understanding and agreement. Will continue to monitor for safety.

## 2022-05-11 NOTE — ED Notes (Signed)
Patient observed/assessed in dayroom watching tv and conversing with other patients. Patient alert and oriented x 4. Affect is bright.  Patient denies pain and anxiety. He denies A/V/H. He denies having any thoughts/plan of self harm and harm towards others. Fluid and snack offered. Patient states that appetite has been good throughout the day. Last BM was today 05/11/22. Verbalizes no further complaints at this time. Will continue to monitor and support.

## 2022-05-12 DIAGNOSIS — F411 Generalized anxiety disorder: Secondary | ICD-10-CM | POA: Diagnosis not present

## 2022-05-12 DIAGNOSIS — F17210 Nicotine dependence, cigarettes, uncomplicated: Secondary | ICD-10-CM | POA: Diagnosis not present

## 2022-05-12 DIAGNOSIS — R000 Tachycardia, unspecified: Secondary | ICD-10-CM | POA: Diagnosis not present

## 2022-05-12 DIAGNOSIS — F319 Bipolar disorder, unspecified: Secondary | ICD-10-CM | POA: Diagnosis not present

## 2022-05-12 DIAGNOSIS — I10 Essential (primary) hypertension: Secondary | ICD-10-CM | POA: Diagnosis not present

## 2022-05-12 DIAGNOSIS — Z1152 Encounter for screening for COVID-19: Secondary | ICD-10-CM | POA: Diagnosis not present

## 2022-05-12 DIAGNOSIS — F429 Obsessive-compulsive disorder, unspecified: Secondary | ICD-10-CM | POA: Diagnosis not present

## 2022-05-12 DIAGNOSIS — F4310 Post-traumatic stress disorder, unspecified: Secondary | ICD-10-CM | POA: Diagnosis not present

## 2022-05-12 DIAGNOSIS — F1090 Alcohol use, unspecified, uncomplicated: Secondary | ICD-10-CM | POA: Diagnosis not present

## 2022-05-12 NOTE — ED Notes (Signed)
Patient observed/assessed at nursing station. Patient alert and oriented x 4. Affect is flat. Patient denies pain and anxiety. He denies A/V/H. He denies having any thoughts/plan of self harm and harm towards others. Fluid and snack offered. Patient states that appetite has been good throughout the day.  Verbalizes no further complaints at this time. Will continue to monitor and support.

## 2022-05-12 NOTE — Group Note (Signed)
Group Topic: Healthy Self Image and Positive Change  Group Date: 05/12/2022 Start Time: 1030 End Time: 1115 Facilitators: Jennye Moccasin  Department: [facility]  Number of Participants: 6  Group Focus: self-awareness Treatment Modality:  Psychoeducation Interventions utilized were group exercise Purpose: increase insight  Name: [name] Date of Birth: 07/30/1980  MR: [id number]    Level of Participation: moderate Quality of Participation: attentive and cooperative Interactions with others: gave feedback Mood/Affect: positive Triggers (if applicable): na Cognition: coherent/clear Progress: Moderate Response: na Plan: follow-up needed  Patients Problems:  Patient Active Problem List   Diagnosis Date Noted   Alcohol use disorder 05/10/2022   GAD (generalized anxiety disorder) 07/07/2021   Agoraphobia 07/07/2021   Bipolar 1 disorder, mixed, moderate ([place]) 05/13/2021   Substance induced mood disorder ([place]) 04/01/2021   PTSD (post-traumatic stress disorder) 04/01/2021   Alcohol abuse with alcohol-induced mood disorder ([place]) 01/26/2017   Intermittent explosive disorder 01/26/2017

## 2022-05-12 NOTE — ED Provider Notes (Signed)
Behavioral Health Progress Note  Date and Time: 05/12/2022 1:59 PM Name: [name] MRN:  [id number]  Subjective:   On assessment 3/6, the patient denies experiencing withdrawal symptoms except for occasional "crankiness".  Discussed work by [employer] to find residential rehab placement.  The patient denies auditory/visual hallucinations.  The patient reports good mood, appetite, and sleep. They deny suicidal and homicidal thoughts. The patient denies side effects from their medications.  Review of systems as below. The patient denies experiencing any withdrawal symptoms.    Information obtained on initial evaluation: The patient is a 42 year old male with with a history of alcohol use disorder, GAD, OCD, and PTSD, diagnoses made by his outpatient psychiatric provider.  The patient has a history of psychiatric admission in 2018, in which his girlfriend called the police because she was worried that he was suicidal.  No documented previous self-harm behavior or suicide attempts.   The patient presented on 3/4 with his mother for assessment at the behavioral health urgent care.  He was referred for consideration of facility based crisis admission by his outpatient psychiatric provider.  The patient reports that alcohol has caused significant problems for him throughout his life.  He reports a DUI 6 years ago, issues with anger when intoxicated, problems showing up to work, and interpersonal dysfunction with romantic partners and his family.  He states that he desires to quit and says "I am a slave to it".  He reports that he normally experiences some alcohol withdrawal symptoms such as "cranky and shaky".  He denies experiencing seizures or delirium tremens.  He reports that he drinks approximately 15 to 1812 ounce beers per day.  He states that he has never done detox before.  He is uncertain what he wants after detox, but he says he may be interested in residential rehab.  His mother states that she would  like for [place] to investigate self-pay options in addition to [facility] residential.   The patient denies experiencing recent depression or suicidal thoughts.  He reports smoking cigarettes and some marijuana recently.  He denies use of opioids.   Social history is notable for the patient being born and raised in [place] by his mother and father, the patient completing high school and some trade school, and working as a Dealer for some period of time.  The patient has been unemployed for several months and has been sitting around the house for most of the day.  He reports that he has his own house but that his parents pay for all that he has.  He reports no previous marriage or romantic partners longer than 3 years.    Diagnosis:  Final diagnoses:  Alcohol use disorder   Total Time spent with patient: 20 minutes  Past Psychiatric History: as above Past Medical History: as above Family History: none Family Psychiatric  History: none Social History: as above and per H and P  Additional Social History:  See H and P                  Sleep: Fair  Appetite:  Fair   Current Medications:  Current Facility-Administered Medications  Medication Dose Route Frequency Provider Last Rate Last Admin   acetaminophen (TYLENOL) tablet 650 mg  650 mg Oral Q6H PRN Corky Sox, MD       alum & mag hydroxide-simeth (MAALOX/MYLANTA) 200-200-20 MG/5ML suspension 30 mL  30 mL Oral Q4H PRN Corky Sox, MD       hydrOXYzine (ATARAX) tablet  25 mg  25 mg Oral TID PRN Corky Sox, MD       lisinopril (ZESTRIL) tablet 20 mg  20 mg Oral Daily Corky Sox, MD   20 mg at 05/12/22 [id number]   loperamide (IMODIUM) capsule 2-4 mg  2-4 mg Oral PRN Corky Sox, MD       LORazepam (ATIVAN) tablet 1 mg  1 mg Oral Q6H PRN Corky Sox, MD       LORazepam (ATIVAN) tablet 1 mg  1 mg Oral TID Corky Sox, MD   1 mg at 05/12/22 [id number]   Followed by   Derrill Memo ON 05/13/2022] LORazepam (ATIVAN) tablet  1 mg  1 mg Oral BID Corky Sox, MD       Followed by   Derrill Memo ON 05/14/2022] LORazepam (ATIVAN) tablet 1 mg  1 mg Oral Daily Corky Sox, MD       magnesium hydroxide (MILK OF MAGNESIA) suspension 30 mL  30 mL Oral Daily PRN Corky Sox, MD       multivitamin with minerals tablet 1 tablet  1 tablet Oral Daily Corky Sox, MD   1 tablet at 05/12/22 0917   nicotine (NICODERM CQ - dosed in mg/24 hours) patch 21 mg  21 mg Transdermal Q0600 Corky Sox, MD   21 mg at 05/12/22 [id number]   nicotine polacrilex (NICORETTE) gum 2 mg  2 mg Oral PRN Corky Sox, MD       ondansetron (ZOFRAN-ODT) disintegrating tablet 4 mg  4 mg Oral Q6H PRN Corky Sox, MD       PARoxetine (PAXIL) tablet 40 mg  40 mg Oral Daily Corky Sox, MD   40 mg at 05/12/22 [id number]   propranolol (INDERAL) tablet 20 mg  20 mg Oral BID Corky Sox, MD   20 mg at 05/12/22 0917   QUEtiapine (SEROQUEL) tablet 50 mg  50 mg Oral QHS Corky Sox, MD   50 mg at 05/11/22 2106   thiamine (VITAMIN B1) tablet 100 mg  100 mg Oral Daily Corky Sox, MD   100 mg at 05/12/22 [id number]   Current Outpatient Medications  Medication Sig Dispense Refill   lisinopril (ZESTRIL) 20 MG tablet Take 1 tablet (20 mg total) by mouth daily. 30 tablet 1   Multiple Vitamins-Minerals (ADULT GUMMY PO) Take 1 tablet by mouth daily.     PARoxetine (PAXIL) 40 MG tablet Take 1 tablet (40 mg total) by mouth daily. 30 tablet 1   propranolol (INDERAL) 40 MG tablet Take 1 tablet (40 mg total) by mouth 2 (two) times daily. 60 tablet 1   QUEtiapine (SEROQUEL) 200 MG tablet Take 1 tablet (200 mg total) by mouth at bedtime. 30 tablet 1    Labs  Lab Results:  Admission on 05/10/2022  Component Date Value Ref Range Status   SARS Coronavirus 2 by RT PCR 05/10/2022 NEGATIVE  NEGATIVE Final   Influenza A by PCR 05/10/2022 NEGATIVE  NEGATIVE Final   Influenza B by PCR 05/10/2022 NEGATIVE  NEGATIVE Final   Comment: (NOTE) The Xpert Xpress  SARS-CoV-2/FLU/RSV plus assay is intended as an aid in the diagnosis of influenza from Nasopharyngeal swab specimens and should not be used as a sole basis for treatment. Nasal washings and aspirates are unacceptable for Xpert Xpress SARS-CoV-2/FLU/RSV testing.  Fact Sheet for Patients: EntrepreneurPulse.com.au  Fact Sheet for Healthcare Providers: IncredibleEmployment.be  This test is not yet approved or cleared by the [place] FDA and has been authorized for detection and/or diagnosis of SARS-CoV-2 by FDA under  an Emergency Use Authorization (EUA). This EUA will remain in effect (meaning this test can be used) for the duration of the COVID-19 declaration under Section 564(b)(1) of the Act, 21 U.S.C. section 360bbb-3(b)(1), unless the authorization is terminated or revoked.     Resp Syncytial Virus by PCR 05/10/2022 NEGATIVE  NEGATIVE Final   Comment: (NOTE) Fact Sheet for Patients: EntrepreneurPulse.com.au  Fact Sheet for Healthcare Providers: IncredibleEmployment.be  This test is not yet approved or cleared by the [place] FDA and has been authorized for detection and/or diagnosis of SARS-CoV-2 by FDA under an Emergency Use Authorization (EUA). This EUA will remain in effect (meaning this test can be used) for the duration of the COVID-19 declaration under Section 564(b)(1) of the Act, 21 U.S.C. section 360bbb-3(b)(1), unless the authorization is terminated or revoked.  Performed at [facility], [place] [address] [place], [place] [address]    WBC 05/10/2022 6.5  4.0 - 10.5 K/uL Final   RBC 05/10/2022 4.75  4.22 - 5.81 MIL/uL Final   Hemoglobin 05/10/2022 15.9  13.0 - 17.0 g/dL Final   HCT 05/10/2022 44.5  39.0 - 52.0 % Final   MCV 05/10/2022 93.7  80.0 - 100.0 fL Final   MCH 05/10/2022 33.5  26.0 - 34.0 pg Final   MCHC 05/10/2022 35.7  30.0 - 36.0 g/dL Final   RDW 05/10/2022 12.8   11.5 - 15.5 % Final   Platelets 05/10/2022 245  150 - 400 K/uL Final   nRBC 05/10/2022 0.0  0.0 - 0.2 % Final   Neutrophils Relative % 05/10/2022 51  % Final   Neutro Abs 05/10/2022 3.3  1.7 - 7.7 K/uL Final   Lymphocytes Relative 05/10/2022 33  % Final   Lymphs Abs 05/10/2022 2.2  0.7 - 4.0 K/uL Final   Monocytes Relative 05/10/2022 12  % Final   Monocytes Absolute 05/10/2022 0.8  0.1 - 1.0 K/uL Final   Eosinophils Relative 05/10/2022 2  % Final   Eosinophils Absolute 05/10/2022 0.1  0.0 - 0.5 K/uL Final   Basophils Relative 05/10/2022 1  % Final   Basophils Absolute 05/10/2022 0.1  0.0 - 0.1 K/uL Final   Immature Granulocytes 05/10/2022 1  % Final   Abs Immature Granulocytes 05/10/2022 0.04  0.00 - 0.07 K/uL Final   Performed at [facility], [place] [address] [place], [place] [address]   Sodium 05/10/2022 137  135 - 145 mmol/L Final   Potassium 05/10/2022 3.7  3.5 - 5.1 mmol/L Final   Chloride 05/10/2022 102  98 - 111 mmol/L Final   CO2 05/10/2022 21 (L)  22 - 32 mmol/L Final   Glucose, Bld 05/10/2022 100 (H)  70 - 99 mg/dL Final   Glucose reference range applies only to samples taken after fasting for at least 8 hours.   BUN 05/10/2022 5 (L)  6 - 20 mg/dL Final   Creatinine, Ser 05/10/2022 0.67  0.61 - 1.24 mg/dL Final   Calcium 05/10/2022 9.0  8.9 - 10.3 mg/dL Final   Total Protein 05/10/2022 8.3 (H)  6.5 - 8.1 g/dL Final   Albumin 05/10/2022 4.2  3.5 - 5.0 g/dL Final   AST 05/10/2022 143 (H)  15 - 41 U/L Final   ALT 05/10/2022 163 (H)  0 - 44 U/L Final   Alkaline Phosphatase 05/10/2022 61  38 - 126 U/L Final   Total Bilirubin 05/10/2022 0.6  0.3 - 1.2 mg/dL Final   GFR, Estimated 05/10/2022 >60  >60 mL/min Final  Comment: (NOTE) Calculated using the CKD-EPI Creatinine Equation (2021)    Anion gap 05/10/2022 14  5 - 15 Final   Performed at [facility], [place] [address], [place], [place] [address]   Hgb A1c MFr Bld 05/10/2022 5.8 (H)  4.8 - 5.6 % Final   Comment:  (NOTE)         Prediabetes: 5.7 - 6.4         Diabetes: >6.4         Glycemic control for adults with diabetes: <7.0    Mean Plasma Glucose 05/10/2022 120  mg/dL Final   Comment: (NOTE) Performed At: [facility] [place], [place] [id number] Rush Farmer MD [id number]    Cholesterol 05/10/2022 248 (H)  0 - 200 mg/dL Final   Triglycerides 05/10/2022 69  <150 mg/dL Final   HDL 05/10/2022 110  >40 mg/dL Final   Total CHOL/HDL Ratio 05/10/2022 2.3  RATIO Final   VLDL 05/10/2022 14  0 - 40 mg/dL Final   LDL Cholesterol 05/10/2022 124 (H)  0 - 99 mg/dL Final   Comment:        Total Cholesterol/HDL:CHD Risk Coronary Heart Disease Risk Table                     Men   Women  1/2 Average Risk   3.4   3.3  Average Risk       5.0   4.4  2 X Average Risk   9.6   7.1  3 X Average Risk  23.4   11.0        Use the calculated Patient Ratio above and the CHD Risk Table to determine the patient's CHD Risk.        ATP III CLASSIFICATION (LDL):  <100     mg/dL   Optimal  100-129  mg/dL   Near or Above                    Optimal  130-159  mg/dL   Borderline  160-189  mg/dL   High  >190     mg/dL   Very High Performed at [facility] [address], [place], [place] [address]    TSH 05/10/2022 0.472  0.350 - 4.500 uIU/mL Final   Comment: Performed by a 3rd Generation assay with a functional sensitivity of <=0.01 uIU/mL. Performed at [facility], [place] [address], [place], [place] [address]    POC Amphetamine UR 05/10/2022 None Detected  NONE DETECTED (Cut Off Level 1000 ng/mL) Final   POC Secobarbital (BAR) 05/10/2022 None Detected  NONE DETECTED (Cut Off Level 300 ng/mL) Final   POC Buprenorphine (BUP) 05/10/2022 None Detected  NONE DETECTED (Cut Off Level 10 ng/mL) Final   POC Oxazepam (BZO) 05/10/2022 None Detected  NONE DETECTED (Cut Off Level 300 ng/mL) Final   POC Cocaine UR 05/10/2022 None Detected  NONE DETECTED (Cut Off Level 300 ng/mL) Final    POC Methamphetamine UR 05/10/2022 None Detected  NONE DETECTED (Cut Off Level 1000 ng/mL) Final   POC Morphine 05/10/2022 None Detected  NONE DETECTED (Cut Off Level 300 ng/mL) Final   POC Methadone UR 05/10/2022 None Detected  NONE DETECTED (Cut Off Level 300 ng/mL) Final   POC Oxycodone UR 05/10/2022 None Detected  NONE DETECTED (Cut Off Level 100 ng/mL) Final   POC Marijuana UR 05/10/2022 Positive (A)  NONE DETECTED (Cut Off Level 50 ng/mL) Final  SARSCOV2ONAVIRUS 2 AG 05/10/2022 NEGATIVE  NEGATIVE Final   Comment: (NOTE) SARS-CoV-2 antigen NOT DETECTED.   Negative results are presumptive.  Negative results do not preclude SARS-CoV-2 infection and should not be used as the sole basis for treatment or other patient management decisions, including infection  control decisions, particularly in the presence of clinical signs and  symptoms consistent with COVID-19, or in those who have been in contact with the virus.  Negative results must be combined with clinical observations, patient history, and epidemiological information. The expected result is Negative.  Fact Sheet for Patients: HandmadeRecipes.com.cy  Fact Sheet for Healthcare Providers: FuneralLife.at  This test is not yet approved or cleared by the [place] FDA and  has been authorized for detection and/or diagnosis of SARS-CoV-2 by FDA under an Emergency Use Authorization (EUA).  This EUA will remain in effect (meaning this test can be used) for the duration of  the COV                          ID-19 declaration under Section 564(b)(1) of the Act, 21 U.S.C. section 360bbb-3(b)(1), unless the authorization is terminated or revoked sooner.    Orders Only on 12/29/2021  Component Date Value Ref Range Status   WBC 12/29/2021 5.8  3.4 - 10.8 x10E3/uL Final   RBC 12/29/2021 4.75  4.14 - 5.80 x10E6/uL Final   Hemoglobin 12/29/2021 15.4  13.0 - 17.7 g/dL Final   Hematocrit  12/29/2021 45.1  37.5 - 51.0 % Final   MCV 12/29/2021 95  79 - 97 fL Final   MCH 12/29/2021 32.4  26.6 - 33.0 pg Final   MCHC 12/29/2021 34.1  31.5 - 35.7 g/dL Final   RDW 12/29/2021 12.9  11.6 - 15.4 % Final   Platelets 12/29/2021 230  150 - 450 x10E3/uL Final   Neutrophils 12/29/2021 54  Not Estab. % Final   Lymphs 12/29/2021 26  Not Estab. % Final   Monocytes 12/29/2021 16  Not Estab. % Final   Eos 12/29/2021 3  Not Estab. % Final   Basos 12/29/2021 1  Not Estab. % Final   Neutrophils Absolute 12/29/2021 3.1  1.4 - 7.0 x10E3/uL Final   Lymphocytes Absolute 12/29/2021 1.5  0.7 - 3.1 x10E3/uL Final   Monocytes Absolute 12/29/2021 0.9  0.1 - 0.9 x10E3/uL Final   EOS (ABSOLUTE) 12/29/2021 0.2  0.0 - 0.4 x10E3/uL Final   Basophils Absolute 12/29/2021 0.1  0.0 - 0.2 x10E3/uL Final   Immature Granulocytes 12/29/2021 0  Not Estab. % Final   Immature Grans (Abs) 12/29/2021 0.0  0.0 - 0.1 x10E3/uL Final   Glucose 12/29/2021 105 (H)  70 - 99 mg/dL Final   BUN 12/29/2021 7  6 - 20 mg/dL Final   Creatinine, Ser 12/29/2021 0.73 (L)  0.76 - 1.27 mg/dL Final   eGFR 12/29/2021 119  >59 mL/min/1.73 Final   BUN/Creatinine Ratio 12/29/2021 10  9 - 20 Final   Sodium 12/29/2021 139  134 - 144 mmol/L Final   Potassium 12/29/2021 4.0  3.5 - 5.2 mmol/L Final   Chloride 12/29/2021 103  96 - 106 mmol/L Final   CO2 12/29/2021 22  20 - 29 mmol/L Final   Calcium 12/29/2021 9.0  8.7 - 10.2 mg/dL Final   Total Protein 12/29/2021 6.9  6.0 - 8.5 g/dL Final   Albumin 12/29/2021 4.2  4.1 - 5.1 g/dL Final   Globulin, Total 12/29/2021 2.7  1.5 - 4.5 g/dL Final  Albumin/Globulin Ratio 12/29/2021 1.6  1.2 - 2.2 Final   Bilirubin Total 12/29/2021 <0.2  0.0 - 1.2 mg/dL Final   Alkaline Phosphatase 12/29/2021 74  44 - 121 IU/L Final   AST 12/29/2021 87 (H)  0 - 40 IU/L Final   ALT 12/29/2021 76 (H)  0 - 44 IU/L Final   Cholesterol, Total 12/29/2021 169  100 - 199 mg/dL Final   Triglycerides 12/29/2021 61  0 - 149  mg/dL Final   HDL 12/29/2021 77  >39 mg/dL Final   VLDL Cholesterol Cal 12/29/2021 12  5 - 40 mg/dL Final   LDL Chol Calc (NIH) 12/29/2021 80  0 - 99 mg/dL Final   Chol/HDL Ratio 12/29/2021 2.2  0.0 - 5.0 ratio Final   Comment:                                   T. Chol/HDL Ratio                                             Men  Women                               1/2 Avg.Risk  3.4    3.3                                   Avg.Risk  5.0    4.4                                2X Avg.Risk  9.6    7.1                                3X Avg.Risk 23.4   11.0    Hgb A1c MFr Bld 12/29/2021 5.7 (H)  4.8 - 5.6 % Final   Comment:          Prediabetes: 5.7 - 6.4          Diabetes: >6.4          Glycemic control for adults with diabetes: <7.0    Est. average glucose Bld gHb Est-m* 12/29/2021 117  mg/dL Final   specimen status report 12/29/2021 Comment   Final   Comment: Please note Please note The date and/or time of collection was not indicated on the requisition as required by state and federal law.  The date of receipt of the specimen was used as the collection date if not supplied.     Blood Alcohol level:  Lab Results  Component Value Date   ETH 281 (H) 01/25/2017   ETH (H) 07/29/2008    186        LOWEST DETECTABLE LIMIT FOR SERUM ALCOHOL IS 5 mg/dL FOR MEDICAL PURPOSES ONLY    Metabolic Disorder Labs: Lab Results  Component Value Date   HGBA1C 5.8 (H) 05/10/2022   MPG 120 05/10/2022   No results found for: "PROLACTIN" Lab Results  Component Value Date   CHOL 248 (H) 05/10/2022   TRIG 69 05/10/2022   HDL 110 05/10/2022  CHOLHDL 2.3 05/10/2022   VLDL 14 05/10/2022   LDLCALC 124 (H) 05/10/2022   LDLCALC 80 12/29/2021    Therapeutic Lab Levels: No results found for: "LITHIUM" No results found for: "VALPROATE" No results found for: "CBMZ"  Physical Findings   AIMS    Flowsheet Row Admission (Discharged) from 01/26/2017 in [facility]  300B  AIMS Total Score 0      AUDIT    Flowsheet Row Counselor from 04/01/2021 in [facility] Admission (Discharged) from 01/26/2017 in [facility] 300B  Alcohol Use Disorder Identification Test Final Score (AUDIT) 13 9      GAD-7    Flowsheet Row Counselor from 11/10/2021 in [facility] Counselor from 09/23/2021 in [facility] Counselor from 09/01/2021 in [facility] Counselor from 08/04/2021 in [facility] Counselor from 07/07/2021 in [facility]  Total GAD-7 Score '8 5 9 6 18      '$ PHQ2-9    [facility] from 05/10/2022 in [facility] Counselor from 11/10/2021 in [facility] Counselor from 09/23/2021 in [facility] Counselor from 09/01/2021 in [facility] Counselor from 07/07/2021 in [facility]  PHQ-2 Total Score 0 0 0 0 1  PHQ-9 Total Score 0 '3 5 3 3      '$ Flowsheet Row ED from 05/10/2022 in [facility] Counselor from 09/23/2021 in [facility] Counselor from 04/01/2021 in [place] No Risk No Risk No Risk        Musculoskeletal  Strength & Muscle Tone: within normal limits Gait & Station: normal Patient leans: N/A  Psychiatric Specialty Exam  Presentation General Appearance: Appropriate for Environment  Eye Contact:Fair  Speech:Clear and Coherent  Speech Volume:Normal  Handedness:-- (not assessed)   Mood and Affect  Mood:Euthymic  Affect:Congruent   Thought Process  Thought Processes:Coherent; Linear  Descriptions of Associations:Intact  Orientation:Full (Time, Place and Person)  Thought Content:Logical     Hallucinations:Hallucinations: None  Ideas of Reference:None  Suicidal Thoughts:Suicidal Thoughts: No  Homicidal Thoughts:Homicidal Thoughts: No   Sensorium  Memory:Immediate Fair; Recent Fair; Remote Fair  Judgment:Fair  Insight:Fair   Executive Functions  Concentration:Fair  Attention Span:Fair  [place]   Psychomotor Activity  Psychomotor Activity:Psychomotor Activity: Normal   Assets  Assets:Communication Skills; Resilience   Sleep  Sleep:Sleep: Fair   Nutritional Assessment (For OBS and FBC admissions only) Has the patient had a weight loss or gain of 10 pounds or more in the last 3 months?: No Has the patient had a decrease in food intake/or appetite?: Yes Does the patient have dental problems?: No Does the patient have eating habits or behaviors that may be indicators of an eating disorder including binging or inducing vomiting?: No Has the patient recently lost weight without trying?: 0 Has the patient been eating poorly because of a decreased appetite?: 0 Malnutrition Screening Tool Score: 0    Physical Exam Constitutional:      Appearance: the patient is not toxic-appearing.  Pulmonary:     Effort: Pulmonary effort is normal.  Neurological:     General: No focal deficit present.     Mental Status: the patient is alert and oriented to person, place, and time.   Review of  Systems  Respiratory:  Negative for shortness of breath.   Cardiovascular:  Negative for chest pain.  Gastrointestinal:  Negative for abdominal pain, constipation, diarrhea, nausea and vomiting.  Neurological:  Negative for headaches.    BP (!) 150/103   Pulse 71   Temp 99.6 F (37.6 C) (Oral)   Resp 18   SpO2 98%   Assessment and Plan:  Status: Voluntary, may be discharged AMA should the patient request, given lack of documented self-harm behavior and no apparent suicidal thoughts   Alcohol use disorder, history of  moderate withdrawal symptoms - Ativan taper with CIWA and as needed Ativan   Restart home medications, different dosages been as documented in the medical record, given the patient's lack of adherence - Propranolol 20 mg twice daily - Seroquel 50 mg nightly - Paxil 40 mg daily - Lisinopril 20 mg daily   Medical: Adding regular propranolol and lisinopril should help with hypertension and tachycardia.  The patient's hypertension is consistent with the patient's vitals at outpatient clinic. Routine labs: Notable for slight elevation in LFTs, UDS positive for marijuana    Corky Sox, MD 05/12/2022 1:59 PM

## 2022-05-12 NOTE — ED Notes (Signed)
[name] engaged in wrap up group and talked about goals.

## 2022-05-12 NOTE — ED Notes (Signed)
Patient has been calm, quiet and pleasant on approach.  He has been visible on unit and attended all groups offered today.  Patient appears to be motivated for treatment.  He has coloring pens dropped off by family and they were approved for his use by Benjamin Stain.  Patient has been sitting in dayroom coloring, watching tv and socializing.  No evidence of withdrawal.  Encouraged to make needs known.  Will monitor and provide support as needed.

## 2022-05-12 NOTE — ED Notes (Signed)
Patient awake and alert on unit sitting in dayroom eating breakfast.  Patient without distress or complaint.  Will monitor and provide support s needed.

## 2022-05-12 NOTE — ED Notes (Signed)
Patient observed/assessed in room in bed appearing in no immediate distress resting peacefully. Q15 minute checks continued by MHT and nursing staff. Will continue to monitor and support. 

## 2022-05-12 NOTE — Tx Team (Signed)
LCSW met with patient to assess current mood, affect, physical state, and inquire about needs/goals while here in [facility] and after discharge. Patient reports he presented due to "needing to get clean". Patient reports he lives at home alone and reports social support from parents. Patient reports he has been drinking alcohol since he was 16. Patient reports an increase an his usage over the last 5-7 years. Patient reports he drinks on average 15-20 beers per day. Patient reports a couple of days of sobriety, however reports he typically drinks from 7am until he no longer has access. Patient reports he occasionally smokes marijuana, however does not deem this an issue at this time. Patient reports smoking a pack of cigarettes per day. Patient reports he has never tried to seek inpatient help for his alcohol use. Patient reports he is currently receiving outpatient therapy and medication management from the [facility] and reports he has been receiving these services for the last 6-7 months. Patient reports his current goal is to seek residential placement for substance use. Patient denies having any pending charges or upcoming court dates. 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 Patient reports he is only interested in facilities in [place]. Patient explored if he could be referred to [facility], as his father was there in the past and would like for him to receive the same treatment. Patient aware that LCSW will send referrals out for review to [facility] and [facility] and will follow up to provide updates as received. Patient expressed understanding and appreciation of LCSW assistance. No other needs were reported at this time by patient.   Lucius Conn, LCSW Clinical Social Worker [facility] Ph: [phone]

## 2022-05-13 DIAGNOSIS — I10 Essential (primary) hypertension: Secondary | ICD-10-CM | POA: Diagnosis not present

## 2022-05-13 DIAGNOSIS — F411 Generalized anxiety disorder: Secondary | ICD-10-CM | POA: Diagnosis not present

## 2022-05-13 DIAGNOSIS — Z1152 Encounter for screening for COVID-19: Secondary | ICD-10-CM | POA: Diagnosis not present

## 2022-05-13 DIAGNOSIS — F429 Obsessive-compulsive disorder, unspecified: Secondary | ICD-10-CM | POA: Diagnosis not present

## 2022-05-13 DIAGNOSIS — F4310 Post-traumatic stress disorder, unspecified: Secondary | ICD-10-CM | POA: Diagnosis not present

## 2022-05-13 DIAGNOSIS — F1090 Alcohol use, unspecified, uncomplicated: Secondary | ICD-10-CM | POA: Diagnosis not present

## 2022-05-13 DIAGNOSIS — R000 Tachycardia, unspecified: Secondary | ICD-10-CM | POA: Diagnosis not present

## 2022-05-13 DIAGNOSIS — F17210 Nicotine dependence, cigarettes, uncomplicated: Secondary | ICD-10-CM | POA: Diagnosis not present

## 2022-05-13 DIAGNOSIS — F319 Bipolar disorder, unspecified: Secondary | ICD-10-CM | POA: Diagnosis not present

## 2022-05-13 LAB — COMPREHENSIVE METABOLIC PANEL
ALT: 145 U/L — ABNORMAL HIGH (ref 0–44)
AST: 119 U/L — ABNORMAL HIGH (ref 15–41)
Albumin: 3.9 g/dL (ref 3.5–5.0)
Alkaline Phosphatase: 59 U/L (ref 38–126)
Anion gap: 11 (ref 5–15)
BUN: 16 mg/dL (ref 6–20)
CO2: 23 mmol/L (ref 22–32)
Calcium: 9.9 mg/dL (ref 8.9–10.3)
Chloride: 100 mmol/L (ref 98–111)
Creatinine, Ser: 0.94 mg/dL (ref 0.61–1.24)
GFR, Estimated: >60 mL/min (ref >60)
Glucose, Bld: 105 mg/dL — ABNORMAL HIGH (ref 70–99)
Potassium: 5.2 mmol/L — ABNORMAL HIGH (ref 3.5–5.1)
Sodium: 134 mmol/L — ABNORMAL LOW (ref 135–145)
Total Bilirubin: 0.6 mg/dL (ref 0.3–1.2)
Total Protein: 7.3 g/dL (ref 6.5–8.1)

## 2022-05-13 NOTE — ED Notes (Signed)
Patient is sleeping. Respirations equal and unlabored, skin warm and dry, NAD. No change in assessment or acuity. Routine safety checks conducted according to facility protocol. Will continue to monitor for safety.

## 2022-05-13 NOTE — ED Notes (Signed)
Pt sleeping@this time. Breathing even and unlabored. Will continue to monitor for safety 

## 2022-05-13 NOTE — ED Provider Notes (Signed)
Behavioral Health Progress Note  Date and Time: 05/13/2022 1:35 PM Name: [name] MRN:  [id number]  Subjective:   On assessment 3/7, the patient reports that he feels nauseous and has a leg cramp.  Discussed plan for additional dose of Ativan as well as Zofran.  Will assess response, some concern for alcohol withdrawal.  LCSW reports waiting on follow-up from [facility] in [facility].  On assessment 3/6, the patient denies experiencing withdrawal symptoms except for occasional "crankiness".  Discussed work by [employer] to find residential rehab placement.  The patient denies auditory/visual hallucinations.  The patient reports good mood, appetite, and sleep. They deny suicidal and homicidal thoughts. The patient denies side effects from their medications.  Review of systems as below. The patient denies experiencing any withdrawal symptoms.    Information obtained on initial evaluation: The patient is a 42 year old male with with a history of alcohol use disorder, GAD, OCD, and PTSD, diagnoses made by his outpatient psychiatric provider.  The patient has a history of psychiatric admission in 2018, in which his girlfriend called the police because she was worried that he was suicidal.  No documented previous self-harm behavior or suicide attempts.   The patient presented on 3/4 with his mother for assessment at the behavioral health urgent care.  He was referred for consideration of facility based crisis admission by his outpatient psychiatric provider.  The patient reports that alcohol has caused significant problems for him throughout his life.  He reports a DUI 6 years ago, issues with anger when intoxicated, problems showing up to work, and interpersonal dysfunction with romantic partners and his family.  He states that he desires to quit and says "I am a slave to it".  He reports that he normally experiences some alcohol withdrawal symptoms such as "cranky and shaky".  He denies experiencing  seizures or delirium tremens.  He reports that he drinks approximately 15 to 1812 ounce beers per day.  He states that he has never done detox before.  He is uncertain what he wants after detox, but he says he may be interested in residential rehab.  His mother states that she would like for [place] to investigate self-pay options in addition to [facility] residential.   The patient denies experiencing recent depression or suicidal thoughts.  He reports smoking cigarettes and some marijuana recently.  He denies use of opioids.   Social history is notable for the patient being born and raised in [place] by his mother and father, the patient completing high school and some trade school, and working as a Dealer for some period of time.  The patient has been unemployed for several months and has been sitting around the house for most of the day.  He reports that he has his own house but that his parents pay for all that he has.  He reports no previous marriage or romantic partners longer than 3 years.    Diagnosis:  Final diagnoses:  Alcohol use disorder   Total Time spent with patient: 20 minutes  Past Psychiatric History: as above Past Medical History: as above Family History: none Family Psychiatric  History: none Social History: as above and per H and P  Additional Social History:  See H and P                  Sleep: Fair  Appetite:  Fair   Current Medications:  Current Facility-Administered Medications  Medication Dose Route Frequency Provider Last Rate Last Admin  acetaminophen (TYLENOL) tablet 650 mg  650 mg Oral Q6H PRN Corky Sox, MD       alum & mag hydroxide-simeth (MAALOX/MYLANTA) 200-200-20 MG/5ML suspension 30 mL  30 mL Oral Q4H PRN Corky Sox, MD       hydrOXYzine (ATARAX) tablet 25 mg  25 mg Oral TID PRN Corky Sox, MD       lisinopril (ZESTRIL) tablet 20 mg  20 mg Oral Daily Corky Sox, MD   20 mg at 05/13/22 [id number]   loperamide (IMODIUM)  capsule 2-4 mg  2-4 mg Oral PRN Corky Sox, MD       LORazepam (ATIVAN) tablet 1 mg  1 mg Oral Q6H PRN Corky Sox, MD   1 mg at 05/13/22 1029   LORazepam (ATIVAN) tablet 1 mg  1 mg Oral BID Corky Sox, MD   1 mg at 05/13/22 [id number]   Followed by   Derrill Memo ON 05/14/2022] LORazepam (ATIVAN) tablet 1 mg  1 mg Oral Daily Corky Sox, MD       magnesium hydroxide (MILK OF MAGNESIA) suspension 30 mL  30 mL Oral Daily PRN Corky Sox, MD       multivitamin with minerals tablet 1 tablet  1 tablet Oral Daily Corky Sox, MD   1 tablet at 05/13/22 [id number]   nicotine (NICODERM CQ - dosed in mg/24 hours) patch 21 mg  21 mg Transdermal Q0600 Corky Sox, MD   21 mg at 05/13/22 [id number]   nicotine polacrilex (NICORETTE) gum 2 mg  2 mg Oral PRN Corky Sox, MD       ondansetron (ZOFRAN-ODT) disintegrating tablet 4 mg  4 mg Oral Q6H PRN Corky Sox, MD   4 mg at 05/13/22 1022   PARoxetine (PAXIL) tablet 40 mg  40 mg Oral Daily Corky Sox, MD   40 mg at 05/13/22 [id number]   propranolol (INDERAL) tablet 20 mg  20 mg Oral BID Corky Sox, MD   20 mg at 05/13/22 [id number]   QUEtiapine (SEROQUEL) tablet 50 mg  50 mg Oral QHS Corky Sox, MD   50 mg at 05/12/22 2111   thiamine (VITAMIN B1) tablet 100 mg  100 mg Oral Daily Corky Sox, MD   100 mg at 05/13/22 [id number]   Current Outpatient Medications  Medication Sig Dispense Refill   lisinopril (ZESTRIL) 20 MG tablet Take 1 tablet (20 mg total) by mouth daily. 30 tablet 1   Multiple Vitamins-Minerals (ADULT GUMMY PO) Take 1 tablet by mouth daily.     PARoxetine (PAXIL) 40 MG tablet Take 1 tablet (40 mg total) by mouth daily. 30 tablet 1   propranolol (INDERAL) 40 MG tablet Take 1 tablet (40 mg total) by mouth 2 (two) times daily. 60 tablet 1   QUEtiapine (SEROQUEL) 200 MG tablet Take 1 tablet (200 mg total) by mouth at bedtime. 30 tablet 1    Labs  Lab Results:  Admission on 05/10/2022  Component Date Value Ref Range Status   SARS  Coronavirus 2 by RT PCR 05/10/2022 NEGATIVE  NEGATIVE Final   Influenza A by PCR 05/10/2022 NEGATIVE  NEGATIVE Final   Influenza B by PCR 05/10/2022 NEGATIVE  NEGATIVE Final   Comment: (NOTE) The Xpert Xpress SARS-CoV-2/FLU/RSV plus assay is intended as an aid in the diagnosis of influenza from Nasopharyngeal swab specimens and should not be used as a sole basis for treatment. Nasal washings and aspirates are unacceptable for Xpert Xpress SARS-CoV-2/FLU/RSV testing.  Fact Sheet for Patients: EntrepreneurPulse.com.au  Fact Sheet for Healthcare Providers:  IncredibleEmployment.be  This test is not yet approved or cleared by the [place] and has been authorized for detection and/or diagnosis of SARS-CoV-2 by FDA under an Emergency Use Authorization (EUA). This EUA will remain in effect (meaning this test can be used) for the duration of the COVID-19 declaration under Section 564(b)(1) of the Act, 21 U.S.C. section 360bbb-3(b)(1), unless the authorization is terminated or revoked.     Resp Syncytial Virus by PCR 05/10/2022 NEGATIVE  NEGATIVE Final   Comment: (NOTE) Fact Sheet for Patients: EntrepreneurPulse.com.au  Fact Sheet for Healthcare Providers: IncredibleEmployment.be  This test is not yet approved or cleared by the [place] FDA and has been authorized for detection and/or diagnosis of SARS-CoV-2 by FDA under an Emergency Use Authorization (EUA). This EUA will remain in effect (meaning this test can be used) for the duration of the COVID-19 declaration under Section 564(b)(1) of the Act, 21 U.S.C. section 360bbb-3(b)(1), unless the authorization is terminated or revoked.  Performed at [facility], [place] [address], [place], [place] [address]    WBC 05/10/2022 6.5  4.0 - 10.5 K/uL Final   RBC 05/10/2022 4.75  4.22 - 5.81 MIL/uL Final   Hemoglobin 05/10/2022 15.9  13.0 - 17.0 g/dL  Final   HCT 05/10/2022 44.5  39.0 - 52.0 % Final   MCV 05/10/2022 93.7  80.0 - 100.0 fL Final   MCH 05/10/2022 33.5  26.0 - 34.0 pg Final   MCHC 05/10/2022 35.7  30.0 - 36.0 g/dL Final   RDW 05/10/2022 12.8  11.5 - 15.5 % Final   Platelets 05/10/2022 245  150 - 400 K/uL Final   nRBC 05/10/2022 0.0  0.0 - 0.2 % Final   Neutrophils Relative % 05/10/2022 51  % Final   Neutro Abs 05/10/2022 3.3  1.7 - 7.7 K/uL Final   Lymphocytes Relative 05/10/2022 33  % Final   Lymphs Abs 05/10/2022 2.2  0.7 - 4.0 K/uL Final   Monocytes Relative 05/10/2022 12  % Final   Monocytes Absolute 05/10/2022 0.8  0.1 - 1.0 K/uL Final   Eosinophils Relative 05/10/2022 2  % Final   Eosinophils Absolute 05/10/2022 0.1  0.0 - 0.5 K/uL Final   Basophils Relative 05/10/2022 1  % Final   Basophils Absolute 05/10/2022 0.1  0.0 - 0.1 K/uL Final   Immature Granulocytes 05/10/2022 1  % Final   Abs Immature Granulocytes 05/10/2022 0.04  0.00 - 0.07 K/uL Final   Performed at [facility], [place] [address], [place], [place] [address]   Sodium 05/10/2022 137  135 - 145 mmol/L Final   Potassium 05/10/2022 3.7  3.5 - 5.1 mmol/L Final   Chloride 05/10/2022 102  98 - 111 mmol/L Final   CO2 05/10/2022 21 (L)  22 - 32 mmol/L Final   Glucose, Bld 05/10/2022 100 (H)  70 - 99 mg/dL Final   Glucose reference range applies only to samples taken after fasting for at least 8 hours.   BUN 05/10/2022 5 (L)  6 - 20 mg/dL Final   Creatinine, Ser 05/10/2022 0.67  0.61 - 1.24 mg/dL Final   Calcium 05/10/2022 9.0  8.9 - 10.3 mg/dL Final   Total Protein 05/10/2022 8.3 (H)  6.5 - 8.1 g/dL Final   Albumin 05/10/2022 4.2  3.5 - 5.0 g/dL Final   AST 05/10/2022 143 (H)  15 - 41 U/L Final   ALT 05/10/2022 163 (H)  0 - 44 U/L Final   Alkaline Phosphatase 05/10/2022 61  38 - 126 U/L Final   Total Bilirubin 05/10/2022 0.6  0.3 - 1.2 mg/dL Final   GFR, Estimated 05/10/2022 >60  >60 mL/min Final   Comment: (NOTE) Calculated using the CKD-EPI  Creatinine Equation (2021)    Anion gap 05/10/2022 14  5 - 15 Final   Performed at [facility] [address], [place], [place] [address]   Hgb A1c MFr Bld 05/10/2022 5.8 (H)  4.8 - 5.6 % Final   Comment: (NOTE)         Prediabetes: 5.7 - 6.4         Diabetes: >6.4         Glycemic control for adults with diabetes: <7.0    Mean Plasma Glucose 05/10/2022 120  mg/dL Final   Comment: (NOTE) Performed At: [facility] [place], [place] [id number] Rush Farmer MD [id number]    Cholesterol 05/10/2022 248 (H)  0 - 200 mg/dL Final   Triglycerides 05/10/2022 69  <150 mg/dL Final   HDL 05/10/2022 110  >40 mg/dL Final   Total CHOL/HDL Ratio 05/10/2022 2.3  RATIO Final   VLDL 05/10/2022 14  0 - 40 mg/dL Final   LDL Cholesterol 05/10/2022 124 (H)  0 - 99 mg/dL Final   Comment:        Total Cholesterol/HDL:CHD Risk Coronary Heart Disease Risk Table                     Men   Women  1/2 Average Risk   3.4   3.3  Average Risk       5.0   4.4  2 X Average Risk   9.6   7.1  3 X Average Risk  23.4   11.0        Use the calculated Patient Ratio above and the CHD Risk Table to determine the patient's CHD Risk.        ATP III CLASSIFICATION (LDL):  <100     mg/dL   Optimal  100-129  mg/dL   Near or Above                    Optimal  130-159  mg/dL   Borderline  160-189  mg/dL   High  >190     mg/dL   Very High Performed at [facility] [address] [place], [place] [address]    TSH 05/10/2022 0.472  0.350 - 4.500 uIU/mL Final   Comment: Performed by a 3rd Generation assay with a functional sensitivity of <=0.01 uIU/mL. Performed at [facility], [place] [address] [place], [place] [address]    POC Amphetamine UR 05/10/2022 None Detected  NONE DETECTED (Cut Off Level 1000 ng/mL) Final   POC Secobarbital (BAR) 05/10/2022 None Detected  NONE DETECTED (Cut Off Level 300 ng/mL) Final   POC Buprenorphine (BUP) 05/10/2022 None Detected  NONE DETECTED  (Cut Off Level 10 ng/mL) Final   POC Oxazepam (BZO) 05/10/2022 None Detected  NONE DETECTED (Cut Off Level 300 ng/mL) Final   POC Cocaine UR 05/10/2022 None Detected  NONE DETECTED (Cut Off Level 300 ng/mL) Final   POC Methamphetamine UR 05/10/2022 None Detected  NONE DETECTED (Cut Off Level 1000 ng/mL) Final   POC Morphine 05/10/2022 None Detected  NONE DETECTED (Cut Off Level 300 ng/mL) Final   POC Methadone UR 05/10/2022 None Detected  NONE DETECTED (Cut Off Level 300 ng/mL) Final   POC Oxycodone UR 05/10/2022 None  Detected  NONE DETECTED (Cut Off Level 100 ng/mL) Final   POC Marijuana UR 05/10/2022 Positive (A)  NONE DETECTED (Cut Off Level 50 ng/mL) Final   SARSCOV2ONAVIRUS 2 AG 05/10/2022 NEGATIVE  NEGATIVE Final   Comment: (NOTE) SARS-CoV-2 antigen NOT DETECTED.   Negative results are presumptive.  Negative results do not preclude SARS-CoV-2 infection and should not be used as the sole basis for treatment or other patient management decisions, including infection  control decisions, particularly in the presence of clinical signs and  symptoms consistent with COVID-19, or in those who have been in contact with the virus.  Negative results must be combined with clinical observations, patient history, and epidemiological information. The expected result is Negative.  Fact Sheet for Patients: HandmadeRecipes.com.cy  Fact Sheet for Healthcare Providers: FuneralLife.at  This test is not yet approved or cleared by the [place] FDA and  has been authorized for detection and/or diagnosis of SARS-CoV-2 by FDA under an Emergency Use Authorization (EUA).  This EUA will remain in effect (meaning this test can be used) for the duration of  the COV                          ID-19 declaration under Section 564(b)(1) of the Act, 21 U.S.C. section 360bbb-3(b)(1), unless the authorization is terminated or revoked sooner.    Orders Only on  12/29/2021  Component Date Value Ref Range Status   WBC 12/29/2021 5.8  3.4 - 10.8 x10E3/uL Final   RBC 12/29/2021 4.75  4.14 - 5.80 x10E6/uL Final   Hemoglobin 12/29/2021 15.4  13.0 - 17.7 g/dL Final   Hematocrit 12/29/2021 45.1  37.5 - 51.0 % Final   MCV 12/29/2021 95  79 - 97 fL Final   MCH 12/29/2021 32.4  26.6 - 33.0 pg Final   MCHC 12/29/2021 34.1  31.5 - 35.7 g/dL Final   RDW 12/29/2021 12.9  11.6 - 15.4 % Final   Platelets 12/29/2021 230  150 - 450 x10E3/uL Final   Neutrophils 12/29/2021 54  Not Estab. % Final   Lymphs 12/29/2021 26  Not Estab. % Final   Monocytes 12/29/2021 16  Not Estab. % Final   Eos 12/29/2021 3  Not Estab. % Final   Basos 12/29/2021 1  Not Estab. % Final   Neutrophils Absolute 12/29/2021 3.1  1.4 - 7.0 x10E3/uL Final   Lymphocytes Absolute 12/29/2021 1.5  0.7 - 3.1 x10E3/uL Final   Monocytes Absolute 12/29/2021 0.9  0.1 - 0.9 x10E3/uL Final   EOS (ABSOLUTE) 12/29/2021 0.2  0.0 - 0.4 x10E3/uL Final   Basophils Absolute 12/29/2021 0.1  0.0 - 0.2 x10E3/uL Final   Immature Granulocytes 12/29/2021 0  Not Estab. % Final   Immature Grans (Abs) 12/29/2021 0.0  0.0 - 0.1 x10E3/uL Final   Glucose 12/29/2021 105 (H)  70 - 99 mg/dL Final   BUN 12/29/2021 7  6 - 20 mg/dL Final   Creatinine, Ser 12/29/2021 0.73 (L)  0.76 - 1.27 mg/dL Final   eGFR 12/29/2021 119  >59 mL/min/1.73 Final   BUN/Creatinine Ratio 12/29/2021 10  9 - 20 Final   Sodium 12/29/2021 139  134 - 144 mmol/L Final   Potassium 12/29/2021 4.0  3.5 - 5.2 mmol/L Final   Chloride 12/29/2021 103  96 - 106 mmol/L Final   CO2 12/29/2021 22  20 - 29 mmol/L Final   Calcium 12/29/2021 9.0  8.7 - 10.2 mg/dL Final   Total Protein 12/29/2021 6.9  6.0 - 8.5 g/dL Final   Albumin 12/29/2021 4.2  4.1 - 5.1 g/dL Final   Globulin, Total 12/29/2021 2.7  1.5 - 4.5 g/dL Final   Albumin/Globulin Ratio 12/29/2021 1.6  1.2 - 2.2 Final   Bilirubin Total 12/29/2021 <0.2  0.0 - 1.2 mg/dL Final   Alkaline Phosphatase  12/29/2021 74  44 - 121 IU/L Final   AST 12/29/2021 87 (H)  0 - 40 IU/L Final   ALT 12/29/2021 76 (H)  0 - 44 IU/L Final   Cholesterol, Total 12/29/2021 169  100 - 199 mg/dL Final   Triglycerides 12/29/2021 61  0 - 149 mg/dL Final   HDL 12/29/2021 77  >39 mg/dL Final   VLDL Cholesterol Cal 12/29/2021 12  5 - 40 mg/dL Final   LDL Chol Calc (NIH) 12/29/2021 80  0 - 99 mg/dL Final   Chol/HDL Ratio 12/29/2021 2.2  0.0 - 5.0 ratio Final   Comment:                                   T. Chol/HDL Ratio                                             Men  Women                               1/2 Avg.Risk  3.4    3.3                                   Avg.Risk  5.0    4.4                                2X Avg.Risk  9.6    7.1                                3X Avg.Risk 23.4   11.0    Hgb A1c MFr Bld 12/29/2021 5.7 (H)  4.8 - 5.6 % Final   Comment:          Prediabetes: 5.7 - 6.4          Diabetes: >6.4          Glycemic control for adults with diabetes: <7.0    Est. average glucose Bld gHb Est-m* 12/29/2021 117  mg/dL Final   specimen status report 12/29/2021 Comment   Final   Comment: Please note Please note The date and/or time of collection was not indicated on the requisition as required by state and federal law.  The date of receipt of the specimen was used as the collection date if not supplied.     Blood Alcohol level:  Lab Results  Component Value Date   ETH 281 (H) 01/25/2017   ETH (H) 07/29/2008    186        LOWEST DETECTABLE LIMIT FOR SERUM ALCOHOL IS 5 mg/dL FOR MEDICAL PURPOSES ONLY    Metabolic Disorder Labs: Lab Results  Component Value Date   HGBA1C 5.8 (H) 05/10/2022   MPG 120 05/10/2022  No results found for: "PROLACTIN" Lab Results  Component Value Date   CHOL 248 (H) 05/10/2022   TRIG 69 05/10/2022   HDL 110 05/10/2022   CHOLHDL 2.3 05/10/2022   VLDL 14 05/10/2022   LDLCALC 124 (H) 05/10/2022   LDLCALC 80 12/29/2021    Therapeutic Lab Levels: No  results found for: "LITHIUM" No results found for: "VALPROATE" No results found for: "CBMZ"  Physical Findings   AIMS    Flowsheet Row Admission (Discharged) from 01/26/2017 in [facility]  AIMS Total Score 0      AUDIT    Flowsheet Row Counselor from 04/01/2021 in [facility] Admission (Discharged) from 01/26/2017 in [facility]  Alcohol Use Disorder Identification Test Final Score (AUDIT) 13 9      GAD-7    Flowsheet Row Counselor from 11/10/2021 in [facility] Counselor from 09/23/2021 in [facility] Counselor from 09/01/2021 in [facility] Counselor from 08/04/2021 in [facility] Counselor from 07/07/2021 in [facility]  Total GAD-7 Score '8 5 9 6 18      '$ PHQ2-9    [place] ED from 05/10/2022 in [facility] Counselor from 11/10/2021 in [facility] Counselor from 09/23/2021 in [facility] Counselor from 09/01/2021 in [facility] Counselor from 07/07/2021 in [facility]  PHQ-2 Total Score 0 0 0 0 1  PHQ-9 Total Score 0 '3 5 3 3      '$ Flowsheet Row ED from 05/10/2022 in [facility] Counselor from 09/23/2021 in [facility] Counselor from 04/01/2021 in [facility] No Risk No Risk No Risk        Musculoskeletal  Strength & Muscle Tone: within normal limits Gait & Station: normal Patient leans: N/A  Psychiatric Specialty Exam  Presentation General Appearance: Appropriate for Environment  Eye Contact:Fair  Speech:Clear and Coherent  Speech Volume:Normal  Handedness:-- (not assessed)   Mood and  Affect  Mood:Euthymic  Affect:Congruent   Thought Process  Thought Processes:Coherent; Linear  Descriptions of Associations:Intact  Orientation:Full (Time, Place and Person)  Thought Content:Logical    Hallucinations:Hallucinations: None  Ideas of Reference:None  Suicidal Thoughts:Suicidal Thoughts: No  Homicidal Thoughts:Homicidal Thoughts: No   Sensorium  Memory:Immediate Fair; Recent Fair; Remote Fair  Judgment:Fair  Insight:Fair   Executive Functions  Concentration:Fair  Attention Span:Fair  [place]   Psychomotor Activity  Psychomotor Activity:Psychomotor Activity: Normal   Assets  Assets:Communication Skills; Resilience   Sleep  Sleep:Sleep: Fair   Nutritional Assessment (For OBS and FBC admissions only) Has the patient had a weight loss or gain of 10 pounds or more in the last 3 months?: No Has the patient had a decrease in food intake/or appetite?: Yes Does the patient have dental problems?: No Does the patient have eating habits or behaviors that may be indicators of an eating disorder including binging or inducing vomiting?: No Has the patient recently lost weight without trying?: 0 Has the patient been eating poorly because of a decreased appetite?: 0 Malnutrition Screening Tool Score: 0    Physical Exam Constitutional:      Appearance: the patient is not toxic-appearing.  Pulmonary:     Effort: Pulmonary effort is normal.  Neurological:  General: No focal deficit present.     Mental Status: the patient is alert and oriented to person, place, and time.   Review of Systems  Respiratory:  Negative for shortness of breath.   Cardiovascular:  Negative for chest pain.  Gastrointestinal:  Negative for abdominal pain, constipation, diarrhea, nausea and vomiting.  Neurological:  Negative for headaches.    BP (!) 146/99   Pulse 88   Temp 98.4 F (36.9 C) (Oral)   Resp 20   SpO2 100%    Assessment and Plan:  Status: Voluntary, may be discharged AMA should the patient request, given lack of documented self-harm behavior and no apparent suicidal thoughts   Alcohol use disorder, history of moderate withdrawal symptoms - Ativan taper with CIWA and as needed Ativan   Restart home medications, different dosages been as documented in the medical record, given the patient's lack of adherence - Propranolol 20 mg twice daily - Seroquel 50 mg nightly - Paxil 40 mg daily - Lisinopril 20 mg daily   Medical: Adding regular propranolol and lisinopril should help with hypertension and tachycardia.  The patient's hypertension is consistent with the patient's vitals at outpatient clinic. Routine labs: Notable for slight elevation in LFTs, UDS positive for marijuana    Corky Sox, MD 05/13/2022 1:35 PM

## 2022-05-13 NOTE — ED Notes (Signed)
Patient in dayroom watching TV. Respirations equal and unlabored, skin warm and dry, NAD. No change in assessment or acuity. Q 15 minute safety checks remain in place.

## 2022-05-13 NOTE — ED Notes (Signed)
Patient A&Ox4. Patient denies SI/Hi and AVH.  Patient denies any physical complaints when asked. No acute distress noted. Support and encouragement provided. Routine safety checks conducted according to facility protocol. Encouraged patient to notify staff if thoughts of harm toward self or others arise. Patient verbalize understanding and agreement. Will continue to monitor for safety.    

## 2022-05-13 NOTE — ED Notes (Signed)
Pt in dayroom watching television. He is calm and cooperative. Alert and orient x 4. No c/o pain or distress. Will continue to monitor for safety

## 2022-05-13 NOTE — ED Notes (Signed)
Patient in dayroom watching TV with peers. Respirations equal and unlabored, skin warm and dry, NAD. No change in assessment or acuity. Q 15 minute safety checks remain in place.

## 2022-05-13 NOTE — ED Notes (Signed)
Patient reported he didn't  feel like going to group. Patient excused by this Probation officer.

## 2022-05-13 NOTE — Discharge Planning (Signed)
LCSW received call from Admissions Coordinator Kiera at [facility] regarding referral. Per Enis Gash, they are currently verifying the patient's insurance and will follow up once confirmed.   LCSW followed up with [facility] in [place] regarding referral. LCSW spoke with Admissions Coordinator [place] who reports he has been in contact with the patient's parents and informed them that the patient is appropriate for admission. Josh reports the family is aware that they may have to self pay as Medicaid is showing up for the patient. Josh reports the family expressed understanding and is figuring out a plan for admission. Josh reports he can speak with patient directly to explore what he would like to do, and if willing to come then they can take him as early as tomorrow. Contact number provided to the patient for follow up: [phone]. Patient aware of plan and states he will follow up. No other needs were reported at this time.   LCSW will continue to follow and provide support to patient while on [facility] unit.   Lucius Conn, LCSW Clinical Social Worker [facility] Ph: [phone]

## 2022-05-14 DIAGNOSIS — F319 Bipolar disorder, unspecified: Secondary | ICD-10-CM | POA: Diagnosis not present

## 2022-05-14 DIAGNOSIS — Z1152 Encounter for screening for COVID-19: Secondary | ICD-10-CM | POA: Diagnosis not present

## 2022-05-14 DIAGNOSIS — F429 Obsessive-compulsive disorder, unspecified: Secondary | ICD-10-CM | POA: Diagnosis not present

## 2022-05-14 DIAGNOSIS — R000 Tachycardia, unspecified: Secondary | ICD-10-CM | POA: Diagnosis not present

## 2022-05-14 DIAGNOSIS — F411 Generalized anxiety disorder: Secondary | ICD-10-CM | POA: Diagnosis not present

## 2022-05-14 DIAGNOSIS — F17210 Nicotine dependence, cigarettes, uncomplicated: Secondary | ICD-10-CM | POA: Diagnosis not present

## 2022-05-14 DIAGNOSIS — F4310 Post-traumatic stress disorder, unspecified: Secondary | ICD-10-CM | POA: Diagnosis not present

## 2022-05-14 DIAGNOSIS — I10 Essential (primary) hypertension: Secondary | ICD-10-CM | POA: Diagnosis not present

## 2022-05-14 DIAGNOSIS — F1090 Alcohol use, unspecified, uncomplicated: Secondary | ICD-10-CM | POA: Diagnosis not present

## 2022-05-14 NOTE — Progress Notes (Signed)
Pt had lunch and is currently watching TV. No distress noted. Staff will monitor for pt's safety.

## 2022-05-14 NOTE — ED Notes (Signed)
Pt sleeping@this time. Breathing even and unlabored. Will continue to monitor for safety 

## 2022-05-14 NOTE — Progress Notes (Signed)
Pt's CIWA was 0. 

## 2022-05-14 NOTE — Progress Notes (Signed)
Pt declined to go to the courtyard for fresh air.

## 2022-05-14 NOTE — ED Provider Notes (Signed)
Behavioral Health Progress Note  Date and Time: 05/14/2022 3:39 PM Name: [name] MRN:  [id number]  Subjective:   On assessment 3/8, the patient is calm and in no acute distress.  He reports that he is doing better compared to yesterday.  He has no physical complaints.  He reports that the co-pay from [place] will make it impossible for him to attend.  We are waiting on a response from [facility] residential.  The patient denies auditory/visual hallucinations.  The patient reports good mood, appetite, and sleep. They deny suicidal and homicidal thoughts. The patient denies side effects from their medications.  Review of systems as below. The patient denies experiencing any withdrawal symptoms.   On assessment 3/7, the patient reports that he feels nauseous and has a leg cramp.  Discussed plan for additional dose of Ativan as well as Zofran.  Will assess response, some concern for alcohol withdrawal.  LCSW reports waiting on follow-up from [place] in [facility].  On assessment 3/6, the patient denies experiencing withdrawal symptoms except for occasional "crankiness".  Discussed work by [employer] to find residential rehab placement.  The patient denies auditory/visual hallucinations.  The patient reports good mood, appetite, and sleep. They deny suicidal and homicidal thoughts. The patient denies side effects from their medications.  Review of systems as below. The patient denies experiencing any withdrawal symptoms.    Information obtained on initial evaluation: The patient is a 42 year old male with with a history of alcohol use disorder, GAD, OCD, and PTSD, diagnoses made by his outpatient psychiatric provider.  The patient has a history of psychiatric admission in 2018, in which his girlfriend called the police because she was worried that he was suicidal.  No documented previous self-harm behavior or suicide attempts.   The patient presented on 3/4 with his mother for assessment at the  behavioral health urgent care.  He was referred for consideration of facility based crisis admission by his outpatient psychiatric provider.  The patient reports that alcohol has caused significant problems for him throughout his life.  He reports a DUI 6 years ago, issues with anger when intoxicated, problems showing up to work, and interpersonal dysfunction with romantic partners and his family.  He states that he desires to quit and says "I am a slave to it".  He reports that he normally experiences some alcohol withdrawal symptoms such as "cranky and shaky".  He denies experiencing seizures or delirium tremens.  He reports that he drinks approximately 15 to 1812 ounce beers per day.  He states that he has never done detox before.  He is uncertain what he wants after detox, but he says he may be interested in residential rehab.  His mother states that she would like for [place] to investigate self-pay options in addition to [facility] residential.   The patient denies experiencing recent depression or suicidal thoughts.  He reports smoking cigarettes and some marijuana recently.  He denies use of opioids.   Social history is notable for the patient being born and raised in [place] by his mother and father, the patient completing high school and some trade school, and working as a Dealer for some period of time.  The patient has been unemployed for several months and has been sitting around the house for most of the day.  He reports that he has his own house but that his parents pay for all that he has.  He reports no previous marriage or romantic partners longer than 3 years.  Diagnosis:  Final diagnoses:  Alcohol use disorder   Total Time spent with patient: 20 minutes  Past Psychiatric History: as above Past Medical History: as above Family History: none Family Psychiatric  History: none Social History: as above and per H and P  Additional Social History:  See H and P                   Sleep: Fair  Appetite:  Fair   Current Medications:  Current Facility-Administered Medications  Medication Dose Route Frequency Provider Last Rate Last Admin   acetaminophen (TYLENOL) tablet 650 mg  650 mg Oral Q6H PRN Corky Sox, MD       alum & mag hydroxide-simeth (MAALOX/MYLANTA) 200-200-20 MG/5ML suspension 30 mL  30 mL Oral Q4H PRN Corky Sox, MD       hydrOXYzine (ATARAX) tablet 25 mg  25 mg Oral TID PRN Corky Sox, MD   25 mg at 05/13/22 1624   lisinopril (ZESTRIL) tablet 20 mg  20 mg Oral Daily Corky Sox, MD   20 mg at 05/14/22 0856   magnesium hydroxide (MILK OF MAGNESIA) suspension 30 mL  30 mL Oral Daily PRN Corky Sox, MD       multivitamin with minerals tablet 1 tablet  1 tablet Oral Daily Corky Sox, MD   1 tablet at 05/14/22 0856   nicotine (NICODERM CQ - dosed in mg/24 hours) patch 21 mg  21 mg Transdermal Q0600 Corky Sox, MD   21 mg at 05/14/22 0602   nicotine polacrilex (NICORETTE) gum 2 mg  2 mg Oral PRN Corky Sox, MD       PARoxetine (PAXIL) tablet 40 mg  40 mg Oral Daily Corky Sox, MD   40 mg at 05/14/22 0855   propranolol (INDERAL) tablet 20 mg  20 mg Oral BID Corky Sox, MD   20 mg at 05/14/22 0855   QUEtiapine (SEROQUEL) tablet 50 mg  50 mg Oral QHS Corky Sox, MD   50 mg at 05/13/22 2108   thiamine (VITAMIN B1) tablet 100 mg  100 mg Oral Daily Corky Sox, MD   100 mg at 05/14/22 [id number]   Current Outpatient Medications  Medication Sig Dispense Refill   lisinopril (ZESTRIL) 20 MG tablet Take 1 tablet (20 mg total) by mouth daily. 30 tablet 1   Multiple Vitamins-Minerals (ADULT GUMMY PO) Take 1 tablet by mouth daily.     PARoxetine (PAXIL) 40 MG tablet Take 1 tablet (40 mg total) by mouth daily. 30 tablet 1   propranolol (INDERAL) 40 MG tablet Take 1 tablet (40 mg total) by mouth 2 (two) times daily. 60 tablet 1   QUEtiapine (SEROQUEL) 200 MG tablet Take 1 tablet (200 mg total) by mouth at bedtime. 30  tablet 1    Labs  Lab Results:  Admission on 05/10/2022  Component Date Value Ref Range Status   SARS Coronavirus 2 by RT PCR 05/10/2022 NEGATIVE  NEGATIVE Final   Influenza A by PCR 05/10/2022 NEGATIVE  NEGATIVE Final   Influenza B by PCR 05/10/2022 NEGATIVE  NEGATIVE Final   Comment: (NOTE) The Xpert Xpress SARS-CoV-2/FLU/RSV plus assay is intended as an aid in the diagnosis of influenza from Nasopharyngeal swab specimens and should not be used as a sole basis for treatment. Nasal washings and aspirates are unacceptable for Xpert Xpress SARS-CoV-2/FLU/RSV testing.  Fact Sheet for Patients: EntrepreneurPulse.com.au  Fact Sheet for Healthcare Providers: IncredibleEmployment.be  This test is not yet approved or cleared by the [place]  FDA and has been authorized for detection and/or diagnosis of SARS-CoV-2 by FDA under an Emergency Use Authorization (EUA). This EUA will remain in effect (meaning this test can be used) for the duration of the COVID-19 declaration under Section 564(b)(1) of the Act, 21 U.S.C. section 360bbb-3(b)(1), unless the authorization is terminated or revoked.     Resp Syncytial Virus by PCR 05/10/2022 NEGATIVE  NEGATIVE Final   Comment: (NOTE) Fact Sheet for Patients: EntrepreneurPulse.com.au  Fact Sheet for Healthcare Providers: IncredibleEmployment.be  This test is not yet approved or cleared by the [place] FDA and has been authorized for detection and/or diagnosis of SARS-CoV-2 by FDA under an Emergency Use Authorization (EUA). This EUA will remain in effect (meaning this test can be used) for the duration of the COVID-19 declaration under Section 564(b)(1) of the Act, 21 U.S.C. section 360bbb-3(b)(1), unless the authorization is terminated or revoked.  Performed at [facility], [place] [address] [place], [place] [address]    WBC 05/10/2022 6.5  4.0 - 10.5  K/uL Final   RBC 05/10/2022 4.75  4.22 - 5.81 MIL/uL Final   Hemoglobin 05/10/2022 15.9  13.0 - 17.0 g/dL Final   HCT 05/10/2022 44.5  39.0 - 52.0 % Final   MCV 05/10/2022 93.7  80.0 - 100.0 fL Final   MCH 05/10/2022 33.5  26.0 - 34.0 pg Final   MCHC 05/10/2022 35.7  30.0 - 36.0 g/dL Final   RDW 05/10/2022 12.8  11.5 - 15.5 % Final   Platelets 05/10/2022 245  150 - 400 K/uL Final   nRBC 05/10/2022 0.0  0.0 - 0.2 % Final   Neutrophils Relative % 05/10/2022 51  % Final   Neutro Abs 05/10/2022 3.3  1.7 - 7.7 K/uL Final   Lymphocytes Relative 05/10/2022 33  % Final   Lymphs Abs 05/10/2022 2.2  0.7 - 4.0 K/uL Final   Monocytes Relative 05/10/2022 12  % Final   Monocytes Absolute 05/10/2022 0.8  0.1 - 1.0 K/uL Final   Eosinophils Relative 05/10/2022 2  % Final   Eosinophils Absolute 05/10/2022 0.1  0.0 - 0.5 K/uL Final   Basophils Relative 05/10/2022 1  % Final   Basophils Absolute 05/10/2022 0.1  0.0 - 0.1 K/uL Final   Immature Granulocytes 05/10/2022 1  % Final   Abs Immature Granulocytes 05/10/2022 0.04  0.00 - 0.07 K/uL Final   Performed at [facility], [place] [address] [place], [place] [address]   Sodium 05/10/2022 137  135 - 145 mmol/L Final   Potassium 05/10/2022 3.7  3.5 - 5.1 mmol/L Final   Chloride 05/10/2022 102  98 - 111 mmol/L Final   CO2 05/10/2022 21 (L)  22 - 32 mmol/L Final   Glucose, Bld 05/10/2022 100 (H)  70 - 99 mg/dL Final   Glucose reference range applies only to samples taken after fasting for at least 8 hours.   BUN 05/10/2022 5 (L)  6 - 20 mg/dL Final   Creatinine, Ser 05/10/2022 0.67  0.61 - 1.24 mg/dL Final   Calcium 05/10/2022 9.0  8.9 - 10.3 mg/dL Final   Total Protein 05/10/2022 8.3 (H)  6.5 - 8.1 g/dL Final   Albumin 05/10/2022 4.2  3.5 - 5.0 g/dL Final   AST 05/10/2022 143 (H)  15 - 41 U/L Final   ALT 05/10/2022 163 (H)  0 - 44 U/L Final   Alkaline Phosphatase 05/10/2022 61  38 - 126 U/L Final   Total Bilirubin 05/10/2022 0.6  0.3 -  1.2 mg/dL  Final   GFR, Estimated 05/10/2022 >60  >60 mL/min Final   Comment: (NOTE) Calculated using the CKD-EPI Creatinine Equation (2021)    Anion gap 05/10/2022 14  5 - 15 Final   Performed at [facility], [place] [address] [place], [place] [address]   Hgb A1c MFr Bld 05/10/2022 5.8 (H)  4.8 - 5.6 % Final   Comment: (NOTE)         Prediabetes: 5.7 - 6.4         Diabetes: >6.4         Glycemic control for adults with diabetes: <7.0    Mean Plasma Glucose 05/10/2022 120  mg/dL Final   Comment: (NOTE) Performed At: [facility] [place], [place] [id number] Rush Farmer MD [id number]    Cholesterol 05/10/2022 248 (H)  0 - 200 mg/dL Final   Triglycerides 05/10/2022 69  <150 mg/dL Final   HDL 05/10/2022 110  >40 mg/dL Final   Total CHOL/HDL Ratio 05/10/2022 2.3  RATIO Final   VLDL 05/10/2022 14  0 - 40 mg/dL Final   LDL Cholesterol 05/10/2022 124 (H)  0 - 99 mg/dL Final   Comment:        Total Cholesterol/HDL:CHD Risk Coronary Heart Disease Risk Table                     Men   Women  1/2 Average Risk   3.4   3.3  Average Risk       5.0   4.4  2 X Average Risk   9.6   7.1  3 X Average Risk  23.4   11.0        Use the calculated Patient Ratio above and the CHD Risk Table to determine the patient's CHD Risk.        ATP III CLASSIFICATION (LDL):  <100     mg/dL   Optimal  100-129  mg/dL   Near or Above                    Optimal  130-159  mg/dL   Borderline  160-189  mg/dL   High  >190     mg/dL   Very High Performed at [facility] [address], [place], [place] [address]    TSH 05/10/2022 0.472  0.350 - 4.500 uIU/mL Final   Comment: Performed by a 3rd Generation assay with a functional sensitivity of <=0.01 uIU/mL. Performed at [facility], [place] [address], [place], [place] [address]    POC Amphetamine UR 05/10/2022 None Detected  NONE DETECTED (Cut Off Level 1000 ng/mL) Final   POC Secobarbital (BAR) 05/10/2022 None Detected  NONE  DETECTED (Cut Off Level 300 ng/mL) Final   POC Buprenorphine (BUP) 05/10/2022 None Detected  NONE DETECTED (Cut Off Level 10 ng/mL) Final   POC Oxazepam (BZO) 05/10/2022 None Detected  NONE DETECTED (Cut Off Level 300 ng/mL) Final   POC Cocaine UR 05/10/2022 None Detected  NONE DETECTED (Cut Off Level 300 ng/mL) Final   POC Methamphetamine UR 05/10/2022 None Detected  NONE DETECTED (Cut Off Level 1000 ng/mL) Final   POC Morphine 05/10/2022 None Detected  NONE DETECTED (Cut Off Level 300 ng/mL) Final   POC Methadone UR 05/10/2022 None Detected  NONE DETECTED (Cut Off Level 300 ng/mL) Final   POC Oxycodone UR 05/10/2022 None Detected  NONE DETECTED (Cut Off Level 100 ng/mL) Final   POC Marijuana  UR 05/10/2022 Positive (A)  NONE DETECTED (Cut Off Level 50 ng/mL) Final   SARSCOV2ONAVIRUS 2 AG 05/10/2022 NEGATIVE  NEGATIVE Final   Comment: (NOTE) SARS-CoV-2 antigen NOT DETECTED.   Negative results are presumptive.  Negative results do not preclude SARS-CoV-2 infection and should not be used as the sole basis for treatment or other patient management decisions, including infection  control decisions, particularly in the presence of clinical signs and  symptoms consistent with COVID-19, or in those who have been in contact with the virus.  Negative results must be combined with clinical observations, patient history, and epidemiological information. The expected result is Negative.  Fact Sheet for Patients: HandmadeRecipes.com.cy  Fact Sheet for Healthcare Providers: FuneralLife.at  This test is not yet approved or cleared by the [place] FDA and  has been authorized for detection and/or diagnosis of SARS-CoV-2 by FDA under an Emergency Use Authorization (EUA).  This EUA will remain in effect (meaning this test can be used) for the duration of  the COV                          ID-19 declaration under Section 564(b)(1) of the Act, 21 U.S.C.  section 360bbb-3(b)(1), unless the authorization is terminated or revoked sooner.     Sodium 05/13/2022 134 (L)  135 - 145 mmol/L Final   Potassium 05/13/2022 5.2 (H)  3.5 - 5.1 mmol/L Final   Chloride 05/13/2022 100  98 - 111 mmol/L Final   CO2 05/13/2022 23  22 - 32 mmol/L Final   Glucose, Bld 05/13/2022 105 (H)  70 - 99 mg/dL Final   Glucose reference range applies only to samples taken after fasting for at least 8 hours.   BUN 05/13/2022 16  6 - 20 mg/dL Final   Creatinine, Ser 05/13/2022 0.94  0.61 - 1.24 mg/dL Final   Calcium 05/13/2022 9.9  8.9 - 10.3 mg/dL Final   Total Protein 05/13/2022 7.3  6.5 - 8.1 g/dL Final   Albumin 05/13/2022 3.9  3.5 - 5.0 g/dL Final   AST 05/13/2022 119 (H)  15 - 41 U/L Final   ALT 05/13/2022 145 (H)  0 - 44 U/L Final   Alkaline Phosphatase 05/13/2022 59  38 - 126 U/L Final   Total Bilirubin 05/13/2022 0.6  0.3 - 1.2 mg/dL Final   GFR, Estimated 05/13/2022 >60  >60 mL/min Final   Comment: (NOTE) Calculated using the CKD-EPI Creatinine Equation (2021)    Anion gap 05/13/2022 11  5 - 15 Final   Performed at [facility] [address] [place], [place] [address]  Orders Only on 12/29/2021  Component Date Value Ref Range Status   WBC 12/29/2021 5.8  3.4 - 10.8 x10E3/uL Final   RBC 12/29/2021 4.75  4.14 - 5.80 x10E6/uL Final   Hemoglobin 12/29/2021 15.4  13.0 - 17.7 g/dL Final   Hematocrit 12/29/2021 45.1  37.5 - 51.0 % Final   MCV 12/29/2021 95  79 - 97 fL Final   MCH 12/29/2021 32.4  26.6 - 33.0 pg Final   MCHC 12/29/2021 34.1  31.5 - 35.7 g/dL Final   RDW 12/29/2021 12.9  11.6 - 15.4 % Final   Platelets 12/29/2021 230  150 - 450 x10E3/uL Final   Neutrophils 12/29/2021 54  Not Estab. % Final   Lymphs 12/29/2021 26  Not Estab. % Final   Monocytes 12/29/2021 16  Not Estab. % Final   Eos 12/29/2021 3  Not  Estab. % Final   Basos 12/29/2021 1  Not Estab. % Final   Neutrophils Absolute 12/29/2021 3.1  1.4 - 7.0 x10E3/uL Final   Lymphocytes  Absolute 12/29/2021 1.5  0.7 - 3.1 x10E3/uL Final   Monocytes Absolute 12/29/2021 0.9  0.1 - 0.9 x10E3/uL Final   EOS (ABSOLUTE) 12/29/2021 0.2  0.0 - 0.4 x10E3/uL Final   Basophils Absolute 12/29/2021 0.1  0.0 - 0.2 x10E3/uL Final   Immature Granulocytes 12/29/2021 0  Not Estab. % Final   Immature Grans (Abs) 12/29/2021 0.0  0.0 - 0.1 x10E3/uL Final   Glucose 12/29/2021 105 (H)  70 - 99 mg/dL Final   BUN 12/29/2021 7  6 - 20 mg/dL Final   Creatinine, Ser 12/29/2021 0.73 (L)  0.76 - 1.27 mg/dL Final   eGFR 12/29/2021 119  >59 mL/min/1.73 Final   BUN/Creatinine Ratio 12/29/2021 10  9 - 20 Final   Sodium 12/29/2021 139  134 - 144 mmol/L Final   Potassium 12/29/2021 4.0  3.5 - 5.2 mmol/L Final   Chloride 12/29/2021 103  96 - 106 mmol/L Final   CO2 12/29/2021 22  20 - 29 mmol/L Final   Calcium 12/29/2021 9.0  8.7 - 10.2 mg/dL Final   Total Protein 12/29/2021 6.9  6.0 - 8.5 g/dL Final   Albumin 12/29/2021 4.2  4.1 - 5.1 g/dL Final   Globulin, Total 12/29/2021 2.7  1.5 - 4.5 g/dL Final   Albumin/Globulin Ratio 12/29/2021 1.6  1.2 - 2.2 Final   Bilirubin Total 12/29/2021 <0.2  0.0 - 1.2 mg/dL Final   Alkaline Phosphatase 12/29/2021 74  44 - 121 IU/L Final   AST 12/29/2021 87 (H)  0 - 40 IU/L Final   ALT 12/29/2021 76 (H)  0 - 44 IU/L Final   Cholesterol, Total 12/29/2021 169  100 - 199 mg/dL Final   Triglycerides 12/29/2021 61  0 - 149 mg/dL Final   HDL 12/29/2021 77  >39 mg/dL Final   VLDL Cholesterol Cal 12/29/2021 12  5 - 40 mg/dL Final   LDL Chol Calc (NIH) 12/29/2021 80  0 - 99 mg/dL Final   Chol/HDL Ratio 12/29/2021 2.2  0.0 - 5.0 ratio Final   Comment:                                   T. Chol/HDL Ratio                                             Men  Women                               1/2 Avg.Risk  3.4    3.3                                   Avg.Risk  5.0    4.4                                2X Avg.Risk  9.6    7.1  3X Avg.Risk 23.4   11.0    Hgb  A1c MFr Bld 12/29/2021 5.7 (H)  4.8 - 5.6 % Final   Comment:          Prediabetes: 5.7 - 6.4          Diabetes: >6.4          Glycemic control for adults with diabetes: <7.0    Est. average glucose Bld gHb Est-m* 12/29/2021 117  mg/dL Final   specimen status report 12/29/2021 Comment   Final   Comment: Please note Please note The date and/or time of collection was not indicated on the requisition as required by state and federal law.  The date of receipt of the specimen was used as the collection date if not supplied.     Blood Alcohol level:  Lab Results  Component Value Date   ETH 281 (H) 01/25/2017   ETH (H) 07/29/2008    186        LOWEST DETECTABLE LIMIT FOR SERUM ALCOHOL IS 5 mg/dL FOR MEDICAL PURPOSES ONLY    Metabolic Disorder Labs: Lab Results  Component Value Date   HGBA1C 5.8 (H) 05/10/2022   MPG 120 05/10/2022   No results found for: "PROLACTIN" Lab Results  Component Value Date   CHOL 248 (H) 05/10/2022   TRIG 69 05/10/2022   HDL 110 05/10/2022   CHOLHDL 2.3 05/10/2022   VLDL 14 05/10/2022   LDLCALC 124 (H) 05/10/2022   LDLCALC 80 12/29/2021    Therapeutic Lab Levels: No results found for: "LITHIUM" No results found for: "VALPROATE" No results found for: "CBMZ"  Physical Findings   AIMS    Flowsheet Row Admission (Discharged) from 01/26/2017 in [place] [facility]  AIMS Total Score 0      AUDIT    Flowsheet Row Counselor from 04/01/2021 in [facility] Admission (Discharged) from 01/26/2017 in [place] [facility]  Alcohol Use Disorder Identification Test Final Score (AUDIT) 13 9      GAD-7    Flowsheet Row Counselor from 11/10/2021 in [facility] Counselor from 09/23/2021 in [facility] Counselor from 09/01/2021 in [facility] Counselor from 08/04/2021 in [facility] Counselor from 07/07/2021 in [facility]  Total GAD-7 Score '8 5 9 6 18      '$ PHQ2-9    Flowsheet Row ED from 05/10/2022 in [facility] Counselor from 11/10/2021 in [facility] Counselor from 09/23/2021 in [facility] Counselor from 09/01/2021 in [facility] Counselor from 07/07/2021 in [facility]  PHQ-2 Total Score 0 0 0 0 1  PHQ-9 Total Score 0 '3 5 3 3      '$ Flowsheet Row ED from 05/10/2022 in [facility] Counselor from 09/23/2021 in [facility] Counselor from 04/01/2021 in [place] No Risk No Risk No Risk        Musculoskeletal  Strength & Muscle Tone: within normal limits Gait & Station: normal Patient leans: N/A  Psychiatric Specialty Exam  Presentation General Appearance: Appropriate for Environment  Eye Contact:Fair  Speech:Clear and Coherent  Speech Volume:Normal  Handedness:-- (not assessed)   Mood and Affect  Mood:Euthymic  Affect:Congruent   Thought Process  Thought Processes:Coherent; Linear  Descriptions of Associations:Intact  Orientation:Full (  Time, Place and Person)  Thought Content:Logical    Hallucinations:Hallucinations: None  Ideas of Reference:None  Suicidal Thoughts:Suicidal Thoughts: No  Homicidal Thoughts:Homicidal Thoughts: No   Sensorium  Memory:Immediate Fair; Recent Fair; Remote Fair  Judgment:Fair  Insight:Fair   Executive Functions  Concentration:Fair  Attention Span:Fair  [place]   Psychomotor Activity  Psychomotor Activity:Psychomotor Activity: Normal   Assets  Assets:Communication Skills; Resilience   Sleep  Sleep:Sleep: Fair   Nutritional Assessment (For OBS  and FBC admissions only) Has the patient had a weight loss or gain of 10 pounds or more in the last 3 months?: No Has the patient had a decrease in food intake/or appetite?: Yes Does the patient have dental problems?: No Does the patient have eating habits or behaviors that may be indicators of an eating disorder including binging or inducing vomiting?: No Has the patient recently lost weight without trying?: 0 Has the patient been eating poorly because of a decreased appetite?: 0 Malnutrition Screening Tool Score: 0    Physical Exam Constitutional:      Appearance: the patient is not toxic-appearing.  Pulmonary:     Effort: Pulmonary effort is normal.  Neurological:     General: No focal deficit present.     Mental Status: the patient is alert and oriented to person, place, and time.   Review of Systems  Respiratory:  Negative for shortness of breath.   Cardiovascular:  Negative for chest pain.  Gastrointestinal:  Negative for abdominal pain, constipation, diarrhea, nausea and vomiting.  Neurological:  Negative for headaches.    BP (!) 134/98   Pulse 78   Temp 98 F (36.7 C) (Oral)   Resp 20   SpO2 99%   Assessment and Plan:  Status: Voluntary, may be discharged AMA should the patient request, given lack of documented self-harm behavior and no apparent suicidal thoughts   Alcohol use disorder, history of moderate withdrawal symptoms - Ativan taper with CIWA and as needed Ativan   Restarted home medications, different dosages been as documented in the medical record, given the patient's lack of adherence - Propranolol 20 mg twice daily - Seroquel 50 mg nightly - Paxil 40 mg daily - Lisinopril 20 mg daily   Medical: Routine labs: Notable for slight elevation in LFTs, UDS positive for marijuana    Corky Sox, MD 05/14/2022 3:39 PM

## 2022-05-14 NOTE — ED Notes (Signed)
Pt is awake and alert. He has been visible in milieu and ate breakfast. Interacting appropriate with peers and maintaining appropriate  boundaries.  Pt has flat affect but good eye contact.  Mood and affect appear congruent.  Pt denies SI, HI or AVH.  Reports last drink of ETOH was Sunday .  Denies Withdrawal s/s at this time.  Reports that he is planing on going to treatment at " [facility] or [facility]..If I can afford it".   No further distress noted.  Will cont to monitor for safety.

## 2022-05-14 NOTE — ED Notes (Signed)
Patient is sleeping. Respirations equal and unlabored, skin warm and dry, NAD. No change in assessment or acuity. Routine safety checks conducted according to facility protocol. Will continue to monitor for safety.   

## 2022-05-14 NOTE — ED Notes (Signed)
Pt attending AA 

## 2022-05-14 NOTE — Group Note (Signed)
Group Topic: Relapse and Recovery  Group Date: 05/14/2022 Start Time: 1000 End Time: [place] Facilitators: Hetty Blend, RN  Department: [facility]  Number of Participants: 5  Group Focus: clarity of thought, communication, family, and relapse prevention Treatment Modality:  Behavior Modification Therapy, Cognitive Behavioral Therapy, and Individual Therapy Interventions utilized were group exercise, mental fitness, patient education, and support Purpose: enhance coping skills, express feelings, improve communication skills, regain self-worth, reinforce self-care, relapse prevention strategies, and trigger / craving management  Name: [name] Date of Birth: 18-Jan-1981  MR: [id number]    Level of Participation: active Quality of Participation: attentive and cooperative Interactions with others: gave feedback Mood/Affect: appropriate Triggers (if applicable): n/a Cognition: coherent/clear Progress: Moderate Response: n/a Plan: follow-up needed  Patients Problems:  Patient Active Problem List   Diagnosis Date Noted   Alcohol use disorder 05/10/2022   GAD (generalized anxiety disorder) 07/07/2021   Agoraphobia 07/07/2021   Bipolar 1 disorder, mixed, moderate ([place]) 05/13/2021   Substance induced mood disorder ([place]) 04/01/2021   PTSD (post-traumatic stress disorder) 04/01/2021   Alcohol abuse with alcohol-induced mood disorder ([place]) 01/26/2017   Intermittent explosive disorder 01/26/2017

## 2022-05-14 NOTE — ED Notes (Signed)
Pt is in the dayroom watching TV with peers. Pt denies SI/HI/AVH. No acute distress noted. Will continue to monitor for safety. 

## 2022-05-15 DIAGNOSIS — F319 Bipolar disorder, unspecified: Secondary | ICD-10-CM | POA: Diagnosis not present

## 2022-05-15 DIAGNOSIS — F411 Generalized anxiety disorder: Secondary | ICD-10-CM | POA: Diagnosis not present

## 2022-05-15 DIAGNOSIS — F4310 Post-traumatic stress disorder, unspecified: Secondary | ICD-10-CM | POA: Diagnosis not present

## 2022-05-15 DIAGNOSIS — F1090 Alcohol use, unspecified, uncomplicated: Secondary | ICD-10-CM | POA: Diagnosis not present

## 2022-05-15 DIAGNOSIS — I10 Essential (primary) hypertension: Secondary | ICD-10-CM | POA: Diagnosis not present

## 2022-05-15 DIAGNOSIS — R000 Tachycardia, unspecified: Secondary | ICD-10-CM | POA: Diagnosis not present

## 2022-05-15 DIAGNOSIS — F17210 Nicotine dependence, cigarettes, uncomplicated: Secondary | ICD-10-CM | POA: Diagnosis not present

## 2022-05-15 DIAGNOSIS — F429 Obsessive-compulsive disorder, unspecified: Secondary | ICD-10-CM | POA: Diagnosis not present

## 2022-05-15 DIAGNOSIS — Z1152 Encounter for screening for COVID-19: Secondary | ICD-10-CM | POA: Diagnosis not present

## 2022-05-15 MED ORDER — NALTREXONE HCL 50 MG PO TABS
25.0000 mg | ORAL_TABLET | Freq: Every day | ORAL | Status: DC
  Administered 2022-05-15 – 2022-05-16 (×2): 25 mg via ORAL
  Filled 2022-05-15 (×2): qty 1

## 2022-05-15 NOTE — ED Provider Notes (Signed)
Behavioral Health Progress Note  Date and Time: 05/15/2022 2:17 PM Name: [name] MRN:  [id number]  Subjective:  [name] is a 42 yo patient with a history of alcohol use disorder, GAD, OCD, and PTSD, diagnoses made by his outpatient psychiatric provider.  The patient has a history of psychiatric admission in 2018, in which his girlfriend called the police because she was worried that he was suicidal.  No documented previous self-harm behavior or suicide attempts.   The patient presented on 3/4 with his mother for assessment at the behavioral health urgent care.  He was referred for consideration of facility based crisis admission by his outpatient psychiatric provider.  The patient reports that alcohol has caused significant problems for him throughout his life.  He reports a DUI 6 years ago, issues with anger when intoxicated, problems showing up to work, and interpersonal dysfunction with romantic partners and his family.  He states that he desires to quit and says "I am a slave to it".  He reports that he normally experiences some alcohol withdrawal symptoms such as "cranky and shaky".  He denies experiencing seizures or delirium tremens.  He reports that he drinks approximately 15 to 1812 ounce beers per day.  He states that he has never done detox before.  He is uncertain what he wants after detox, but he says he may be interested in residential rehab.  His mother states that she would like for [place] to investigate self-pay options in addition to [facility] residential.  Objectively, today patient appears to be doing well, socializing with others and preferring to stay in the day room. Patient reports that he is overall doing well endorsing that his mood is "ok." Patient reports that he is eating and sleeping well. He reports that he has not contacted his parents today and does not have a plan for rehab. Provider request that he call his parents to get a plan. Patient did endorse that he is having very  strong cravings for Etoh and reports that if her were to walk out on his own today he would go to the liquor store. Patient and provider discussed naltrexone. Patient endorsed he was interested in the medication. Patient does not endorse any severe anxiety at this time. He denies SI, HI, and AVH as well.   Diagnosis:  Final diagnoses:  Alcohol use disorder    Total Time spent with patient: 20 minutes Past Psychiatric History: as above Past Medical History: as above Family History: none Family Psychiatric  History: none Social History: as above and per H and P   Additional Social History:  See H and P    Additional Social History:    Pain Medications: See MAR Prescriptions: See MAR Over the Counter: See MAR History of alcohol / drug use?: Yes Longest period of sobriety (when/how long): unknown Withdrawal Symptoms: Irritability Name of Substance 1: Alcohol 1 - Age of First Use: 15 1 - Amount (size/oz): 15-18 beer per day 1 - Frequency: daily 1 - Duration: ongoing 1 - Last Use / Amount: 05/10/2022 1 - Method of Aquiring: oral 1- Route of Use: oral                  Sleep: Good  Appetite:  Good  Current Medications:  Current Facility-Administered Medications  Medication Dose Route Frequency Provider Last Rate Last Admin   acetaminophen (TYLENOL) tablet 650 mg  650 mg Oral Q6H PRN Corky Sox, MD       alum &  mag hydroxide-simeth (MAALOX/MYLANTA) 200-200-20 MG/5ML suspension 30 mL  30 mL Oral Q4H PRN Corky Sox, MD       hydrOXYzine (ATARAX) tablet 25 mg  25 mg Oral TID PRN Corky Sox, MD   25 mg at 05/14/22 2129   lisinopril (ZESTRIL) tablet 20 mg  20 mg Oral Daily Corky Sox, MD   20 mg at 05/15/22 1020   magnesium hydroxide (MILK OF MAGNESIA) suspension 30 mL  30 mL Oral Daily PRN Corky Sox, MD       multivitamin with minerals tablet 1 tablet  1 tablet Oral Daily Corky Sox, MD   1 tablet at 05/15/22 1019   naltrexone (DEPADE) tablet 25  mg  25 mg Oral Daily Damita Dunnings B, MD       nicotine (NICODERM CQ - dosed in mg/24 hours) patch 21 mg  21 mg Transdermal Q0600 Corky Sox, MD   21 mg at 05/15/22 0534   nicotine polacrilex (NICORETTE) gum 2 mg  2 mg Oral PRN Corky Sox, MD       PARoxetine (PAXIL) tablet 40 mg  40 mg Oral Daily Corky Sox, MD   40 mg at 05/15/22 1020   propranolol (INDERAL) tablet 20 mg  20 mg Oral BID Corky Sox, MD   20 mg at 05/15/22 1019   QUEtiapine (SEROQUEL) tablet 50 mg  50 mg Oral QHS Corky Sox, MD   50 mg at 05/14/22 2129   thiamine (VITAMIN B1) tablet 100 mg  100 mg Oral Daily Corky Sox, MD   100 mg at 05/15/22 1020   Current Outpatient Medications  Medication Sig Dispense Refill   lisinopril (ZESTRIL) 20 MG tablet Take 1 tablet (20 mg total) by mouth daily. 30 tablet 1   Multiple Vitamins-Minerals (ADULT GUMMY PO) Take 1 tablet by mouth daily.     PARoxetine (PAXIL) 40 MG tablet Take 1 tablet (40 mg total) by mouth daily. 30 tablet 1   propranolol (INDERAL) 40 MG tablet Take 1 tablet (40 mg total) by mouth 2 (two) times daily. 60 tablet 1   QUEtiapine (SEROQUEL) 200 MG tablet Take 1 tablet (200 mg total) by mouth at bedtime. 30 tablet 1    Labs  Lab Results:  Admission on 05/10/2022  Component Date Value Ref Range Status   SARS Coronavirus 2 by RT PCR 05/10/2022 NEGATIVE  NEGATIVE Final   Influenza A by PCR 05/10/2022 NEGATIVE  NEGATIVE Final   Influenza B by PCR 05/10/2022 NEGATIVE  NEGATIVE Final   Comment: (NOTE) The Xpert Xpress SARS-CoV-2/FLU/RSV plus assay is intended as an aid in the diagnosis of influenza from Nasopharyngeal swab specimens and should not be used as a sole basis for treatment. Nasal washings and aspirates are unacceptable for Xpert Xpress SARS-CoV-2/FLU/RSV testing.  Fact Sheet for Patients: EntrepreneurPulse.com.au  Fact Sheet for Healthcare Providers: IncredibleEmployment.be  This test is  not yet approved or cleared by the [place] FDA and has been authorized for detection and/or diagnosis of SARS-CoV-2 by FDA under an Emergency Use Authorization (EUA). This EUA will remain in effect (meaning this test can be used) for the duration of the COVID-19 declaration under Section 564(b)(1) of the Act, 21 U.S.C. section 360bbb-3(b)(1), unless the authorization is terminated or revoked.     Resp Syncytial Virus by PCR 05/10/2022 NEGATIVE  NEGATIVE Final   Comment: (NOTE) Fact Sheet for Patients: EntrepreneurPulse.com.au  Fact Sheet for Healthcare Providers: IncredibleEmployment.be  This test is not yet approved or cleared by the [place] FDA  and has been authorized for detection and/or diagnosis of SARS-CoV-2 by FDA under an Emergency Use Authorization (EUA). This EUA will remain in effect (meaning this test can be used) for the duration of the COVID-19 declaration under Section 564(b)(1) of the Act, 21 U.S.C. section 360bbb-3(b)(1), unless the authorization is terminated or revoked.  Performed at [facility], [place] [address], [place], [place] [address]    WBC 05/10/2022 6.5  4.0 - 10.5 K/uL Final   RBC 05/10/2022 4.75  4.22 - 5.81 MIL/uL Final   Hemoglobin 05/10/2022 15.9  13.0 - 17.0 g/dL Final   HCT 05/10/2022 44.5  39.0 - 52.0 % Final   MCV 05/10/2022 93.7  80.0 - 100.0 fL Final   MCH 05/10/2022 33.5  26.0 - 34.0 pg Final   MCHC 05/10/2022 35.7  30.0 - 36.0 g/dL Final   RDW 05/10/2022 12.8  11.5 - 15.5 % Final   Platelets 05/10/2022 245  150 - 400 K/uL Final   nRBC 05/10/2022 0.0  0.0 - 0.2 % Final   Neutrophils Relative % 05/10/2022 51  % Final   Neutro Abs 05/10/2022 3.3  1.7 - 7.7 K/uL Final   Lymphocytes Relative 05/10/2022 33  % Final   Lymphs Abs 05/10/2022 2.2  0.7 - 4.0 K/uL Final   Monocytes Relative 05/10/2022 12  % Final   Monocytes Absolute 05/10/2022 0.8  0.1 - 1.0 K/uL Final   Eosinophils Relative  05/10/2022 2  % Final   Eosinophils Absolute 05/10/2022 0.1  0.0 - 0.5 K/uL Final   Basophils Relative 05/10/2022 1  % Final   Basophils Absolute 05/10/2022 0.1  0.0 - 0.1 K/uL Final   Immature Granulocytes 05/10/2022 1  % Final   Abs Immature Granulocytes 05/10/2022 0.04  0.00 - 0.07 K/uL Final   Performed at [facility], [place] [address], [place], [place] [address]   Sodium 05/10/2022 137  135 - 145 mmol/L Final   Potassium 05/10/2022 3.7  3.5 - 5.1 mmol/L Final   Chloride 05/10/2022 102  98 - 111 mmol/L Final   CO2 05/10/2022 21 (L)  22 - 32 mmol/L Final   Glucose, Bld 05/10/2022 100 (H)  70 - 99 mg/dL Final   Glucose reference range applies only to samples taken after fasting for at least 8 hours.   BUN 05/10/2022 5 (L)  6 - 20 mg/dL Final   Creatinine, Ser 05/10/2022 0.67  0.61 - 1.24 mg/dL Final   Calcium 05/10/2022 9.0  8.9 - 10.3 mg/dL Final   Total Protein 05/10/2022 8.3 (H)  6.5 - 8.1 g/dL Final   Albumin 05/10/2022 4.2  3.5 - 5.0 g/dL Final   AST 05/10/2022 143 (H)  15 - 41 U/L Final   ALT 05/10/2022 163 (H)  0 - 44 U/L Final   Alkaline Phosphatase 05/10/2022 61  38 - 126 U/L Final   Total Bilirubin 05/10/2022 0.6  0.3 - 1.2 mg/dL Final   GFR, Estimated 05/10/2022 >60  >60 mL/min Final   Comment: (NOTE) Calculated using the CKD-EPI Creatinine Equation (2021)    Anion gap 05/10/2022 14  5 - 15 Final   Performed at [facility] [address] [place], [place] [address]   Hgb A1c MFr Bld 05/10/2022 5.8 (H)  4.8 - 5.6 % Final   Comment: (NOTE)         Prediabetes: 5.7 - 6.4         Diabetes: >6.4  Glycemic control for adults with diabetes: <7.0    Mean Plasma Glucose 05/10/2022 120  mg/dL Final   Comment: (NOTE) Performed At: [facility] [place], [place] [id number] Rush Farmer MD [id number]    Cholesterol 05/10/2022 248 (H)  0 - 200 mg/dL Final   Triglycerides 05/10/2022 69  <150 mg/dL Final   HDL 05/10/2022 110  >40  mg/dL Final   Total CHOL/HDL Ratio 05/10/2022 2.3  RATIO Final   VLDL 05/10/2022 14  0 - 40 mg/dL Final   LDL Cholesterol 05/10/2022 124 (H)  0 - 99 mg/dL Final   Comment:        Total Cholesterol/HDL:CHD Risk Coronary Heart Disease Risk Table                     Men   Women  1/2 Average Risk   3.4   3.3  Average Risk       5.0   4.4  2 X Average Risk   9.6   7.1  3 X Average Risk  23.4   11.0        Use the calculated Patient Ratio above and the CHD Risk Table to determine the patient's CHD Risk.        ATP III CLASSIFICATION (LDL):  <100     mg/dL   Optimal  100-129  mg/dL   Near or Above                    Optimal  130-159  mg/dL   Borderline  160-189  mg/dL   High  >190     mg/dL   Very High Performed at [facility] [address], [place], [place] [address]    TSH 05/10/2022 0.472  0.350 - 4.500 uIU/mL Final   Comment: Performed by a 3rd Generation assay with a functional sensitivity of <=0.01 uIU/mL. Performed at [facility], [place] [address] [place], [place] [address]    POC Amphetamine UR 05/10/2022 None Detected  NONE DETECTED (Cut Off Level 1000 ng/mL) Final   POC Secobarbital (BAR) 05/10/2022 None Detected  NONE DETECTED (Cut Off Level 300 ng/mL) Final   POC Buprenorphine (BUP) 05/10/2022 None Detected  NONE DETECTED (Cut Off Level 10 ng/mL) Final   POC Oxazepam (BZO) 05/10/2022 None Detected  NONE DETECTED (Cut Off Level 300 ng/mL) Final   POC Cocaine UR 05/10/2022 None Detected  NONE DETECTED (Cut Off Level 300 ng/mL) Final   POC Methamphetamine UR 05/10/2022 None Detected  NONE DETECTED (Cut Off Level 1000 ng/mL) Final   POC Morphine 05/10/2022 None Detected  NONE DETECTED (Cut Off Level 300 ng/mL) Final   POC Methadone UR 05/10/2022 None Detected  NONE DETECTED (Cut Off Level 300 ng/mL) Final   POC Oxycodone UR 05/10/2022 None Detected  NONE DETECTED (Cut Off Level 100 ng/mL) Final   POC Marijuana UR 05/10/2022 Positive (A)  NONE DETECTED (Cut Off  Level 50 ng/mL) Final   SARSCOV2ONAVIRUS 2 AG 05/10/2022 NEGATIVE  NEGATIVE Final   Comment: (NOTE) SARS-CoV-2 antigen NOT DETECTED.   Negative results are presumptive.  Negative results do not preclude SARS-CoV-2 infection and should not be used as the sole basis for treatment or other patient management decisions, including infection  control decisions, particularly in the presence of clinical signs and  symptoms consistent with COVID-19, or in those who have been in contact with the virus.  Negative results must be combined with clinical observations, patient  history, and epidemiological information. The expected result is Negative.  Fact Sheet for Patients: HandmadeRecipes.com.cy  Fact Sheet for Healthcare Providers: FuneralLife.at  This test is not yet approved or cleared by the [place] FDA and  has been authorized for detection and/or diagnosis of SARS-CoV-2 by FDA under an Emergency Use Authorization (EUA).  This EUA will remain in effect (meaning this test can be used) for the duration of  the COV                          ID-19 declaration under Section 564(b)(1) of the Act, 21 U.S.C. section 360bbb-3(b)(1), unless the authorization is terminated or revoked sooner.     Sodium 05/13/2022 134 (L)  135 - 145 mmol/L Final   Potassium 05/13/2022 5.2 (H)  3.5 - 5.1 mmol/L Final   Chloride 05/13/2022 100  98 - 111 mmol/L Final   CO2 05/13/2022 23  22 - 32 mmol/L Final   Glucose, Bld 05/13/2022 105 (H)  70 - 99 mg/dL Final   Glucose reference range applies only to samples taken after fasting for at least 8 hours.   BUN 05/13/2022 16  6 - 20 mg/dL Final   Creatinine, Ser 05/13/2022 0.94  0.61 - 1.24 mg/dL Final   Calcium 05/13/2022 9.9  8.9 - 10.3 mg/dL Final   Total Protein 05/13/2022 7.3  6.5 - 8.1 g/dL Final   Albumin 05/13/2022 3.9  3.5 - 5.0 g/dL Final   AST 05/13/2022 119 (H)  15 - 41 U/L Final   ALT 05/13/2022 145 (H)   0 - 44 U/L Final   Alkaline Phosphatase 05/13/2022 59  38 - 126 U/L Final   Total Bilirubin 05/13/2022 0.6  0.3 - 1.2 mg/dL Final   GFR, Estimated 05/13/2022 >60  >60 mL/min Final   Comment: (NOTE) Calculated using the CKD-EPI Creatinine Equation (2021)    Anion gap 05/13/2022 11  5 - 15 Final   Performed at [facility] [address], [place], [place] [address]  Orders Only on 12/29/2021  Component Date Value Ref Range Status   WBC 12/29/2021 5.8  3.4 - 10.8 x10E3/uL Final   RBC 12/29/2021 4.75  4.14 - 5.80 x10E6/uL Final   Hemoglobin 12/29/2021 15.4  13.0 - 17.7 g/dL Final   Hematocrit 12/29/2021 45.1  37.5 - 51.0 % Final   MCV 12/29/2021 95  79 - 97 fL Final   MCH 12/29/2021 32.4  26.6 - 33.0 pg Final   MCHC 12/29/2021 34.1  31.5 - 35.7 g/dL Final   RDW 12/29/2021 12.9  11.6 - 15.4 % Final   Platelets 12/29/2021 230  150 - 450 x10E3/uL Final   Neutrophils 12/29/2021 54  Not Estab. % Final   Lymphs 12/29/2021 26  Not Estab. % Final   Monocytes 12/29/2021 16  Not Estab. % Final   Eos 12/29/2021 3  Not Estab. % Final   Basos 12/29/2021 1  Not Estab. % Final   Neutrophils Absolute 12/29/2021 3.1  1.4 - 7.0 x10E3/uL Final   Lymphocytes Absolute 12/29/2021 1.5  0.7 - 3.1 x10E3/uL Final   Monocytes Absolute 12/29/2021 0.9  0.1 - 0.9 x10E3/uL Final   EOS (ABSOLUTE) 12/29/2021 0.2  0.0 - 0.4 x10E3/uL Final   Basophils Absolute 12/29/2021 0.1  0.0 - 0.2 x10E3/uL Final   Immature Granulocytes 12/29/2021 0  Not Estab. % Final   Immature Grans (Abs) 12/29/2021 0.0  0.0 - 0.1 x10E3/uL Final   Glucose  12/29/2021 105 (H)  70 - 99 mg/dL Final   BUN 12/29/2021 7  6 - 20 mg/dL Final   Creatinine, Ser 12/29/2021 0.73 (L)  0.76 - 1.27 mg/dL Final   eGFR 12/29/2021 119  >59 mL/min/1.73 Final   BUN/Creatinine Ratio 12/29/2021 10  9 - 20 Final   Sodium 12/29/2021 139  134 - 144 mmol/L Final   Potassium 12/29/2021 4.0  3.5 - 5.2 mmol/L Final   Chloride 12/29/2021 103  96 - 106 mmol/L Final    CO2 12/29/2021 22  20 - 29 mmol/L Final   Calcium 12/29/2021 9.0  8.7 - 10.2 mg/dL Final   Total Protein 12/29/2021 6.9  6.0 - 8.5 g/dL Final   Albumin 12/29/2021 4.2  4.1 - 5.1 g/dL Final   Globulin, Total 12/29/2021 2.7  1.5 - 4.5 g/dL Final   Albumin/Globulin Ratio 12/29/2021 1.6  1.2 - 2.2 Final   Bilirubin Total 12/29/2021 <0.2  0.0 - 1.2 mg/dL Final   Alkaline Phosphatase 12/29/2021 74  44 - 121 IU/L Final   AST 12/29/2021 87 (H)  0 - 40 IU/L Final   ALT 12/29/2021 76 (H)  0 - 44 IU/L Final   Cholesterol, Total 12/29/2021 169  100 - 199 mg/dL Final   Triglycerides 12/29/2021 61  0 - 149 mg/dL Final   HDL 12/29/2021 77  >39 mg/dL Final   VLDL Cholesterol Cal 12/29/2021 12  5 - 40 mg/dL Final   LDL Chol Calc (NIH) 12/29/2021 80  0 - 99 mg/dL Final   Chol/HDL Ratio 12/29/2021 2.2  0.0 - 5.0 ratio Final   Comment:                                   T. Chol/HDL Ratio                                             Men  Women                               1/2 Avg.Risk  3.4    3.3                                   Avg.Risk  5.0    4.4                                2X Avg.Risk  9.6    7.1                                3X Avg.Risk 23.4   11.0    Hgb A1c MFr Bld 12/29/2021 5.7 (H)  4.8 - 5.6 % Final   Comment:          Prediabetes: 5.7 - 6.4          Diabetes: >6.4          Glycemic control for adults with diabetes: <7.0    Est. average glucose Bld gHb Est-m* 12/29/2021 117  mg/dL Final   specimen status report 12/29/2021 Comment   Final  Comment: Please note Please note The date and/or time of collection was not indicated on the requisition as required by state and federal law.  The date of receipt of the specimen was used as the collection date if not supplied.     Blood Alcohol level:  Lab Results  Component Value Date   ETH 281 (H) 01/25/2017   ETH (H) 07/29/2008    186        LOWEST DETECTABLE LIMIT FOR SERUM ALCOHOL IS 5 mg/dL FOR MEDICAL PURPOSES ONLY     Metabolic Disorder Labs: Lab Results  Component Value Date   HGBA1C 5.8 (H) 05/10/2022   MPG 120 05/10/2022   No results found for: "PROLACTIN" Lab Results  Component Value Date   CHOL 248 (H) 05/10/2022   TRIG 69 05/10/2022   HDL 110 05/10/2022   CHOLHDL 2.3 05/10/2022   VLDL 14 05/10/2022   LDLCALC 124 (H) 05/10/2022   LDLCALC 80 12/29/2021    Therapeutic Lab Levels: No results found for: "LITHIUM" No results found for: "VALPROATE" No results found for: "CBMZ"  Physical Findings   AIMS    Flowsheet Row Admission (Discharged) from 01/26/2017 in [place] [facility]  AIMS Total Score 0      AUDIT    Flowsheet Row Counselor from 04/01/2021 in [facility] Admission (Discharged) from 01/26/2017 in [place] [facility]  Alcohol Use Disorder Identification Test Final Score (AUDIT) 13 9      GAD-7    Flowsheet Row Counselor from 11/10/2021 in [facility] Counselor from 09/23/2021 in [facility] Counselor from 09/01/2021 in [facility] Counselor from 08/04/2021 in [facility] Counselor from 07/07/2021 in [facility]  Total GAD-7 Score '8 5 9 6 18      '$ PHQ2-9    [place] ED from 05/10/2022 in [facility] Counselor from 11/10/2021 in [facility] Counselor from 09/23/2021 in [facility] Counselor from 09/01/2021 in [facility] Counselor from 07/07/2021 in [facility]  PHQ-2 Total Score 1 0 0 0 1  PHQ-9 Total Score '11 3 5 3 3      '$ Flowsheet Row ED from 05/10/2022 in [facility] Counselor from 09/23/2021 in [facility] Counselor from 04/01/2021 in [place] No Risk No Risk No Risk        Musculoskeletal  Strength & Muscle Tone: within normal limits Gait & Station: normal Patient leans: N/A  Psychiatric Specialty Exam  Presentation  General Appearance: Casual; Appropriate for Environment  Eye Contact:Good  Speech:Clear and Coherent  Speech Volume:Normal  Handedness:No data recorded  Mood and Affect  Mood:Euthymic  Affect:Appropriate   Thought Process  Thought Processes:Coherent  Descriptions of Associations:Intact  Orientation:Full (Time, Place and Person)  Thought Content:Logical  Diagnosis of Schizophrenia or Schizoaffective disorder in past: No    Hallucinations:Hallucinations: None  Ideas of Reference:None  Suicidal Thoughts:Suicidal Thoughts: No  Homicidal Thoughts:Homicidal Thoughts: No   Sensorium  Memory:Immediate Good  Judgment:Fair  Insight:Shallow   Executive Functions  Concentration:Fair  Attention Span:Fair  Recall:No data recorded Fund of Knowledge:Good  Language:Good   Psychomotor Activity  Psychomotor Activity:Psychomotor Activity: Normal   Assets  Assets:Communication Skills; Resilience; Social Support   Sleep  Sleep:Sleep: Good   No  data recorded  Physical Exam  Physical Exam Constitutional:      Appearance: Normal appearance.  HENT:     Head: Normocephalic and atraumatic.  Pulmonary:     Effort: Pulmonary effort is normal.  Neurological:     Mental Status: He is alert and oriented to person, place, and time.    Review of Systems  Psychiatric/Behavioral:  Negative for depression, hallucinations and suicidal ideas. The patient does not have insomnia.    Blood pressure 118/81, pulse 77, temperature 98.7 F (37.1 C), resp. rate 18, SpO2 100 %. There is no height or weight on file to calculate BMI.  Treatment Plan Summary: Daily contact with patient to assess and evaluate symptoms and progress in treatment and  Medication management  Patient appears to be stalling in his rehab plans. Patient did admit to having cravings, and will start medication, but made it clear to patient that he needs to plan for rehab or post discharge. Discussed with patient possible side effects of starting naltrexone as well as the importance of not drinking over the medication.    Alcohol use disorder, history of moderate withdrawal symptoms - Ativan taper with CIWA completed 05/14/22 -- Start Naltrexone '25mg'$    Restarted home medications, different dosages been as documented in the medical record, given the patient's lack of adherence - Propranolol 20 mg twice daily - Seroquel 50 mg nightly - Paxil 40 mg daily - Lisinopril 20 mg daily   Medical: Routine labs: Notable for slight elevation in LFTs but trending down, UDS positive for marijuana    PGY-3 Freida Busman, MD 05/15/2022 2:17 PM

## 2022-05-15 NOTE — Group Note (Signed)
Group Topic: Recovery Basics  Group Date: 05/15/2022 Start Time: 0900 End Time: 1000 Facilitators: Parks Neptune, NT  Department: [facility]  Number of Participants: 8  Group Focus: daily focus and substance abuse education Treatment Modality:  Psychoeducation Interventions utilized were clarification, group exercise, and support Purpose: enhance coping skills and increase insight  Name: [name] Date of Birth: 03-03-1981  MR: [id number]    Level of Participation: moderate Quality of Participation: engaged Interactions with others: cooperative Mood/Affect: appropriate Triggers (if applicable): n/a Cognition: coherent/clear Progress: Gaining insight Response: positive Plan: follow-up needed  Patients Problems:  Patient Active Problem List   Diagnosis Date Noted   Alcohol use disorder 05/10/2022   GAD (generalized anxiety disorder) 07/07/2021   Agoraphobia 07/07/2021   Bipolar 1 disorder, mixed, moderate ([place]) 05/13/2021   Substance induced mood disorder ([place]) 04/01/2021   PTSD (post-traumatic stress disorder) 04/01/2021   Alcohol abuse with alcohol-induced mood disorder ([place]) 01/26/2017   Intermittent explosive disorder 01/26/2017

## 2022-05-15 NOTE — ED Notes (Signed)
Patient is sleeping. Respirations equal and unlabored, skin warm and dry, NAD. No change in assessment or acuity. Routine safety checks conducted according to facility protocol. Will continue to monitor for safety.   

## 2022-05-15 NOTE — ED Notes (Signed)
Pt a/o, calm, pleasant. In dayroom but does not socialize  much with peers. He denies SI/HI/AVH. Denies s/s of withdrawal but showed this nurse that he has chewed off his fingernails since admission.He states they do not hurt and no s/s of infection noted. Skin w/d. No noted distress. Will continue to monitor for safety

## 2022-05-15 NOTE — ED Notes (Signed)
Patient is currently sitting in the chairs on the mileu speaking with the nurse no distress noted, will continue to monitor patient for safety

## 2022-05-15 NOTE — ED Notes (Signed)
Patient calm and cooperative with care.  He has attended all groups and appears motivated for treatment.  Patient is without distress or complaint.  No withdrawal noted.  He is social with peers and makes needs known.  Will monitor and provide support as needed.

## 2022-05-16 DIAGNOSIS — I10 Essential (primary) hypertension: Secondary | ICD-10-CM | POA: Diagnosis not present

## 2022-05-16 DIAGNOSIS — F4310 Post-traumatic stress disorder, unspecified: Secondary | ICD-10-CM | POA: Diagnosis not present

## 2022-05-16 DIAGNOSIS — R000 Tachycardia, unspecified: Secondary | ICD-10-CM | POA: Diagnosis not present

## 2022-05-16 DIAGNOSIS — Z1152 Encounter for screening for COVID-19: Secondary | ICD-10-CM | POA: Diagnosis not present

## 2022-05-16 DIAGNOSIS — F1090 Alcohol use, unspecified, uncomplicated: Secondary | ICD-10-CM | POA: Diagnosis not present

## 2022-05-16 DIAGNOSIS — F17210 Nicotine dependence, cigarettes, uncomplicated: Secondary | ICD-10-CM | POA: Diagnosis not present

## 2022-05-16 DIAGNOSIS — F319 Bipolar disorder, unspecified: Secondary | ICD-10-CM | POA: Diagnosis not present

## 2022-05-16 DIAGNOSIS — F411 Generalized anxiety disorder: Secondary | ICD-10-CM | POA: Diagnosis not present

## 2022-05-16 DIAGNOSIS — F429 Obsessive-compulsive disorder, unspecified: Secondary | ICD-10-CM | POA: Diagnosis not present

## 2022-05-16 MED ORDER — PROPRANOLOL HCL 20 MG PO TABS
30.0000 mg | ORAL_TABLET | Freq: Two times a day (BID) | ORAL | Status: DC
  Administered 2022-05-16 – 2022-05-17 (×2): 30 mg via ORAL
  Filled 2022-05-16: qty 84
  Filled 2022-05-16: qty 3
  Filled 2022-05-16: qty 84
  Filled 2022-05-16: qty 3

## 2022-05-16 MED ORDER — NALTREXONE HCL 50 MG PO TABS
25.0000 mg | ORAL_TABLET | Freq: Once | ORAL | Status: AC
  Administered 2022-05-16: 25 mg via ORAL
  Filled 2022-05-16: qty 1

## 2022-05-16 MED ORDER — NALTREXONE HCL 50 MG PO TABS
50.0000 mg | ORAL_TABLET | Freq: Every day | ORAL | Status: DC
  Administered 2022-05-17: 50 mg via ORAL
  Filled 2022-05-16: qty 14
  Filled 2022-05-16: qty 1

## 2022-05-16 NOTE — ED Provider Notes (Signed)
Behavioral Health Progress Note  Date and Time: 05/16/2022 2:09 PM Name: [name] MRN:  [id number]  Subjective:  [name] is a 42 yo patient with a history of alcohol use disorder, GAD, OCD, and PTSD, diagnoses made by his outpatient psychiatric provider.  The patient has a history of psychiatric admission in 2018, in which his girlfriend called the police because she was worried that he was suicidal.  No documented previous self-harm behavior or suicide attempts.   The patient presented on 3/4 with his mother for assessment at the behavioral health urgent care.  He was referred for consideration of facility based crisis admission by his outpatient psychiatric provider.  The patient reports that alcohol has caused significant problems for him throughout his life.  He reports a DUI 6 years ago, issues with anger when intoxicated, problems showing up to work, and interpersonal dysfunction with romantic partners and his family.  He states that he desires to quit and says "I am a slave to it".  He reports that he normally experiences some alcohol withdrawal symptoms such as "cranky and shaky".  He denies experiencing seizures or delirium tremens.  He reports that he drinks approximately 15 to 1812 ounce beers per day.  He states that he has never done detox before.  He is uncertain what he wants after detox, but he says he may be interested in residential rehab.  His mother states that she would like for [place] to investigate self-pay options in addition to [facility] residential.   Overnight RN concerned about the patient picking at his fingernails. On assessment this AM, patient appears to have bitten all of his nails, but his thumbs have barely any nail and he is pulled quite a bit of skin off on both. Patient endorsed that these behaviors started approx 3 days ago. Patient reports that he has ben feeling on edge and keyed up as well.   Patient reports that he does have some anxiety and that he prefers to  stay in the day room and color all day because this is his coping mechanism and leads to him feeling relaxed. Patient reports he is not sure why he anxious and is not very concerned about the anxiety endorsing that he will just go back to color after assessment. Patient reports that he is not sure that the naltrexone is helpful, but denies any adverse side effects. Objectively when patient was asked about cravings he endorsement appeared to be less intense than yesterday, indicating that the cravings may be less intense already. Patient did endorse good sleep and denies SI,HI, and AVH.  Diagnosis:  Final diagnoses:  Alcohol use disorder    Total Time spent with patient: 20 minutes  Past Psychiatric History:  GAD, OCD, Insomnia, Chronic PTSD, EtOH Use Disorder, Bipolar Disorder, SAD, and ADHD, 1 Suicide Attempt (cut self with knife, teenager), past Psychiatric Hospitalizations (latest [facility] 2018).  Past Medical History: N/A Family History: Unknown at this time Family Psychiatric  History: Father- Cocaine Use No Known Diagnosis' or Suicides Social History: See H&P  Additional Social History:    Pain Medications: See MAR Prescriptions: See MAR Over the Counter: See MAR History of alcohol / drug use?: Yes Longest period of sobriety (when/how long): unknown Withdrawal Symptoms: Irritability Name of Substance 1: Alcohol 1 - Age of First Use: 15 1 - Amount (size/oz): 15-18 beer per day 1 - Frequency: daily 1 - Duration: ongoing 1 - Last Use / Amount: 05/10/2022 1 - Method of Aquiring: oral  1- Route of Use: oral                  Sleep: Good  Appetite:  Good  Current Medications:  Current Facility-Administered Medications  Medication Dose Route Frequency Provider Last Rate Last Admin   acetaminophen (TYLENOL) tablet 650 mg  650 mg Oral Q6H PRN Corky Sox, MD       alum & mag hydroxide-simeth (MAALOX/MYLANTA) 200-200-20 MG/5ML suspension 30 mL  30 mL Oral Q4H PRN Corky Sox, MD       hydrOXYzine (ATARAX) tablet 25 mg  25 mg Oral TID PRN Corky Sox, MD   25 mg at 05/15/22 2116   lisinopril (ZESTRIL) tablet 20 mg  20 mg Oral Daily Corky Sox, MD   20 mg at 05/16/22 [id number]   magnesium hydroxide (MILK OF MAGNESIA) suspension 30 mL  30 mL Oral Daily PRN Corky Sox, MD       multivitamin with minerals tablet 1 tablet  1 tablet Oral Daily Corky Sox, MD   1 tablet at 05/16/22 [id number]   naltrexone (DEPADE) tablet 25 mg  25 mg Oral Once Freida Busman, MD       [START ON 05/17/2022] naltrexone (DEPADE) tablet 50 mg  50 mg Oral Daily Irine Heminger, Joyce Gross B, MD       nicotine (NICODERM CQ - dosed in mg/24 hours) patch 21 mg  21 mg Transdermal Q0600 Corky Sox, MD   21 mg at 05/16/22 [id number]   nicotine polacrilex (NICORETTE) gum 2 mg  2 mg Oral PRN Corky Sox, MD       PARoxetine (PAXIL) tablet 40 mg  40 mg Oral Daily Corky Sox, MD   40 mg at 05/16/22 [id number]   propranolol (INDERAL) tablet 30 mg  30 mg Oral BID Damita Dunnings B, MD       QUEtiapine (SEROQUEL) tablet 50 mg  50 mg Oral QHS Corky Sox, MD   50 mg at 05/15/22 2116   thiamine (VITAMIN B1) tablet 100 mg  100 mg Oral Daily Corky Sox, MD   100 mg at 05/16/22 [id number]   Current Outpatient Medications  Medication Sig Dispense Refill   lisinopril (ZESTRIL) 20 MG tablet Take 1 tablet (20 mg total) by mouth daily. 30 tablet 1   Multiple Vitamins-Minerals (ADULT GUMMY PO) Take 1 tablet by mouth daily.     PARoxetine (PAXIL) 40 MG tablet Take 1 tablet (40 mg total) by mouth daily. 30 tablet 1   propranolol (INDERAL) 40 MG tablet Take 1 tablet (40 mg total) by mouth 2 (two) times daily. 60 tablet 1   QUEtiapine (SEROQUEL) 200 MG tablet Take 1 tablet (200 mg total) by mouth at bedtime. 30 tablet 1    Labs  Lab Results:  Admission on 05/10/2022  Component Date Value Ref Range Status   SARS Coronavirus 2 by RT PCR 05/10/2022 NEGATIVE  NEGATIVE Final   Influenza A by PCR 05/10/2022 NEGATIVE   NEGATIVE Final   Influenza B by PCR 05/10/2022 NEGATIVE  NEGATIVE Final   Comment: (NOTE) The Xpert Xpress SARS-CoV-2/FLU/RSV plus assay is intended as an aid in the diagnosis of influenza from Nasopharyngeal swab specimens and should not be used as a sole basis for treatment. Nasal washings and aspirates are unacceptable for Xpert Xpress SARS-CoV-2/FLU/RSV testing.  Fact Sheet for Patients: EntrepreneurPulse.com.au  Fact Sheet for Healthcare Providers: IncredibleEmployment.be  This test is not yet approved or cleared by the [place] FDA and has been authorized for detection and/or diagnosis  of SARS-CoV-2 by FDA under an Emergency Use Authorization (EUA). This EUA will remain in effect (meaning this test can be used) for the duration of the COVID-19 declaration under Section 564(b)(1) of the Act, 21 U.S.C. section 360bbb-3(b)(1), unless the authorization is terminated or revoked.     Resp Syncytial Virus by PCR 05/10/2022 NEGATIVE  NEGATIVE Final   Comment: (NOTE) Fact Sheet for Patients: EntrepreneurPulse.com.au  Fact Sheet for Healthcare Providers: IncredibleEmployment.be  This test is not yet approved or cleared by the [place] FDA and has been authorized for detection and/or diagnosis of SARS-CoV-2 by FDA under an Emergency Use Authorization (EUA). This EUA will remain in effect (meaning this test can be used) for the duration of the COVID-19 declaration under Section 564(b)(1) of the Act, 21 U.S.C. section 360bbb-3(b)(1), unless the authorization is terminated or revoked.  Performed at [facility], [place] [address], [place], [place] [address]    WBC 05/10/2022 6.5  4.0 - 10.5 K/uL Final   RBC 05/10/2022 4.75  4.22 - 5.81 MIL/uL Final   Hemoglobin 05/10/2022 15.9  13.0 - 17.0 g/dL Final   HCT 05/10/2022 44.5  39.0 - 52.0 % Final   MCV 05/10/2022 93.7  80.0 - 100.0 fL Final   MCH  05/10/2022 33.5  26.0 - 34.0 pg Final   MCHC 05/10/2022 35.7  30.0 - 36.0 g/dL Final   RDW 05/10/2022 12.8  11.5 - 15.5 % Final   Platelets 05/10/2022 245  150 - 400 K/uL Final   nRBC 05/10/2022 0.0  0.0 - 0.2 % Final   Neutrophils Relative % 05/10/2022 51  % Final   Neutro Abs 05/10/2022 3.3  1.7 - 7.7 K/uL Final   Lymphocytes Relative 05/10/2022 33  % Final   Lymphs Abs 05/10/2022 2.2  0.7 - 4.0 K/uL Final   Monocytes Relative 05/10/2022 12  % Final   Monocytes Absolute 05/10/2022 0.8  0.1 - 1.0 K/uL Final   Eosinophils Relative 05/10/2022 2  % Final   Eosinophils Absolute 05/10/2022 0.1  0.0 - 0.5 K/uL Final   Basophils Relative 05/10/2022 1  % Final   Basophils Absolute 05/10/2022 0.1  0.0 - 0.1 K/uL Final   Immature Granulocytes 05/10/2022 1  % Final   Abs Immature Granulocytes 05/10/2022 0.04  0.00 - 0.07 K/uL Final   Performed at [facility], [place] [address] [place], [place] [address]   Sodium 05/10/2022 137  135 - 145 mmol/L Final   Potassium 05/10/2022 3.7  3.5 - 5.1 mmol/L Final   Chloride 05/10/2022 102  98 - 111 mmol/L Final   CO2 05/10/2022 21 (L)  22 - 32 mmol/L Final   Glucose, Bld 05/10/2022 100 (H)  70 - 99 mg/dL Final   Glucose reference range applies only to samples taken after fasting for at least 8 hours.   BUN 05/10/2022 5 (L)  6 - 20 mg/dL Final   Creatinine, Ser 05/10/2022 0.67  0.61 - 1.24 mg/dL Final   Calcium 05/10/2022 9.0  8.9 - 10.3 mg/dL Final   Total Protein 05/10/2022 8.3 (H)  6.5 - 8.1 g/dL Final   Albumin 05/10/2022 4.2  3.5 - 5.0 g/dL Final   AST 05/10/2022 143 (H)  15 - 41 U/L Final   ALT 05/10/2022 163 (H)  0 - 44 U/L Final   Alkaline Phosphatase 05/10/2022 61  38 - 126 U/L Final   Total Bilirubin 05/10/2022 0.6  0.3 - 1.2 mg/dL Final   GFR, Estimated 05/10/2022 >  60  >60 mL/min Final   Comment: (NOTE) Calculated using the CKD-EPI Creatinine Equation (2021)    Anion gap 05/10/2022 14  5 - 15 Final   Performed at [place], [place] [address] [place], [place] [address]   Hgb A1c MFr Bld 05/10/2022 5.8 (H)  4.8 - 5.6 % Final   Comment: (NOTE)         Prediabetes: 5.7 - 6.4         Diabetes: >6.4         Glycemic control for adults with diabetes: <7.0    Mean Plasma Glucose 05/10/2022 120  mg/dL Final   Comment: (NOTE) Performed At: [facility] [place], [place] [id number] Rush Farmer MD [id number]    Cholesterol 05/10/2022 248 (H)  0 - 200 mg/dL Final   Triglycerides 05/10/2022 69  <150 mg/dL Final   HDL 05/10/2022 110  >40 mg/dL Final   Total CHOL/HDL Ratio 05/10/2022 2.3  RATIO Final   VLDL 05/10/2022 14  0 - 40 mg/dL Final   LDL Cholesterol 05/10/2022 124 (H)  0 - 99 mg/dL Final   Comment:        Total Cholesterol/HDL:CHD Risk Coronary Heart Disease Risk Table                     Men   Women  1/2 Average Risk   3.4   3.3  Average Risk       5.0   4.4  2 X Average Risk   9.6   7.1  3 X Average Risk  23.4   11.0        Use the calculated Patient Ratio above and the CHD Risk Table to determine the patient's CHD Risk.        ATP III CLASSIFICATION (LDL):  <100     mg/dL   Optimal  100-129  mg/dL   Near or Above                    Optimal  130-159  mg/dL   Borderline  160-189  mg/dL   High  >190     mg/dL   Very High Performed at [place] [address] [place], [place] [address]    TSH 05/10/2022 0.472  0.350 - 4.500 uIU/mL Final   Comment: Performed by a 3rd Generation assay with a functional sensitivity of <=0.01 uIU/mL. Performed at [facility], [place] [address] [place], [place] [address]    POC Amphetamine UR 05/10/2022 None Detected  NONE DETECTED (Cut Off Level 1000 ng/mL) Final   POC Secobarbital (BAR) 05/10/2022 None Detected  NONE DETECTED (Cut Off Level 300 ng/mL) Final   POC Buprenorphine (BUP) 05/10/2022 None Detected  NONE DETECTED (Cut Off Level 10 ng/mL) Final   POC Oxazepam (BZO) 05/10/2022 None Detected  NONE DETECTED (Cut Off  Level 300 ng/mL) Final   POC Cocaine UR 05/10/2022 None Detected  NONE DETECTED (Cut Off Level 300 ng/mL) Final   POC Methamphetamine UR 05/10/2022 None Detected  NONE DETECTED (Cut Off Level 1000 ng/mL) Final   POC Morphine 05/10/2022 None Detected  NONE DETECTED (Cut Off Level 300 ng/mL) Final   POC Methadone UR 05/10/2022 None Detected  NONE DETECTED (Cut Off Level 300 ng/mL) Final   POC Oxycodone UR 05/10/2022 None Detected  NONE DETECTED (Cut Off Level 100 ng/mL) Final   POC Marijuana UR 05/10/2022 Positive (A)  NONE DETECTED (Cut  Off Level 50 ng/mL) Final   SARSCOV2ONAVIRUS 2 AG 05/10/2022 NEGATIVE  NEGATIVE Final   Comment: (NOTE) SARS-CoV-2 antigen NOT DETECTED.   Negative results are presumptive.  Negative results do not preclude SARS-CoV-2 infection and should not be used as the sole basis for treatment or other patient management decisions, including infection  control decisions, particularly in the presence of clinical signs and  symptoms consistent with COVID-19, or in those who have been in contact with the virus.  Negative results must be combined with clinical observations, patient history, and epidemiological information. The expected result is Negative.  Fact Sheet for Patients: HandmadeRecipes.com.cy  Fact Sheet for Healthcare Providers: FuneralLife.at  This test is not yet approved or cleared by the [place] FDA and  has been authorized for detection and/or diagnosis of SARS-CoV-2 by FDA under an Emergency Use Authorization (EUA).  This EUA will remain in effect (meaning this test can be used) for the duration of  the COV                          ID-19 declaration under Section 564(b)(1) of the Act, 21 U.S.C. section 360bbb-3(b)(1), unless the authorization is terminated or revoked sooner.     Sodium 05/13/2022 134 (L)  135 - 145 mmol/L Final   Potassium 05/13/2022 5.2 (H)  3.5 - 5.1 mmol/L Final   Chloride  05/13/2022 100  98 - 111 mmol/L Final   CO2 05/13/2022 23  22 - 32 mmol/L Final   Glucose, Bld 05/13/2022 105 (H)  70 - 99 mg/dL Final   Glucose reference range applies only to samples taken after fasting for at least 8 hours.   BUN 05/13/2022 16  6 - 20 mg/dL Final   Creatinine, Ser 05/13/2022 0.94  0.61 - 1.24 mg/dL Final   Calcium 05/13/2022 9.9  8.9 - 10.3 mg/dL Final   Total Protein 05/13/2022 7.3  6.5 - 8.1 g/dL Final   Albumin 05/13/2022 3.9  3.5 - 5.0 g/dL Final   AST 05/13/2022 119 (H)  15 - 41 U/L Final   ALT 05/13/2022 145 (H)  0 - 44 U/L Final   Alkaline Phosphatase 05/13/2022 59  38 - 126 U/L Final   Total Bilirubin 05/13/2022 0.6  0.3 - 1.2 mg/dL Final   GFR, Estimated 05/13/2022 >60  >60 mL/min Final   Comment: (NOTE) Calculated using the CKD-EPI Creatinine Equation (2021)    Anion gap 05/13/2022 11  5 - 15 Final   Performed at [facility] [address], [place], [place] [address]  Orders Only on 12/29/2021  Component Date Value Ref Range Status   WBC 12/29/2021 5.8  3.4 - 10.8 x10E3/uL Final   RBC 12/29/2021 4.75  4.14 - 5.80 x10E6/uL Final   Hemoglobin 12/29/2021 15.4  13.0 - 17.7 g/dL Final   Hematocrit 12/29/2021 45.1  37.5 - 51.0 % Final   MCV 12/29/2021 95  79 - 97 fL Final   MCH 12/29/2021 32.4  26.6 - 33.0 pg Final   MCHC 12/29/2021 34.1  31.5 - 35.7 g/dL Final   RDW 12/29/2021 12.9  11.6 - 15.4 % Final   Platelets 12/29/2021 230  150 - 450 x10E3/uL Final   Neutrophils 12/29/2021 54  Not Estab. % Final   Lymphs 12/29/2021 26  Not Estab. % Final   Monocytes 12/29/2021 16  Not Estab. % Final   Eos 12/29/2021 3  Not Estab. % Final   Basos 12/29/2021 1  Not Estab. % Final   Neutrophils Absolute 12/29/2021 3.1  1.4 - 7.0 x10E3/uL Final   Lymphocytes Absolute 12/29/2021 1.5  0.7 - 3.1 x10E3/uL Final   Monocytes Absolute 12/29/2021 0.9  0.1 - 0.9 x10E3/uL Final   EOS (ABSOLUTE) 12/29/2021 0.2  0.0 - 0.4 x10E3/uL Final   Basophils Absolute 12/29/2021 0.1   0.0 - 0.2 x10E3/uL Final   Immature Granulocytes 12/29/2021 0  Not Estab. % Final   Immature Grans (Abs) 12/29/2021 0.0  0.0 - 0.1 x10E3/uL Final   Glucose 12/29/2021 105 (H)  70 - 99 mg/dL Final   BUN 12/29/2021 7  6 - 20 mg/dL Final   Creatinine, Ser 12/29/2021 0.73 (L)  0.76 - 1.27 mg/dL Final   eGFR 12/29/2021 119  >59 mL/min/1.73 Final   BUN/Creatinine Ratio 12/29/2021 10  9 - 20 Final   Sodium 12/29/2021 139  134 - 144 mmol/L Final   Potassium 12/29/2021 4.0  3.5 - 5.2 mmol/L Final   Chloride 12/29/2021 103  96 - 106 mmol/L Final   CO2 12/29/2021 22  20 - 29 mmol/L Final   Calcium 12/29/2021 9.0  8.7 - 10.2 mg/dL Final   Total Protein 12/29/2021 6.9  6.0 - 8.5 g/dL Final   Albumin 12/29/2021 4.2  4.1 - 5.1 g/dL Final   Globulin, Total 12/29/2021 2.7  1.5 - 4.5 g/dL Final   Albumin/Globulin Ratio 12/29/2021 1.6  1.2 - 2.2 Final   Bilirubin Total 12/29/2021 <0.2  0.0 - 1.2 mg/dL Final   Alkaline Phosphatase 12/29/2021 74  44 - 121 IU/L Final   AST 12/29/2021 87 (H)  0 - 40 IU/L Final   ALT 12/29/2021 76 (H)  0 - 44 IU/L Final   Cholesterol, Total 12/29/2021 169  100 - 199 mg/dL Final   Triglycerides 12/29/2021 61  0 - 149 mg/dL Final   HDL 12/29/2021 77  >39 mg/dL Final   VLDL Cholesterol Cal 12/29/2021 12  5 - 40 mg/dL Final   LDL Chol Calc (NIH) 12/29/2021 80  0 - 99 mg/dL Final   Chol/HDL Ratio 12/29/2021 2.2  0.0 - 5.0 ratio Final   Comment:                                   T. Chol/HDL Ratio                                             Men  Women                               1/2 Avg.Risk  3.4    3.3                                   Avg.Risk  5.0    4.4                                2X Avg.Risk  9.6    7.1  3X Avg.Risk 23.4   11.0    Hgb A1c MFr Bld 12/29/2021 5.7 (H)  4.8 - 5.6 % Final   Comment:          Prediabetes: 5.7 - 6.4          Diabetes: >6.4          Glycemic control for adults with diabetes: <7.0    Est. average glucose Bld  gHb Est-m* 12/29/2021 117  mg/dL Final   specimen status report 12/29/2021 Comment   Final   Comment: Please note Please note The date and/or time of collection was not indicated on the requisition as required by state and federal law.  The date of receipt of the specimen was used as the collection date if not supplied.     Blood Alcohol level:  Lab Results  Component Value Date   ETH 281 (H) 01/25/2017   ETH (H) 07/29/2008    186        LOWEST DETECTABLE LIMIT FOR SERUM ALCOHOL IS 5 mg/dL FOR MEDICAL PURPOSES ONLY    Metabolic Disorder Labs: Lab Results  Component Value Date   HGBA1C 5.8 (H) 05/10/2022   MPG 120 05/10/2022   No results found for: "PROLACTIN" Lab Results  Component Value Date   CHOL 248 (H) 05/10/2022   TRIG 69 05/10/2022   HDL 110 05/10/2022   CHOLHDL 2.3 05/10/2022   VLDL 14 05/10/2022   LDLCALC 124 (H) 05/10/2022   LDLCALC 80 12/29/2021    Therapeutic Lab Levels: No results found for: "LITHIUM" No results found for: "VALPROATE" No results found for: "CBMZ"  Physical Findings   AIMS    Flowsheet Row Admission (Discharged) from 01/26/2017 in [facility]  AIMS Total Score 0      AUDIT    Flowsheet Row Counselor from 04/01/2021 in [facility] Admission (Discharged) from 01/26/2017 in [facility]  Alcohol Use Disorder Identification Test Final Score (AUDIT) 13 9      GAD-7    Flowsheet Row Counselor from 11/10/2021 in [facility] Counselor from 09/23/2021 in [facility] Counselor from 09/01/2021 in [facility] Counselor from 08/04/2021 in [facility] Counselor from 07/07/2021 in [facility]  Total GAD-7 Score '8 5 9 6 18      '$ PHQ2-9    [place] [facility] from 05/10/2022 in [facility] Counselor from 11/10/2021 in [facility] Counselor from 09/23/2021 in [facility] Counselor from 09/01/2021 in [facility] Counselor from 07/07/2021 in [place]  PHQ-2 Total Score 1 0 0 0 1  PHQ-9 Total Score '7 3 5 3 3      '$ Flowsheet Row ED from 05/10/2022 in [facility] Counselor from 09/23/2021 in [facility] Counselor from 04/01/2021 in [place] No Risk No Risk No Risk        Musculoskeletal  Strength & Muscle Tone: within normal limits Gait & Station: normal Patient leans: N/A  Psychiatric Specialty Exam  Presentation  General Appearance:  Appropriate for Environment; Casual  Eye Contact: Fair  Speech: Clear and Coherent  Speech Volume: Normal  Handedness:No data recorded  Mood and Affect  Mood: Euthymic  Affect: Appropriate   Thought Process  Thought Processes:  Coherent  Descriptions of Associations:Intact  Orientation:Full (Time, Place and Person)  Thought Content:Logical  Diagnosis of Schizophrenia or Schizoaffective disorder in past: No    Hallucinations:Hallucinations: None  Ideas of Reference:None  Suicidal Thoughts:Suicidal Thoughts: No  Homicidal Thoughts:Homicidal Thoughts: No   Sensorium  Memory: Immediate Fair; Recent Fair  Judgment: Fair  Insight: Fair   Community education officer  Concentration: Fair  Attention Span: Fair  Recall:No data recorded Fund of Knowledge: Fair  Language: Fair   Psychomotor Activity  Psychomotor Activity: Psychomotor Activity: Normal   Assets  Assets: Desire for Improvement; Resilience; Social Support; Housing   Sleep  Sleep: Sleep: Good   No data recorded  Physical Exam  Physical Exam HENT:     Head: Normocephalic and atraumatic.  Pulmonary:      Effort: Pulmonary effort is normal.  Skin:    Comments: Has peeled the skin and nails of both thumbs   Neurological:     Mental Status: He is alert and oriented to person, place, and time.    Review of Systems  Psychiatric/Behavioral:  Negative for hallucinations and suicidal ideas. The patient is nervous/anxious. The patient does not have insomnia.    Blood pressure 122/84, pulse 79, temperature 98.2 F (36.8 C), temperature source Oral, resp. rate 18, SpO2 99 %. There is no height or weight on file to calculate BMI.  Treatment Plan Summary: Daily contact with patient to assess and evaluate symptoms and progress in treatment and Medication management   Based on assessment today patient appears to be endorsing some anxiety symptoms without Etoh, but is planning for his d'c. Recommend that patient get an appt on the books with Dr. Kai Levins before discharge to his mother. Will increase his Propanolol and monitor today, due to anxiety. Will also increase Naltrexone to maintenance dose.      Alcohol use disorder, history of moderate withdrawal symptoms - Ativan taper with CIWA completed 05/14/22 -- Increase Naltrexone to '50mg'$  daily   Restarted home medications, different dosages been as documented in the medical record, given the patient's lack of adherence - Increase Propanolol IR to '30mg'$  BID - Seroquel 50 mg nightly - Paxil 40 mg daily - Lisinopril 20 mg daily   Medical: Routine labs: Notable for slight elevation in LFTs but trending down, UDS positive for marijuana       PGY-3 Freida Busman, MD 05/16/2022 2:09 PM

## 2022-05-16 NOTE — ED Notes (Signed)
Pt sleeping in room. RR even and unlabored. No noted distress. Will continue to monitor for safety

## 2022-05-16 NOTE — ED Notes (Signed)
Pt sleeping in bed. RR even and unlabored. No noted distress. Will continue to monitor for safety

## 2022-05-16 NOTE — Group Note (Signed)
Group Topic: Overcoming Obstacles  Group Date: 05/16/2022 Start Time: 0800 End Time: 0900 Facilitators: Mervyn Skeeters D, NT  Department: [facility]  Number of Participants: 6  Group Focus: relaxation Treatment Modality:  Psychoeducation Interventions utilized were assignment Purpose: increase insight  Name: [name] Date of Birth: 1980/11/12  MR: [id number]    Level of Participation: minimal Quality of Participation: attentive Interactions with others: gave feedback Mood/Affect: appropriate Triggers (if applicable): n/a Cognition: coherent/clear Progress: Moderate Response: n/a Plan: follow-up needed  Patients Problems:  Patient Active Problem List   Diagnosis Date Noted   Alcohol use disorder 05/10/2022   GAD (generalized anxiety disorder) 07/07/2021   Agoraphobia 07/07/2021   Bipolar 1 disorder, mixed, moderate ([place]) 05/13/2021   Substance induced mood disorder ([place]) 04/01/2021   PTSD (post-traumatic stress disorder) 04/01/2021   Alcohol abuse with alcohol-induced mood disorder ([place]) 01/26/2017   Intermittent explosive disorder 01/26/2017

## 2022-05-16 NOTE — ED Notes (Signed)
Outside with nurse

## 2022-05-17 DIAGNOSIS — R000 Tachycardia, unspecified: Secondary | ICD-10-CM | POA: Diagnosis not present

## 2022-05-17 DIAGNOSIS — F17210 Nicotine dependence, cigarettes, uncomplicated: Secondary | ICD-10-CM | POA: Diagnosis not present

## 2022-05-17 DIAGNOSIS — F411 Generalized anxiety disorder: Secondary | ICD-10-CM | POA: Diagnosis not present

## 2022-05-17 DIAGNOSIS — F319 Bipolar disorder, unspecified: Secondary | ICD-10-CM | POA: Diagnosis not present

## 2022-05-17 DIAGNOSIS — F1090 Alcohol use, unspecified, uncomplicated: Secondary | ICD-10-CM | POA: Diagnosis not present

## 2022-05-17 DIAGNOSIS — I10 Essential (primary) hypertension: Secondary | ICD-10-CM | POA: Diagnosis not present

## 2022-05-17 DIAGNOSIS — F4310 Post-traumatic stress disorder, unspecified: Secondary | ICD-10-CM | POA: Diagnosis not present

## 2022-05-17 DIAGNOSIS — F429 Obsessive-compulsive disorder, unspecified: Secondary | ICD-10-CM | POA: Diagnosis not present

## 2022-05-17 DIAGNOSIS — Z1152 Encounter for screening for COVID-19: Secondary | ICD-10-CM | POA: Diagnosis not present

## 2022-05-17 MED ORDER — QUETIAPINE FUMARATE 50 MG PO TABS: 50.0000 mg | ORAL_TABLET | Freq: Every day | ORAL | 1 refills | Status: DC

## 2022-05-17 MED ORDER — LISINOPRIL 20 MG PO TABS: 20.0000 mg | ORAL_TABLET | Freq: Every day | ORAL | 1 refills | Status: DC

## 2022-05-17 MED ORDER — HYDROXYZINE HCL 25 MG PO TABS: 25.0000 mg | ORAL_TABLET | Freq: Three times a day (TID) | ORAL | 1 refills | Status: DC | PRN

## 2022-05-17 MED ORDER — PROPRANOLOL HCL 10 MG PO TABS: 30.0000 mg | ORAL_TABLET | Freq: Two times a day (BID) | ORAL | 1 refills | Status: DC

## 2022-05-17 MED ORDER — NICOTINE 21 MG/24HR TD PT24: 21.0000 mg | MEDICATED_PATCH | Freq: Every day | TRANSDERMAL | 1 refills | Status: DC

## 2022-05-17 MED ORDER — NALTREXONE HCL 50 MG PO TABS: 50.0000 mg | ORAL_TABLET | Freq: Every day | ORAL | 1 refills | Status: DC

## 2022-05-17 MED ORDER — PAROXETINE HCL 40 MG PO TABS: 40.0000 mg | ORAL_TABLET | Freq: Every day | ORAL | 1 refills | Status: DC

## 2022-05-17 NOTE — ED Notes (Signed)
Pt sleeping in bed. RR even and unlabored. No noted distress. Will continue to monitor for safety

## 2022-05-17 NOTE — ED Notes (Signed)
Pt sleeping in bed. RR even and unlabored. Will continue to monitor for safety

## 2022-05-17 NOTE — ED Provider Notes (Cosign Needed Addendum)
FBC/OBS ASAP Discharge Summary  Date and Time: 05/17/2022 11:56 AM  Name: [name]  MRN:  [id number]   Discharge Diagnoses:  Final diagnoses:  Alcohol use disorder   HPI: Patient initially presented on 05/10/2022 requesting alcohol detox.  He was recommended for treatment in the [facility]  Per admission note, "The patient presented on 3/4 with his mother for assessment at the behavioral health urgent care.  He was referred for consideration of facility based crisis admission by his outpatient psychiatric provider.  The patient reports that alcohol has caused significant problems for him throughout his life.  He reports a DUI 6 years ago, issues with anger when intoxicated, problems showing up to work, and interpersonal dysfunction with romantic partners and his family.  He states that he desires to quit and says "I am a slave to it".  He reports that he normally experiences some alcohol withdrawal symptoms such as "cranky and shaky".  He denies experiencing seizures or delirium tremens.  He reports that he drinks approximately 15 to 1812 ounce beers per day.  He states that he has never done detox before.  He is uncertain what he wants after detox, but he says he may be interested in residential rehab.  His mother states that she would like for [place] to investigate self-pay options in addition to [facility] residential".  Subjective:   [name], 42 y.o., male patient seen face to face by this provider and chart reviewed on 05/17/22.  Review patient has a past psychiatric history of GAD, OCD, Insomnia, Chronic PTSD, EtOH Use Disorder, Bipolar Disorder, SAD, and ADHD, 1 Suicide Attempt (cut self with knife, teenager), past Psychiatric Hospitalizations (latest [facility] 2018). Patient has services in place with [facility] behavioral health outpatient services on the second floor.  He currently sees Dr. Fatima Sanger for medication management and Hilario Quarry for therapy.  On today's evaluation [name] is served laughing and talking with other patients.  He is pleasant upon approach.  He is alert/oriented x 4, calm, cooperative, and attentive.  He has normal speech and behavior.  He denies any concerns with appetite or sleep.  He denies any withdrawal symptoms.  He is denying any depressive symptoms at this time.  He has a euthymic affect.  He endorses some anxiety at times that has caused him to bite his fingernails.  He is requesting to be discharged today.  He and his mother are working with [facility] to obtain admission later in week.  They are reviewing his insurance and information at this time.  Patient states he is motivated for treatment and will be discharged home to his mother's house. States this is a safe environment for him to be while he awaits for bed availability at [facility].  He feels confident that he can stay sober.  He continues to deny SI/HI/AVH.  He verbally contracts for safety.  He denies access to firearms/weapons.  He does not appear to be responding to internal/external stimuli.   Stay Summary: [name] was admitted to the [facility] for Alcohol use disorder and crisis management.  He was treated with the following medications: Hydroxyzine 25 mg 3 times daily as needed, lisinopril 20 mg daily, naltrexone 50 mg daily, NicoDerm CQ 21 mg patch daily, Paxil 40 mg daily, propranolol 30 mg twice daily, and Seroquel 50 mg daily at bedtime.  Medications were tolerated with no adverse reactions.  He was instructed on how to take medications as prescribed.  In addition he was educated to monitor blood pressure daily due to taking propranolol twice daily.  Patient verbalized understanding.  He will be discharged with printed prescriptions that will include a 1 month refill, which is the requirement for [facility] if patient obtains admission.  In addition he will be provided with a 14-day sample of medications.  Patient is thankful for his time on the unit and states he feels confident  to continue his journey free of substances.  [name] will follow up with the services as listed below under Follow up Information.   Upon completion of this admission the [name] was both mentally and medically stable for discharge denying suicidal/homicidal ideation, auditory/visual/tactile hallucinations, delusional thoughts and paranoia.      Total Time spent with patient: 30 minutes  Past Psychiatric History: as documented in H&P Past Medical History:  as documented in H&P Family History:  as documented in H&P Family Psychiatric History:  as documented in H&P Social History:  as documented in H&P Tobacco Cessation:  A prescription for an FDA-approved tobacco cessation medication provided at discharge  Current Medications:  Current Facility-Administered Medications  Medication Dose Route Frequency Provider Last Rate Last Admin   acetaminophen (TYLENOL) tablet 650 mg  650 mg Oral Q6H PRN Corky Sox, MD       alum & mag hydroxide-simeth (MAALOX/MYLANTA) 200-200-20 MG/5ML suspension 30 mL  30 mL Oral Q4H PRN Corky Sox, MD       hydrOXYzine (ATARAX) tablet 25 mg  25 mg Oral TID PRN Corky Sox, MD   25 mg at 05/16/22 2114   lisinopril (ZESTRIL) tablet 20 mg  20 mg Oral Daily Corky Sox, MD   20 mg at 05/17/22 0933   magnesium hydroxide (MILK OF MAGNESIA) suspension 30 mL  30 mL Oral Daily PRN Corky Sox, MD       multivitamin with minerals tablet 1 tablet  1 tablet Oral Daily Corky Sox, MD   1 tablet at 05/17/22 0933   naltrexone (DEPADE) tablet 50 mg  50 mg Oral Daily Damita Dunnings B, MD   50 mg at 05/17/22 0933   nicotine (NICODERM CQ - dosed in mg/24 hours) patch 21 mg  21 mg Transdermal Q0600 Corky Sox, MD   21 mg at 05/17/22 0600   nicotine polacrilex (NICORETTE) gum 2 mg  2 mg Oral PRN Corky Sox, MD       PARoxetine (PAXIL) tablet 40 mg  40 mg Oral Daily Corky Sox, MD   40 mg at 05/17/22 0933   propranolol (INDERAL) tablet 30  mg  30 mg Oral BID Damita Dunnings B, MD   30 mg at 05/17/22 0933   QUEtiapine (SEROQUEL) tablet 50 mg  50 mg Oral QHS Corky Sox, MD   50 mg at 05/16/22 2114   thiamine (VITAMIN B1) tablet 100 mg  100 mg Oral Daily Corky Sox, MD   100 mg at 05/17/22 [id number]   Current Outpatient Medications  Medication Sig Dispense Refill   lisinopril (ZESTRIL) 20 MG tablet Take 1 tablet (20 mg total) by mouth daily. 30 tablet 1   Multiple Vitamins-Minerals (ADULT GUMMY PO) Take 1 tablet by mouth daily.     PARoxetine (PAXIL) 40 MG tablet Take 1 tablet (40 mg total) by mouth daily. 30 tablet 1   propranolol (INDERAL) 40 MG tablet Take 1 tablet (40 mg total) by mouth 2 (two) times daily. 60 tablet 1   QUEtiapine (SEROQUEL) 200 MG tablet Take 1 tablet (  200 mg total) by mouth at bedtime. 30 tablet 1    PTA Medications:  Facility Ordered Medications  Medication   acetaminophen (TYLENOL) tablet 650 mg   alum & mag hydroxide-simeth (MAALOX/MYLANTA) 200-200-20 MG/5ML suspension 30 mL   magnesium hydroxide (MILK OF MAGNESIA) suspension 30 mL   hydrOXYzine (ATARAX) tablet 25 mg   nicotine (NICODERM CQ - dosed in mg/24 hours) patch 21 mg   multivitamin with minerals tablet 1 tablet   [EXPIRED] LORazepam (ATIVAN) tablet 1 mg   [EXPIRED] loperamide (IMODIUM) capsule 2-4 mg   [EXPIRED] ondansetron (ZOFRAN-ODT) disintegrating tablet 4 mg   [COMPLETED] LORazepam (ATIVAN) tablet 1 mg   Followed by   [COMPLETED] LORazepam (ATIVAN) tablet 1 mg   Followed by   [COMPLETED] LORazepam (ATIVAN) tablet 1 mg   Followed by   [COMPLETED] LORazepam (ATIVAN) tablet 1 mg   thiamine (VITAMIN B1) tablet 100 mg   nicotine polacrilex (NICORETTE) gum 2 mg   lisinopril (ZESTRIL) tablet 20 mg   PARoxetine (PAXIL) tablet 40 mg   QUEtiapine (SEROQUEL) tablet 50 mg   propranolol (INDERAL) tablet 30 mg   [COMPLETED] naltrexone (DEPADE) tablet 25 mg   naltrexone (DEPADE) tablet 50 mg   PTA Medications  Medication Sig    lisinopril (ZESTRIL) 20 MG tablet Take 1 tablet (20 mg total) by mouth daily.   PARoxetine (PAXIL) 40 MG tablet Take 1 tablet (40 mg total) by mouth daily.   propranolol (INDERAL) 40 MG tablet Take 1 tablet (40 mg total) by mouth 2 (two) times daily.   QUEtiapine (SEROQUEL) 200 MG tablet Take 1 tablet (200 mg total) by mouth at bedtime.   Multiple Vitamins-Minerals (ADULT GUMMY PO) Take 1 tablet by mouth daily.       05/16/2022   11:47 AM 05/15/2022    9:58 AM 05/13/2022    1:35 PM  Depression screen PHQ 2/9  Decreased Interest 0 1 0  Down, Depressed, Hopeless 1 0 0  PHQ - 2 Score 1 1 0  Altered sleeping 0 2 0  Tired, decreased energy 2 3 0  Change in appetite 1 2 0  Feeling bad or failure about yourself  3 2 0  Trouble concentrating 0 0 0  Moving slowly or fidgety/restless 0 1 0  Suicidal thoughts 0 0 0  PHQ-9 Score 7 11 0  Difficult doing work/chores  Very difficult Not difficult at all    Flowsheet Row ED from 05/10/2022 in [facility] Counselor from 09/23/2021 in [facility] Counselor from 04/01/2021 in [place] No Risk No Risk No Risk       Musculoskeletal  Strength & Muscle Tone: within normal limits Gait & Station: normal Patient leans: N/A  Psychiatric Specialty Exam  Presentation  General Appearance:  Appropriate for Environment  Eye Contact: Good  Speech: Clear and Coherent; Normal Rate  Speech Volume: Normal  Handedness: Right   Mood and Affect  Mood: Euthymic  Affect: Congruent   Thought Process  Thought Processes: Coherent  Descriptions of Associations:Intact  Orientation:Full (Time, Place and Person)  Thought Content:Logical  Diagnosis of Schizophrenia or Schizoaffective disorder in past: No    Hallucinations:Hallucinations: None  Ideas of Reference:None  Suicidal Thoughts:Suicidal Thoughts: No  Homicidal  Thoughts:Homicidal Thoughts: No   Sensorium  Memory: Immediate Good; Recent Good; Remote Good  Judgment: Good  Insight: Good   Executive Functions  Concentration: Good  Attention Span: Good  Recall: Good  Fund of Knowledge: Good  Language: Good   Psychomotor Activity  Psychomotor Activity: Psychomotor Activity: Normal   Assets  Assets: Communication Skills; Desire for Improvement; Housing; Physical Health; Leisure Time; Resilience   Sleep  Sleep: Sleep: Good   No data recorded  Physical Exam  Physical Exam Vitals and nursing note reviewed.  Constitutional:      General: He is not in acute distress.    Appearance: Normal appearance. He is well-developed.  HENT:     Head: Normocephalic and atraumatic.  Eyes:     General:        Right eye: No discharge.        Left eye: No discharge.  Cardiovascular:     Rate and Rhythm: Normal rate.  Pulmonary:     Effort: Pulmonary effort is normal. No respiratory distress.  Musculoskeletal:        General: No swelling.     Cervical back: Normal range of motion.  Skin:    General: Skin is dry.     Coloration: Skin is not jaundiced or pale.  Neurological:     Mental Status: He is alert and oriented to person, place, and time.  Psychiatric:        Attention and Perception: Attention and perception normal.        Mood and Affect: Mood normal.        Speech: Speech normal.        Behavior: Behavior is cooperative.        Thought Content: Thought content normal.        Cognition and Memory: Cognition normal.        Judgment: Judgment normal.    Review of Systems  Constitutional: Negative.   HENT: Negative.    Eyes: Negative.   Respiratory: Negative.    Cardiovascular: Negative.   Musculoskeletal: Negative.   Skin: Negative.   Neurological: Negative.   Psychiatric/Behavioral:  Positive for substance abuse.    Blood pressure (!) 130/95, pulse 65, temperature 97.9 F (36.6 C), temperature source  Oral, resp. rate 18, SpO2 100 %. There is no height or weight on file to calculate BMI.  Demographic Factors:  Male, Low socioeconomic status, and Unemployed  Loss Factors: Financial problems/change in socioeconomic status  Historical Factors: Impulsivity  Risk Reduction Factors:   Sense of responsibility to family, Religious beliefs about death, Living with another person, especially a relative, Positive social support, Positive therapeutic relationship, and Positive coping skills or problem solving skills  Continued Clinical Symptoms:  Severe Anxiety and/or Agitation Bipolar Disorder:   Depressive phase Alcohol/Substance Abuse/Dependencies  Cognitive Features That Contribute To Risk:  None    Suicide Risk:  Minimal: No identifiable suicidal ideation.  Patients presenting with no risk factors but with morbid ruminations; may be classified as minimal risk based on the severity of the depressive symptoms  Plan Of Care/Follow-up recommendations:  Activity:  as tolerated  Diet:  regular   Disposition:  Discharge patient   Patient will follow-up with [facility] behavioral health outpatient services on second floor for medication management and therapy.  Patient provided with printed prescriptions that include a 1 month refill for hydroxyzine 25 mg 3 times daily as needed, lisinopril 20 mg daily, naltrexone 50 mg daily, NicoDerm CQ 21 mg patch daily, Paxil 40 mg daily, propranolol 30 mg twice daily, and Seroquel 50 mg daily at bedtime.  He was also provided with a 14-day sample of medications.  Per SW note "LCSW contacted the patient's mother Bradley Chafe  [name] to follow up regarding plan. Per mother, she has been in contact with [facility] who is looking to admit the patient some time this week after his benefits have been confirmed. Mother aware that same update has been provided to LCSW. Mother reports the patient's plan is to come home with her for a little while until he is able to get  into a residential program. Mother reports patient would need a nicotine prescription if accepted to [facility]. Mother reports her plan is to pick the patient up around 3:00-3:30pm on today"  Revonda Humphrey, NP 05/17/2022, 11:56 AM

## 2022-05-17 NOTE — ED Notes (Signed)
Patient is awake and alert on unit.  He is calm and cooperative with care.  Patient is for discharge later this afternoon and will be picked up by his mom.  Patient is aware of this plan and is accepting of it.

## 2022-05-17 NOTE — ED Notes (Signed)
Patient A&O x 4, ambulatory. Patient discharged in no acute distress. Patient denied SI/HI, A/VH upon discharge. Patient verbalized understanding of all discharge instructions explained by staff, to include follow up appointments, RX's and safety plan. Pt belongings returned to patient from locker intact. Patient escorted to lobby via staff for transport to destination. Safety maintained.   

## 2022-05-17 NOTE — Discharge Planning (Signed)
Referral was received and per Enis Gash, patient has been accepted and can transfer to the facility on tomorrow by 7:00am. Update has been provided to the patient and MD made aware. Patient will need a 14-30 day supply of medication and one month refill. No nicotine gum allowed, however 14-30 day nicotine patches to be provided if needed. Mother has arrived to pick up the patient and is safe with him returning home at this time. Mother reports she will ensure the patient makes it to the facility on time. No other needs to report at this time.    LCSW will continue to follow up and provide updates as received.    Lucius Conn, LCSW Clinical Social Worker [facility] Ph: [phone]

## 2022-05-17 NOTE — ED Notes (Signed)
Pt in [place]

## 2022-05-17 NOTE — Discharge Planning (Signed)
LCSW spoke with patient on this morning regarding disposition plans. Patient reports he will be discharging on today back home with his mother Corie Chiquito [phone] until he can get into a residential program. Patient provided permission for LCSW to follow up with his mother to confirm plan. No other needs were reported by the patient. Patient aware that additional resources will be provided in AVS for his review.   LCSW contacted the patient's mother Corie Chiquito to follow up regarding plan. Per mother, she has been in contact with [facility] who is looking to admit the patient some time this week after his benefits have been confirmed. Mother aware that same update has been provided to LCSW. Mother reports the patient's plan is to come home with her for a little while until he is able to get into a residential program. Mother reports patient would need a nicotine prescription if accepted to [facility]. Mother reports her plan is to pick the patient up around 3:00-3:30pm on today. No other needs were reported by mother. Mother aware that MD and staff will be made aware. LCSW to sign off at this time.   Please inform if further LCSW needs arise prior to discharge.   Lucius Conn, LCSW Clinical Social Worker [facility] Ph: [phone]

## 2022-07-01 ENCOUNTER — Telehealth (HOSPITAL_COMMUNITY): Payer: Self-pay | Admitting: Student in an Organized Health Care Education/Training Program

## 2022-07-02 ENCOUNTER — Ambulatory Visit (INDEPENDENT_AMBULATORY_CARE_PROVIDER_SITE_OTHER): Payer: Medicaid Other | Admitting: Student in an Organized Health Care Education/Training Program

## 2022-07-02 VITALS — BP 128/85 | HR 89 | Ht 67.0 in | Wt 175.4 lb

## 2022-07-02 DIAGNOSIS — F4310 Post-traumatic stress disorder, unspecified: Secondary | ICD-10-CM

## 2022-07-02 DIAGNOSIS — I10 Essential (primary) hypertension: Secondary | ICD-10-CM | POA: Diagnosis not present

## 2022-07-02 DIAGNOSIS — F411 Generalized anxiety disorder: Secondary | ICD-10-CM

## 2022-07-02 DIAGNOSIS — F429 Obsessive-compulsive disorder, unspecified: Secondary | ICD-10-CM | POA: Diagnosis not present

## 2022-07-02 DIAGNOSIS — F1090 Alcohol use, unspecified, uncomplicated: Secondary | ICD-10-CM

## 2022-07-02 MED ORDER — NALTREXONE HCL 50 MG PO TABS: 50.0000 mg | ORAL_TABLET | Freq: Every day | ORAL | 1 refills | Status: DC

## 2022-07-02 MED ORDER — LISINOPRIL 20 MG PO TABS: 20.0000 mg | ORAL_TABLET | Freq: Every day | ORAL | 1 refills | Status: DC

## 2022-07-02 MED ORDER — PAROXETINE HCL 40 MG PO TABS: 40.0000 mg | ORAL_TABLET | Freq: Every day | ORAL | 1 refills | Status: DC

## 2022-07-02 MED ORDER — QUETIAPINE FUMARATE 50 MG PO TABS: 50.0000 mg | ORAL_TABLET | Freq: Every day | ORAL | 1 refills | Status: DC

## 2022-07-02 MED ORDER — PROPRANOLOL HCL 10 MG PO TABS: 30.0000 mg | ORAL_TABLET | Freq: Two times a day (BID) | ORAL | 1 refills | Status: DC

## 2022-07-02 MED ORDER — HYDROXYZINE HCL 25 MG PO TABS: 25.0000 mg | ORAL_TABLET | Freq: Three times a day (TID) | ORAL | 1 refills | Status: DC | PRN

## 2022-07-02 NOTE — Progress Notes (Signed)
BH MD/PA/NP OP Progress Note  07/02/2022 3:53 PM [name]  MRN:  [id number]  Chief Complaint:  Chief Complaint  Patient presents with   Follow-up   HPI:  [name] is a 42 yr old male who presents for Follow Up and Medication Management.  PPHx is significant for GAD, OCD, Insomnia, Chronic PTSD, EtOH Use Disorder, Bipolar Disorder, SAD, and ADHD, and 1 Suicide Attempt (cut self with knife, teenager), past Psychiatric Hospitalizations (last- [facility] 2018), and has been to [facility] 06/2022), and no history of Self Injurious Behavior.   He reports that he is doing much better.  He reports he completed the full residential program at [facility] and had even asked about getting into the extended program.  He reports that he is committed to maintaining his sobriety.  He reports while there there were other people that want committed and he realized this is not what he wants.  He reports that he was started on naltrexone and this has been helpful.  He reports no cravings.  He reports he has changed his routines to help maintain his sobriety.  He reports that he is going to [employer] but has not yet got a sponsor.  He reports that he has straightened out his PCP office and does have an appointment to see them.  He reports no side effects to his medications.  He reports since he has stopped drinking his anxiety has improved, his sleep is improved, that appointment everything has been improving.  Discussed with him that we would not make any changes to his medications at this time and he was agreeable with this.  He reports no SI, HI, or AVH.  He reports sleep is good.  He reports appetite is doing good.  He reports no other concerns at present.  He will return for follow-up in approximately 6 to 8 weeks.   Visit Diagnosis:    ICD-10-CM   1. GAD (generalized anxiety disorder)  F41.1 hydrOXYzine (ATARAX) 25 MG tablet    PARoxetine (PAXIL) 40 MG tablet    propranolol (INDERAL) 10 MG tablet     QUEtiapine (SEROQUEL) 50 MG tablet    2. Obsessive-compulsive disorder, unspecified type  F42.9 PARoxetine (PAXIL) 40 MG tablet    QUEtiapine (SEROQUEL) 50 MG tablet    3. Essential hypertension  I10 lisinopril (ZESTRIL) 20 MG tablet    4. PTSD (post-traumatic stress disorder)  F43.10 hydrOXYzine (ATARAX) 25 MG tablet    PARoxetine (PAXIL) 40 MG tablet    propranolol (INDERAL) 10 MG tablet    5. Alcohol use disorder  F10.90 naltrexone (DEPADE) 50 MG tablet      Past Psychiatric History: GAD, OCD, Insomnia, Chronic PTSD, EtOH Use Disorder, Bipolar Disorder, SAD, and ADHD, 1 Suicide Attempt (cut self with knife, teenager), past Psychiatric Hospitalizations (latest [facility] 2018).   Past Medical History: No past medical history on file.  Past Surgical History:  Procedure Laterality Date   arm tendon surgery      Family Psychiatric History: Father- Cocaine Use No Known Diagnosis' or Suicides  Family History: No family history on file.  Social History:  Social History   Socioeconomic History   Marital status: Single    Spouse name: Not on file   Number of children: Not on file   Years of education: Not on file   Highest education level: Not on file  Occupational History   Not on file  Tobacco Use   Smoking status: Every Day  Packs/day: .5    Types: Cigarettes   Smokeless tobacco: Never  Substance and Sexual Activity   Alcohol use: Yes    Alcohol/week: 21.0 standard drinks of alcohol    Types: 21 Cans of beer per week   Drug use: No   Sexual activity: Not Currently  Other Topics Concern   Not on file  Social History Narrative   Not on file   Social Determinants of Health   Financial Resource Strain: High Risk (04/01/2021)   Overall Financial Resource Strain (CARDIA)    Difficulty of Paying Living Expenses: Hard  Food Insecurity: No Food Insecurity (04/01/2021)   Hunger Vital Sign    Worried About Running Out of Food in the Last Year: Never true    Ran Out of Food  in the Last Year: Never true  Transportation Needs: No Transportation Needs (04/01/2021)   PRAPARE - Administrator, Civil Service (Medical): No    Lack of Transportation (Non-Medical): No  Physical Activity: Sufficiently Active (04/01/2021)   Exercise Vital Sign    Days of Exercise per Week: 7 days    Minutes of Exercise per Session: 150+ min  Stress: Stress Concern Present (04/01/2021)   [employer] of Occupational Health - Occupational Stress Questionnaire    Feeling of Stress : Very much  Social Connections: Socially Isolated (04/01/2021)   Social Connection and Isolation Panel [NHANES]    Frequency of Communication with Friends and Family: More than three times a week    Frequency of Social Gatherings with Friends and Family: Twice a week    Attends Religious Services: Never    Database administrator or Organizations: No    Attends Engineer, structural: Never    Marital Status: Never married    Allergies: No Known Allergies  Metabolic Disorder Labs: Lab Results  Component Value Date   HGBA1C 5.8 (H) 05/10/2022   MPG 120 05/10/2022   No results found for: "PROLACTIN" Lab Results  Component Value Date   CHOL 248 (H) 05/10/2022   TRIG 69 05/10/2022   HDL 110 05/10/2022   CHOLHDL 2.3 05/10/2022   VLDL 14 05/10/2022   LDLCALC 124 (H) 05/10/2022   LDLCALC 80 12/29/2021   Lab Results  Component Value Date   TSH 0.472 05/10/2022    Therapeutic Level Labs: No results found for: "LITHIUM" No results found for: "VALPROATE" No results found for: "CBMZ"  Current Medications: Current Outpatient Medications  Medication Sig Dispense Refill   hydrOXYzine (ATARAX) 25 MG tablet Take 1 tablet (25 mg total) by mouth 3 (three) times daily as needed for anxiety. 90 tablet 1   lisinopril (ZESTRIL) 20 MG tablet Take 1 tablet (20 mg total) by mouth daily. 30 tablet 1   naltrexone (DEPADE) 50 MG tablet Take 1 tablet (50 mg total) by mouth daily. 30 tablet 1    nicotine (NICODERM CQ - DOSED IN MG/24 HOURS) 21 mg/24hr patch Place 1 patch (21 mg total) onto the skin daily at 6 (six) AM. 28 patch 1   PARoxetine (PAXIL) 40 MG tablet Take 1 tablet (40 mg total) by mouth daily. 30 tablet 1   propranolol (INDERAL) 10 MG tablet Take 3 tablets (30 mg total) by mouth 2 (two) times daily. 180 tablet 1   QUEtiapine (SEROQUEL) 50 MG tablet Take 1 tablet (50 mg total) by mouth at bedtime. 30 tablet 1   No current facility-administered medications for this visit.     Musculoskeletal: Strength & Muscle Tone:  within normal limits Gait & Station: normal Patient leans: N/A  Psychiatric Specialty Exam: Review of Systems  Respiratory:  Negative for cough and shortness of breath.   Cardiovascular:  Negative for chest pain.  Gastrointestinal:  Negative for abdominal pain, constipation, diarrhea, nausea and vomiting.  Neurological:  Negative for weakness and headaches.  Psychiatric/Behavioral:  Negative for dysphoric mood, hallucinations, sleep disturbance and suicidal ideas. The patient is not nervous/anxious.     Blood pressure 128/85, pulse 89, height 5\' 7"  (1.702 m), weight 175 lb 6.4 oz (79.6 kg), SpO2 99 %.Body mass index is 27.47 kg/m.  General Appearance: Casual and Fairly Groomed  Eye Contact:  Good  Speech:  Clear and Coherent and Normal Rate  Volume:  Normal  Mood:  Euthymic  Affect:  Appropriate and Congruent  Thought Process:  Coherent and Goal Directed  Orientation:  Full (Time, Place, and Person)  Thought Content: WDL and Logical   Suicidal Thoughts:  No  Homicidal Thoughts:  No  Memory:  Immediate;   Good Recent;   Good  Judgement:  Good  Insight:  Good  Psychomotor Activity:  Normal  Concentration:  Concentration: Good and Attention Span: Good  Recall:  Good  Fund of Knowledge: Good  Language: Good  Akathisia:  Negative  Handed:  Right  AIMS (if indicated): not done  Assets:  Communication Skills Desire for  Improvement Housing Physical Health Resilience Social Support  ADL's:  Intact  Cognition: WNL  Sleep:  Good   Screenings: AIMS    Flowsheet Row [facility]  AIMS Total Score 0      AUDIT    Flowsheet Row Counselor from 04/01/2021 in [facility]  Alcohol Use Disorder Identification Test Final Score (AUDIT) 13 9      GAD-7    Flowsheet Row Counselor from 11/10/2021 in [facility] Counselor from 09/23/2021 in [facility] Counselor from 09/01/2021 in [facility] Counselor from 08/04/2021 in [facility] Counselor from 07/07/2021 in [facility]  Total GAD-7 Score 8 5 9 6 18       PHQ2-9    Flowsheet Row ED from 05/10/2022 in [facility] Counselor from 11/10/2021 in [facility] Counselor from 09/23/2021 in [facility] Counselor from 09/01/2021 in [facility] Counselor from 07/07/2021 in [facility]  PHQ-2 Total Score 1 0 0 0 1  PHQ-9 Total Score 5 3 5 3 3       Flowsheet Row ED from 05/10/2022 in [facility] Counselor from 09/23/2021 in [facility] Counselor from 04/01/2021 in [facility]  C-SSRS RISK CATEGORY No Risk No Risk No Risk        Assessment and Plan:  [name] is a 42 yr old male who presents for Follow Up and Medication Management.  PPHx is significant for GAD, OCD, Insomnia, Chronic PTSD, EtOH Use Disorder, Bipolar Disorder, SAD, and ADHD, and 1 Suicide Attempt (cut self with knife, teenager), past Psychiatric  Hospitalizations (last- [facility] 2018), and has been to [facility] 06/2022), and no history of Self Injurious Behavior.    Khaliel has completed Residential Rehab at [facility] and is continuing to remain sober.  He was  started on Naltrexone and reports it has been helpful and we will continue with it.  He is reporting improvement in all areas with the stopping of his drinking and is continuing with AA meetings.  We will not make any changes to his medications at this time.  Refills were sent.  He will return for follow-up in approximately 6-8 weeks.   OCD  GAD  Chronic PTSD  Bipolar Disorder by Hx : -Continue Paxil 40 mg daily for anxiety and OCD. 30 tablets with 1 refills -Continue Propanolol 40 mg BID for anxiety and HTN. 60 tablets with 1 refills -Continue Seroquel 200 mg for QHS for mood stability and insomnia. 30 tablets with 1 refills. -Continue Hydroxyzine 25 mg TID PRN for anxiety. 90 tablets with 1 refill.    HTN: -Continue Lisinopril 20 mg daily.  30 tablets with 1 refill   EtOH Use Disorder: -Completed stay at [facility] -Continue Naltrexone 50 mg daily.  30 tablets with 1 refill.    Collaboration of Care:   Patient/Guardian was advised Release of Information must be obtained prior to any record release in order to collaborate their care with an outside provider. Patient/Guardian was advised if they have not already done so to contact the registration department to sign all necessary forms in order for [place] to release information regarding their care.   Consent: Patient/Guardian gives verbal consent for treatment and assignment of benefits for services provided during this visit. Patient/Guardian expressed understanding and agreed to proceed.    Lauro Franklin, MD 07/02/2022, 3:53 PM

## 2022-08-30 ENCOUNTER — Encounter (HOSPITAL_COMMUNITY): Payer: Medicaid Other | Admitting: Student in an Organized Health Care Education/Training Program

## 2022-08-30 NOTE — Progress Notes (Deleted)
BH MD/PA/NP OP Progress Note  08/30/2022 7:20 AM [name]  MRN:  [id number]  Chief Complaint:  No chief complaint on file.  HPI:  [name] is a 42 yr old male who presents for Follow Up and Medication Management.  PPHx is significant for GAD, OCD, Insomnia, Chronic PTSD, EtOH Use Disorder, Bipolar Disorder, SAD, and ADHD, and 1 Suicide Attempt (cut self with knife, teenager), past Psychiatric Hospitalizations (last- [facility] 2018), and has been to [facility] 06/2022), and no history of Self Injurious Behavior.   He reports ***   Visit Diagnosis:  No diagnosis found.   Past Psychiatric History: GAD, OCD, Insomnia, Chronic PTSD, EtOH Use Disorder, Bipolar Disorder, SAD, and ADHD, 1 Suicide Attempt (cut self with knife, teenager), past Psychiatric Hospitalizations (latest [facility] 2018).   Past Medical History: No past medical history on file.  Past Surgical History:  Procedure Laterality Date   arm tendon surgery      Family Psychiatric History: Father- Cocaine Use No Known Diagnosis' or Suicides  Family History: No family history on file.  Social History:  Social History   Socioeconomic History   Marital status: Single    Spouse name: Not on file   Number of children: Not on file   Years of education: Not on file   Highest education level: Not on file  Occupational History   Not on file  Tobacco Use   Smoking status: Every Day    Packs/day: .5    Types: Cigarettes   Smokeless tobacco: Never  Substance and Sexual Activity   Alcohol use: Yes    Alcohol/week: 21.0 standard drinks of alcohol    Types: 21 Cans of beer per week   Drug use: No   Sexual activity: Not Currently  Other Topics Concern   Not on file  Social History Narrative   Not on file   Social Determinants of Health   Financial Resource Strain: High Risk (04/01/2021)   Overall Financial Resource Strain (CARDIA)    Difficulty of Paying Living Expenses: Hard  Food Insecurity: No Food  Insecurity (04/01/2021)   Hunger Vital Sign    Worried About Running Out of Food in the Last Year: Never true    Ran Out of Food in the Last Year: Never true  Transportation Needs: No Transportation Needs (04/01/2021)   PRAPARE - Administrator, Civil Service (Medical): No    Lack of Transportation (Non-Medical): No  Physical Activity: Sufficiently Active (04/01/2021)   Exercise Vital Sign    Days of Exercise per Week: 7 days    Minutes of Exercise per Session: 150+ min  Stress: Stress Concern Present (04/01/2021)   [employer] of Occupational Health - Occupational Stress Questionnaire    Feeling of Stress : Very much  Social Connections: Socially Isolated (04/01/2021)   Social Connection and Isolation Panel [NHANES]    Frequency of Communication with Friends and Family: More than three times a week    Frequency of Social Gatherings with Friends and Family: Twice a week    Attends Religious Services: Never    Database administrator or Organizations: No    Attends Banker Meetings: Never    Marital Status: Never married    Allergies: No Known Allergies  Metabolic Disorder Labs: Lab Results  Component Value Date   HGBA1C 5.8 (H) 05/10/2022   MPG 120 05/10/2022   No results found for: "PROLACTIN" Lab Results  Component Value Date   CHOL 248 (  H) 05/10/2022   TRIG 69 05/10/2022   HDL 110 05/10/2022   CHOLHDL 2.3 05/10/2022   VLDL 14 05/10/2022   LDLCALC 124 (H) 05/10/2022   LDLCALC 80 12/29/2021   Lab Results  Component Value Date   TSH 0.472 05/10/2022    Therapeutic Level Labs: No results found for: "LITHIUM" No results found for: "VALPROATE" No results found for: "CBMZ"  Current Medications: Current Outpatient Medications  Medication Sig Dispense Refill   hydrOXYzine (ATARAX) 25 MG tablet Take 1 tablet (25 mg total) by mouth 3 (three) times daily as needed for anxiety. 90 tablet 1   lisinopril (ZESTRIL) 20 MG tablet Take 1 tablet (20 mg  total) by mouth daily. 30 tablet 1   naltrexone (DEPADE) 50 MG tablet Take 1 tablet (50 mg total) by mouth daily. 30 tablet 1   nicotine (NICODERM CQ - DOSED IN MG/24 HOURS) 21 mg/24hr patch Place 1 patch (21 mg total) onto the skin daily at 6 (six) AM. 28 patch 1   PARoxetine (PAXIL) 40 MG tablet Take 1 tablet (40 mg total) by mouth daily. 30 tablet 1   propranolol (INDERAL) 10 MG tablet Take 3 tablets (30 mg total) by mouth 2 (two) times daily. 180 tablet 1   QUEtiapine (SEROQUEL) 50 MG tablet Take 1 tablet (50 mg total) by mouth at bedtime. 30 tablet 1   No current facility-administered medications for this visit.     Musculoskeletal: Strength & Muscle Tone: within normal limits Gait & Station: normal Patient leans: N/A *** Psychiatric Specialty Exam: Review of Systems  There were no vitals taken for this visit.There is no height or weight on file to calculate BMI.  General Appearance: Casual and Fairly Groomed  Eye Contact:  Good  Speech:  Clear and Coherent and Normal Rate  Volume:  Normal  Mood:  Euthymic  Affect:  Appropriate and Congruent  Thought Process:  Coherent and Goal Directed  Orientation:  Full (Time, Place, and Person)  Thought Content: WDL and Logical   Suicidal Thoughts:  No  Homicidal Thoughts:  No  Memory:  Immediate;   Good Recent;   Good  Judgement:  Good  Insight:  Good  Psychomotor Activity:  Normal  Concentration:  Concentration: Good and Attention Span: Good  Recall:  Good  Fund of Knowledge: Good  Language: Good  Akathisia:  Negative  Handed:  Right  AIMS (if indicated): not done  Assets:  Communication Skills Desire for Improvement Housing Physical Health Resilience Social Support  ADL's:  Intact  Cognition: WNL  Sleep:  Good   Screenings: AIMS    Flowsheet Row [facility]  AIMS Total Score 0      AUDIT    Flowsheet Row Counselor from 04/01/2021 in  [facility]Dis[facility]  Alcohol Use Disorder Identification Test Final Score (AUDIT) 13 9      GAD-7    Flowsheet Row Counselor from 11/10/2021 in [facility] Counselor from 09/23/2021 in [facility] Counselor from 09/01/2021 in [facility] Counselor from 08/04/2021 in [facility] Counselor from 07/07/2021 in [facility]  Total GAD-7 Score 8 5 9 6 18       PHQ2-9    Flowsheet Row ED from 05/10/2022 in [facility] Counselor from 11/10/2021 in [facility] Counselor from 09/23/2021 in [facility] Counselor from 09/01/2021 in [facility] Counselor from 07[facility]07/2021 in [facility]  PHQ-2 Total Score 1 0 0 0 1  PHQ-9 Total Score 5 3 5 3 3       Flowsheet Row ED from 05/10/2022 in [facility] Counselor from 09/23/2021 in [facility] Counselor from 04/01/2021 in [facility]  C-SSRS RISK CATEGORY No Risk No Risk No Risk        Assessment and Plan:  [name] is a 42 yr old male who presents for Follow Up and Medication Management.  PPHx is significant for GAD, OCD, Insomnia, Chronic PTSD, EtOH Use Disorder, Bipolar Disorder, SAD, and ADHD, and 1 Suicide Attempt (cut self with knife, teenager), past Psychiatric Hospitalizations (last- [facility] 2018), and has been to [facility]idential [facility] 06/2022), and no history of Self Injurious Behavior.    [name] has ***   OCD  GAD  Chronic PTSD  Bipolar Disorder by Hx : -Continue Paxil 40 mg daily for anxiety and OCD. 30 tablets with 1 refills -Continue Propanolol 40 mg BID for anxiety and HTN. 60  tablets with 1 refills -Continue Seroquel 200 mg for QHS for mood stability and insomnia. 30 tablets with 1 refills. -Continue Hydroxyzine 25 mg TID PRN for anxiety. 90 tablets with 1 refill.    HTN: -Continue Lisinopril 20 mg daily.  30 tablets with 1 refill   EtOH Use Disorder: -Completed stay at [facility] -Continue Naltrexone 50 mg daily.  30 tablets with 1 refill.    Collaboration of Care:   Patient/Guardian was advised Release of Information must be obtained prior to any record release in order to collaborate their care with an outside provider. Patient/Guardian was advised if they have not already done so to contact the registration department to sign all necessary forms in order for [place] to release information regarding their care.   Consent: Patient/Guardian gives verbal consent for treatment and assignment of benefits for services provided during this visit. Patient/Guardian expressed understanding and agreed to proceed.    Lauro Franklin, MD 08/30/2022, 7:20 AM

## 2022-09-03 ENCOUNTER — Telehealth (HOSPITAL_COMMUNITY): Payer: Self-pay

## 2022-09-03 DIAGNOSIS — F1090 Alcohol use, unspecified, uncomplicated: Secondary | ICD-10-CM

## 2022-09-03 MED ORDER — NALTREXONE HCL 50 MG PO TABS: 50.0000 mg | ORAL_TABLET | Freq: Every day | ORAL | 1 refills | Status: DC

## 2022-09-03 NOTE — Telephone Encounter (Signed)
Received message patient needed a refill of his naltrexone.  This was sent in.  Sent: -Naltrexone 50 mg daily.  30 tablets with 1 refill.   Arna Snipe MD Resident

## 2022-09-24 ENCOUNTER — Ambulatory Visit (INDEPENDENT_AMBULATORY_CARE_PROVIDER_SITE_OTHER): Payer: MEDICAID | Admitting: Student in an Organized Health Care Education/Training Program

## 2022-09-24 VITALS — BP 144/88 | HR 71 | Ht 67.0 in | Wt 167.0 lb

## 2022-09-24 DIAGNOSIS — R4689 Other symptoms and signs involving appearance and behavior: Secondary | ICD-10-CM

## 2022-09-24 DIAGNOSIS — F411 Generalized anxiety disorder: Secondary | ICD-10-CM

## 2022-09-24 DIAGNOSIS — F4310 Post-traumatic stress disorder, unspecified: Secondary | ICD-10-CM

## 2022-09-24 DIAGNOSIS — F429 Obsessive-compulsive disorder, unspecified: Secondary | ICD-10-CM | POA: Diagnosis not present

## 2022-09-24 DIAGNOSIS — F1090 Alcohol use, unspecified, uncomplicated: Secondary | ICD-10-CM

## 2022-09-24 MED ORDER — PROPRANOLOL HCL 10 MG PO TABS: 30.0000 mg | ORAL_TABLET | Freq: Two times a day (BID) | ORAL | 1 refills | Status: DC

## 2022-09-24 MED ORDER — NALTREXONE HCL 50 MG PO TABS: 50.0000 mg | ORAL_TABLET | Freq: Every day | ORAL | 1 refills | Status: DC

## 2022-09-24 MED ORDER — RISPERIDONE 1 MG PO TABS: 1.0000 mg | ORAL_TABLET | Freq: Two times a day (BID) | ORAL | 1 refills | Status: DC

## 2022-09-24 MED ORDER — HYDROXYZINE HCL 25 MG PO TABS: 25.0000 mg | ORAL_TABLET | Freq: Three times a day (TID) | ORAL | 1 refills | Status: DC | PRN

## 2022-09-24 MED ORDER — PAROXETINE HCL 40 MG PO TABS: 40.0000 mg | ORAL_TABLET | Freq: Every day | ORAL | 1 refills | Status: DC
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
## 2022-11-02 ENCOUNTER — Encounter (HOSPITAL_COMMUNITY): Payer: Self-pay

## 2022-11-05 ENCOUNTER — Ambulatory Visit (INDEPENDENT_AMBULATORY_CARE_PROVIDER_SITE_OTHER): Payer: MEDICAID | Admitting: Student in an Organized Health Care Education/Training Program

## 2022-11-05 ENCOUNTER — Encounter (HOSPITAL_COMMUNITY): Payer: Self-pay | Admitting: Student in an Organized Health Care Education/Training Program

## 2022-11-05 DIAGNOSIS — F1090 Alcohol use, unspecified, uncomplicated: Secondary | ICD-10-CM

## 2022-11-05 DIAGNOSIS — F411 Generalized anxiety disorder: Secondary | ICD-10-CM

## 2022-11-05 DIAGNOSIS — R4689 Other symptoms and signs involving appearance and behavior: Secondary | ICD-10-CM

## 2022-11-05 DIAGNOSIS — F429 Obsessive-compulsive disorder, unspecified: Secondary | ICD-10-CM | POA: Diagnosis not present

## 2022-11-05 DIAGNOSIS — F4310 Post-traumatic stress disorder, unspecified: Secondary | ICD-10-CM

## 2022-11-05 MED ORDER — RISPERIDONE 1 MG PO TABS
1.0000 mg | ORAL_TABLET | Freq: Two times a day (BID) | ORAL | 1 refills | Status: DC
Start: 2022-11-05 — End: 2023-01-07

## 2022-11-05 MED ORDER — NALTREXONE HCL 50 MG PO TABS
50.0000 mg | ORAL_TABLET | Freq: Every day | ORAL | 1 refills | Status: DC
Start: 2022-11-05 — End: 2023-01-07

## 2022-11-05 MED ORDER — HYDROXYZINE HCL 25 MG PO TABS
25.0000 mg | ORAL_TABLET | Freq: Three times a day (TID) | ORAL | 1 refills | Status: AC | PRN
Start: 2022-11-05 — End: ?

## 2022-11-05 MED ORDER — PAROXETINE HCL 40 MG PO TABS: 40.0000 mg | ORAL_TABLET | Freq: Every day | ORAL | 1 refills | Status: DC

## 2022-11-05 MED ORDER — PROPRANOLOL HCL 10 MG PO TABS: 30.0000 mg | ORAL_TABLET | Freq: Two times a day (BID) | ORAL | 1 refills | Status: DC
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
## 2022-11-17 ENCOUNTER — Encounter (HOSPITAL_COMMUNITY): Payer: Self-pay

## 2022-11-17 ENCOUNTER — Ambulatory Visit (HOSPITAL_COMMUNITY): Payer: MEDICAID | Admitting: Clinical

## 2022-12-09 ENCOUNTER — Ambulatory Visit: Payer: MEDICAID | Admitting: Family Medicine

## 2022-12-09 ENCOUNTER — Encounter: Payer: Self-pay | Admitting: Family Medicine

## 2022-12-09 VITALS — BP 106/72 | HR 62 | Temp 97.6°F | Ht 67.0 in | Wt 178.0 lb

## 2022-12-09 DIAGNOSIS — R7303 Prediabetes: Secondary | ICD-10-CM | POA: Insufficient documentation

## 2022-12-09 DIAGNOSIS — Z1159 Encounter for screening for other viral diseases: Secondary | ICD-10-CM

## 2022-12-09 DIAGNOSIS — R748 Abnormal levels of other serum enzymes: Secondary | ICD-10-CM

## 2022-12-09 DIAGNOSIS — I10 Essential (primary) hypertension: Secondary | ICD-10-CM | POA: Diagnosis not present

## 2022-12-09 DIAGNOSIS — F411 Generalized anxiety disorder: Secondary | ICD-10-CM

## 2022-12-09 DIAGNOSIS — Z23 Encounter for immunization: Secondary | ICD-10-CM

## 2022-12-09 LAB — CBC WITH DIFFERENTIAL/PLATELET
Basophils Absolute: 0 10*3/uL (ref 0.0–0.1)
Basophils Relative: 0.6 % (ref 0.0–3.0)
Eosinophils Absolute: 0.3 10*3/uL (ref 0.0–0.7)
Eosinophils Relative: 4.1 % (ref 0.0–5.0)
HCT: 37.4 % — ABNORMAL LOW (ref 39.0–52.0)
Hemoglobin: 12.4 g/dL — ABNORMAL LOW (ref 13.0–17.0)
Lymphocytes Relative: 31.6 % (ref 12.0–46.0)
Lymphs Abs: 2.1 10*3/uL (ref 0.7–4.0)
MCHC: 33.1 g/dL (ref 30.0–36.0)
MCV: 90.6 fL (ref 78.0–100.0)
Monocytes Absolute: 1 10*3/uL (ref 0.1–1.0)
Monocytes Relative: 14.3 % — ABNORMAL HIGH (ref 3.0–12.0)
Neutro Abs: 3.3 10*3/uL (ref 1.4–7.7)
Neutrophils Relative %: 49.4 % (ref 43.0–77.0)
Platelets: 277 10*3/uL (ref 150.0–400.0)
RBC: 4.12 Mil/uL — ABNORMAL LOW (ref 4.22–5.81)
RDW: 13.3 % (ref 11.5–15.5)
WBC: 6.7 10*3/uL (ref 4.0–10.5)

## 2022-12-09 LAB — BASIC METABOLIC PANEL
BUN: 7 mg/dL (ref 6–23)
CO2: 26 meq/L (ref 19–32)
Calcium: 9.4 mg/dL (ref 8.4–10.5)
Chloride: 104 meq/L (ref 96–112)
Creatinine, Ser: 0.8 mg/dL (ref 0.40–1.50)
GFR: 110.41 mL/min (ref >60.00)
Glucose, Bld: 89 mg/dL (ref 70–99)
Potassium: 4.4 meq/L (ref 3.5–5.1)
Sodium: 136 meq/L (ref 135–145)

## 2022-12-09 LAB — HEPATIC FUNCTION PANEL
ALT: 15 U/L (ref 0–53)
AST: 18 U/L (ref 0–37)
Albumin: 4.2 g/dL (ref 3.5–5.2)
Alkaline Phosphatase: 58 U/L (ref 39–117)
Bilirubin, Direct: 0 mg/dL (ref 0.0–0.3)
Total Bilirubin: 0.4 mg/dL (ref 0.2–1.2)
Total Protein: 7.3 g/dL (ref 6.0–8.3)

## 2022-12-09 LAB — T4, FREE: Free T4: 0.63 ng/dL (ref 0.60–1.60)

## 2022-12-09 LAB — TSH: TSH: 0.68 u[IU]/mL (ref 0.35–5.50)

## 2022-12-09 LAB — HEMOGLOBIN A1C: Hgb A1c MFr Bld: 6.2 % (ref 4.6–6.5)

## 2022-12-09 NOTE — Patient Instructions (Signed)
Please go downstairs for labs.   We will be in touch with your results and with recommendations.

## 2022-12-09 NOTE — Progress Notes (Signed)
New Patient Office Visit  Subjective    Patient ID: [name], male    DOB: 01-20-1981  Age: 42 y.o. MRN: [id number]  CC:  Chief Complaint  Patient presents with   Establish Care    No concerns, no PCP     HPI [name] presents to establish care  Under the care of Dr. Renaldo Fiddler, [facility] for GAD and other mental health conditions.  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  Hx of HTN and is taking lisinopril 20 mg.   Father has diabetes.    Single. No kids.  Currently not working.       05/17/2022   12:00 PM 05/16/2022   11:47 AM 05/15/2022    9:58 AM 05/13/2022    1:35 PM 05/10/2022    4:57 PM  Depression screen PHQ 2/9  Decreased Interest 0 0 1 0 0  Down, Depressed, Hopeless 1 1 0 0 0  PHQ - 2 Score 1 1 1  0 0  Altered sleeping 1 0 2 0 0  Tired, decreased energy 1 2 3  0 0  Change in appetite 0 1 2 0 0  Feeling bad or failure about yourself  2 3 2  0 0  Trouble concentrating 0 0 0 0 0  Moving slowly or fidgety/restless 0 0 1 0 0  Suicidal thoughts 0 0 0 0 0  PHQ-9 Score 5 7 11  0 0  Difficult doing work/chores Not difficult at all  Very difficult Not difficult at all Not difficult at all     Outpatient Encounter Medications as of 12/09/2022  Medication Sig   lisinopril (ZESTRIL) 20 MG tablet Take 1 tablet (20 mg total) by mouth daily.   naltrexone (DEPADE) 50 MG tablet Take 1 tablet (50 mg total) by mouth daily.   propranolol (INDERAL) 10 MG tablet Take 3 tablets (30 mg total) by mouth 2 (two) times daily.   risperiDONE (RISPERDAL) 1 MG tablet Take 1 tablet (1 mg total) by mouth 2 (two) times daily.   [DISCONTINUED] hydrOXYzine (ATARAX) 25 MG tablet Take 1 tablet (25 mg total) by mouth 3 (three) times daily as needed for anxiety.   [DISCONTINUED] nicotine (NICODERM CQ - DOSED IN MG/24 HOURS) 21 mg/24hr patch Place 1 patch (21 mg total) onto the skin daily at 6 (six) AM. (Patient not taking:  Reported on 12/09/2022)   [DISCONTINUED] PARoxetine (PAXIL) 40 MG tablet Take 1 tablet (40 mg total) by mouth daily. (Patient not taking: Reported on 12/09/2022)   No facility-administered encounter medications on file as of 12/09/2022.    History reviewed. No pertinent past medical history.  Past Surgical History:  Procedure Laterality Date   arm tendon surgery      History reviewed. No pertinent family history.  Social History   Socioeconomic History   Marital status: Single    Spouse name: Not on file   Number of children: Not on file   Years of education: Not on file   Highest education level: Not on file  Occupational History   Not on file  Tobacco Use   Smoking status: Every Day    Current packs/day: 0.50    Types: Cigarettes   Smokeless tobacco: Never  Substance and Sexual Activity   Alcohol use: Yes    Alcohol/week: 21.0 standard drinks of alcohol    Types: 21 Cans of beer per week   Drug use: No  Sexual activity: Not Currently  Other Topics Concern   Not on file  Social History Narrative   Not on file   Social Determinants of Health   Financial Resource Strain: High Risk (04/01/2021)   Overall Financial Resource Strain (CARDIA)    Difficulty of Paying Living Expenses: Hard  Food Insecurity: No Food Insecurity (04/01/2021)   Hunger Vital Sign    Worried About Running Out of Food in the Last Year: Never true    Ran Out of Food in the Last Year: Never true  Transportation Needs: No Transportation Needs (04/01/2021)   PRAPARE - Administrator, Civil Service (Medical): No    Lack of Transportation (Non-Medical): No  Physical Activity: Sufficiently Active (04/01/2021)   Exercise Vital Sign    Days of Exercise per Week: 7 days    Minutes of Exercise per Session: 150+ min  Stress: Stress Concern Present (04/01/2021)   [employer] of Occupational Health - Occupational Stress Questionnaire    Feeling of Stress : Very much  Social Connections:  Socially Isolated (04/01/2021)   Social Connection and Isolation Panel [NHANES]    Frequency of Communication with Friends and Family: More than three times a week    Frequency of Social Gatherings with Friends and Family: Twice a week    Attends Religious Services: Never    Database administrator or Organizations: No    Attends Banker Meetings: Never    Marital Status: Never married  Intimate Partner Violence: Not At Risk (04/01/2021)   Humiliation, Afraid, Rape, and Kick questionnaire    Fear of Current or Ex-Partner: No    Emotionally Abused: No    Physically Abused: No    Sexually Abused: No    Review of Systems  Constitutional:  Negative for chills, fever, malaise/fatigue and weight loss.  Respiratory:  Negative for shortness of breath.   Cardiovascular:  Negative for chest pain, palpitations and leg swelling.  Gastrointestinal:  Negative for abdominal pain, constipation, diarrhea, nausea and vomiting.  Genitourinary:  Negative for dysuria, frequency and urgency.  Neurological:  Negative for dizziness, focal weakness and headaches.  Psychiatric/Behavioral:  Negative for suicidal ideas. The patient is not nervous/anxious.         Objective    BP 106/72 (BP Location: Left Arm, Patient Position: Sitting, Cuff Size: Large)   Pulse 62   Temp 97.6 F (36.4 C) (Temporal)   Ht 5\' 7"  (1.702 m)   Wt 178 lb (80.7 kg)   SpO2 95%   BMI 27.88 kg/m   Physical Exam Constitutional:      General: He is not in acute distress.    Appearance: He is not ill-appearing.  Eyes:     Extraocular Movements: Extraocular movements intact.     Conjunctiva/sclera: Conjunctivae normal.  Cardiovascular:     Rate and Rhythm: Normal rate.  Pulmonary:     Effort: Pulmonary effort is normal.  Musculoskeletal:     Cervical back: Normal range of motion and neck supple.  Skin:    General: Skin is warm and dry.  Neurological:     General: No focal deficit present.     Mental Status:  He is alert and oriented to person, place, and time.  Psychiatric:        Mood and Affect: Mood normal.        Behavior: Behavior normal.        Thought Content: Thought content normal.  Assessment & Plan:   Problem List Items Addressed This Visit       Cardiovascular and Mediastinum   Hypertension   Relevant Orders   CBC with Differential/Platelet (Completed)   Basic metabolic panel (Completed)     Other   Elevated liver enzymes   Relevant Orders   Basic metabolic panel (Completed)   Hepatic function panel (Completed)   GAD (generalized anxiety disorder)   Prediabetes - Primary   Relevant Orders   CBC with Differential/Platelet (Completed)   TSH (Completed)   Hemoglobin A1c (Completed)   Basic metabolic panel (Completed)   T4, free (Completed)   Other Visit Diagnoses     Encounter for screening for other viral diseases       Relevant Orders   Hepatitis C antibody   HIV Antibody (routine testing w rflx)   Immunization due       Relevant Orders   Flu vaccine trivalent PF, 6mos and older(Flulaval,Afluria,Fluarix,Fluzone) (Completed)   Tdap vaccine greater than or equal to 7yo IM (Completed)      Here to establish care.  Reviewed notes and labs from ED and psychiatrist.  BP controlled.  Check liver function, A1c, thyroid function, and follow up.   Return for pending labs.   Hetty Blend, NP-C

## 2022-12-10 LAB — HEPATITIS C ANTIBODY: Hepatitis C Ab: NONREACTIVE

## 2022-12-10 LAB — HIV ANTIBODY (ROUTINE TESTING W REFLEX): HIV 1&2 Ab, 4th Generation: NONREACTIVE

## 2022-12-15 ENCOUNTER — Ambulatory Visit: Payer: MEDICAID | Admitting: Family Medicine

## 2022-12-16 ENCOUNTER — Encounter: Payer: Self-pay | Admitting: Family Medicine

## 2022-12-16 ENCOUNTER — Ambulatory Visit: Payer: MEDICAID | Admitting: Family Medicine

## 2022-12-16 VITALS — BP 100/74 | HR 65 | Temp 97.6°F | Ht 67.0 in | Wt 180.0 lb

## 2022-12-16 DIAGNOSIS — R5383 Other fatigue: Secondary | ICD-10-CM

## 2022-12-16 DIAGNOSIS — D649 Anemia, unspecified: Secondary | ICD-10-CM | POA: Diagnosis not present

## 2022-12-16 DIAGNOSIS — R7303 Prediabetes: Secondary | ICD-10-CM

## 2022-12-16 LAB — CBC WITH DIFFERENTIAL/PLATELET
Basophils Absolute: 0.1 10*3/uL (ref 0.0–0.1)
Basophils Relative: 0.8 % (ref 0.0–3.0)
Eosinophils Absolute: 0.2 10*3/uL (ref 0.0–0.7)
Eosinophils Relative: 3.2 % (ref 0.0–5.0)
HCT: 37.8 % — ABNORMAL LOW (ref 39.0–52.0)
Hemoglobin: 12.5 g/dL — ABNORMAL LOW (ref 13.0–17.0)
Lymphocytes Relative: 35.7 % (ref 12.0–46.0)
Lymphs Abs: 2.5 10*3/uL (ref 0.7–4.0)
MCHC: 33.2 g/dL (ref 30.0–36.0)
MCV: 90.8 fL (ref 78.0–100.0)
Monocytes Absolute: 1 10*3/uL (ref 0.1–1.0)
Monocytes Relative: 13.8 % — ABNORMAL HIGH (ref 3.0–12.0)
Neutro Abs: 3.3 10*3/uL (ref 1.4–7.7)
Neutrophils Relative %: 46.5 % (ref 43.0–77.0)
Platelets: 303 10*3/uL (ref 150.0–400.0)
RBC: 4.16 Mil/uL — ABNORMAL LOW (ref 4.22–5.81)
RDW: 13.4 % (ref 11.5–15.5)
WBC: 7.1 10*3/uL (ref 4.0–10.5)

## 2022-12-16 LAB — VITAMIN B12: Vitamin B-12: 349 pg/mL (ref 211–911)

## 2022-12-16 LAB — FOLATE: Folate: 16.8 ng/mL (ref >5.9)

## 2022-12-16 LAB — VITAMIN D 25 HYDROXY (VIT D DEFICIENCY, FRACTURES): VITD: 25.02 ng/mL — ABNORMAL LOW (ref 30.00–100.00)

## 2022-12-16 NOTE — Progress Notes (Signed)
Subjective:     Patient ID: [name], male    DOB: 08/25/1980, 42 y.o.   MRN: [id number]  Chief Complaint  Patient presents with   Anemia    Anemia      History of Present Illness          Here to follow up on abnormal labs. Anemia.  C/o fatigue.  No dizziness, chest pain, palpitations, shortness of breath.   Denies blood in stool.     There are no preventive care reminders to display for this patient.  History reviewed. No pertinent past medical history.  Past Surgical History:  Procedure Laterality Date   arm tendon surgery      History reviewed. No pertinent family history.  Social History   Socioeconomic History   Marital status: Single    Spouse name: Not on file   Number of children: Not on file   Years of education: Not on file   Highest education level: Not on file  Occupational History   Not on file  Tobacco Use   Smoking status: Every Day    Current packs/day: 0.50    Types: Cigarettes   Smokeless tobacco: Never  Substance and Sexual Activity   Alcohol use: Yes    Alcohol/week: 21.0 standard drinks of alcohol    Types: 21 Cans of beer per week   Drug use: No   Sexual activity: Not Currently  Other Topics Concern   Not on file  Social History Narrative   Not on file   Social Determinants of Health   Financial Resource Strain: High Risk (04/01/2021)   Overall Financial Resource Strain (CARDIA)    Difficulty of Paying Living Expenses: Hard  Food Insecurity: No Food Insecurity (04/01/2021)   Hunger Vital Sign    Worried About Running Out of Food in the Last Year: Never true    Ran Out of Food in the Last Year: Never true  Transportation Needs: No Transportation Needs (04/01/2021)   PRAPARE - Administrator, Civil Service (Medical): No    Lack of Transportation (Non-Medical): No  Physical Activity: Sufficiently Active (04/01/2021)   Exercise Vital Sign    Days of Exercise per Week: 7 days    Minutes of Exercise per  Session: 150+ min  Stress: Stress Concern Present (04/01/2021)   [employer] of Occupational Health - Occupational Stress Questionnaire    Feeling of Stress : Very much  Social Connections: Socially Isolated (04/01/2021)   Social Connection and Isolation Panel [NHANES]    Frequency of Communication with Friends and Family: More than three times a week    Frequency of Social Gatherings with Friends and Family: Twice a week    Attends Religious Services: Never    Database administrator or Organizations: No    Attends Banker Meetings: Never    Marital Status: Never married  Intimate Partner Violence: Not At Risk (04/01/2021)   Humiliation, Afraid, Rape, and Kick questionnaire    Fear of Current or Ex-Partner: No    Emotionally Abused: No    Physically Abused: No    Sexually Abused: No    Outpatient Medications Prior to Visit  Medication Sig Dispense Refill   lisinopril (ZESTRIL) 20 MG tablet Take 1 tablet (20 mg total) by mouth daily. 30 tablet 1   naltrexone (DEPADE) 50 MG tablet Take 1 tablet (50 mg total) by mouth daily. 30 tablet 1   propranolol (INDERAL) 10 MG tablet Take 3 tablets (  30 mg total) by mouth 2 (two) times daily. 180 tablet 1   risperiDONE (RISPERDAL) 1 MG tablet Take 1 tablet (1 mg total) by mouth 2 (two) times daily. 60 tablet 1   No facility-administered medications prior to visit.    No Known Allergies  ROS     Objective:    Physical Exam Constitutional:      General: He is not in acute distress.    Appearance: He is not ill-appearing.  Eyes:     Extraocular Movements: Extraocular movements intact.     Conjunctiva/sclera: Conjunctivae normal.  Cardiovascular:     Rate and Rhythm: Normal rate.  Pulmonary:     Effort: Pulmonary effort is normal.  Musculoskeletal:     Cervical back: Normal range of motion and neck supple.  Skin:    General: Skin is warm and dry.  Neurological:     General: No focal deficit present.     Mental  Status: He is alert and oriented to person, place, and time.  Psychiatric:        Mood and Affect: Mood normal.        Behavior: Behavior normal.        Thought Content: Thought content normal.      BP 100/74 (BP Location: Left Arm, Patient Position: Sitting, Cuff Size: Large)   Pulse 65   Temp 97.6 F (36.4 C) (Temporal)   Ht 5\' 7"  (1.702 m)   Wt 180 lb (81.6 kg)   SpO2 95%   BMI 28.19 kg/m  Wt Readings from Last 3 Encounters:  12/16/22 180 lb (81.6 kg)  12/09/22 178 lb (80.7 kg)       Assessment & Plan:   Problem List Items Addressed This Visit       Other   Prediabetes   Other Visit Diagnoses     Anemia, unspecified type    -  Primary   Relevant Orders   CBC with Differential/Platelet   Iron, TIBC and Ferritin Panel   Folate   Vitamin B12   Fatigue, unspecified type       Relevant Orders   CBC with Differential/Platelet   Iron, TIBC and Ferritin Panel   Folate   Vitamin B12   VITAMIN D 25 Hydroxy (Vit-D Deficiency, Fractures)      Reviewed labs with him.  Anemia- normocytic normochromic.  Check labs to look for etiology.  He reports fatigue.  Continue MVI.  Discussed possible referral to GI if no explanation.   I am having Bradley John A. [name] maintain his lisinopril, naltrexone, propranolol, and risperiDONE.  No orders of the defined types were placed in this encounter.

## 2022-12-17 ENCOUNTER — Other Ambulatory Visit: Payer: Self-pay | Admitting: Family Medicine

## 2022-12-17 DIAGNOSIS — E559 Vitamin D deficiency, unspecified: Secondary | ICD-10-CM | POA: Insufficient documentation

## 2022-12-17 DIAGNOSIS — D649 Anemia, unspecified: Secondary | ICD-10-CM | POA: Insufficient documentation

## 2022-12-17 LAB — IRON,TIBC AND FERRITIN PANEL
%SAT: 22 % (ref 20–48)
Ferritin: 70 ng/mL (ref 38–380)
Iron: 75 ug/dL (ref 50–180)
TIBC: 346 ug/dL (ref 250–425)

## 2022-12-21 ENCOUNTER — Encounter: Payer: Self-pay | Admitting: Gastroenterology

## 2023-01-03 ENCOUNTER — Encounter (HOSPITAL_COMMUNITY): Payer: Self-pay

## 2023-01-03 ENCOUNTER — Ambulatory Visit (INDEPENDENT_AMBULATORY_CARE_PROVIDER_SITE_OTHER): Payer: MEDICAID | Admitting: Clinical

## 2023-01-03 DIAGNOSIS — F411 Generalized anxiety disorder: Secondary | ICD-10-CM

## 2023-01-03 NOTE — Progress Notes (Unsigned)
   THERAPIST PROGRESS NOTE  Session Time: 30 minutes  Participation Level: Active  Behavioral Response: CasualAlertAnxious  Type of Therapy: Individual Therapy  Treatment Goals addressed: Patient will complete at least 80% of assigned homework   ProgressTowards Goals: Initial  Interventions: CBT and Supportive  Summary:  [name] is a 42 y.o. male who presents for initial appointment to engage in outpatient counseling. Client presents oriented times five, appropriately dressed and friendly. Client denied hallucinations and delusions. Client reported he is already established with [facility] psychiatry and being treated for GAD,PTSD, and unspecified OCD. Client is presenting following initial voluntary admittance to [facility] urgent care for alcohol detox. Client reported he is not a good historian for time lines but it has been noted in his chart that he reported to a [facility] provider he completed residential treatment at [facility] in [place]. Client reported following tx he is not connected with any substance use counseling or support groups. 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 Client reported as of currently his anxiety is still present made evident by the lack of being social interaction and overall worrying without being provoked/triggered. Client reported he has medication for PTSD which is working well and he does not have nightmares.Client reported the OCD he has let go of doing certain habits he previously had related to counting, even and odd numbers. Client reported irritability is still present infrequently but not to the point of aggression. Client reported he has maintained sobriety and has not used alcohol since April 2024. Evidence of progress towards goal:  client reported he has 0 identified goals in place to work toward in therapy. Client reported he is medication compliant 7 days per week.  Suicidal/Homicidal: Nowithout intent/plan  Therapist  Response:  Therapist began the appointment discussing confidentiality and introductions. Therapist used cbt to engage using active listening and positive emotional support. Therapist used cbt to ask the client about previous MH/SA history and treatments completed. Therapist used cbt to ask the client about his current symptoms compared to severity of symptoms that continue to persist.  Therapist used cbt to ask the client about his goals for treatment and motivation for when he is ready to begim the therapy process. Therapist engage the client in discussing goal formation and follow up appointments.   Plan: Client reported he is in agreement to return to therapy once he can identify goals he would like to work towards.  Diagnosis: GAD  Collaboration of Care: Patient refused AEB no other needs identified by the client at this time.  Patient/Guardian was advised Release of Information must be obtained prior to any record release in order to collaborate their care with an outside provider. Patient/Guardian was advised if they have not already done so to contact the registration department to sign all necessary forms in order for [place] to release information regarding their care.   Consent: Patient/Guardian gives verbal consent for treatment and assignment of benefits for services provided during this visit. Patient/Guardian expressed understanding and agreed to proceed.   Neena Rhymes Jahn Franchini, LCSW 01/03/2023

## 2023-01-07 ENCOUNTER — Ambulatory Visit (INDEPENDENT_AMBULATORY_CARE_PROVIDER_SITE_OTHER): Payer: MEDICAID | Admitting: Student in an Organized Health Care Education/Training Program

## 2023-01-07 ENCOUNTER — Encounter (HOSPITAL_COMMUNITY): Payer: Self-pay | Admitting: Student in an Organized Health Care Education/Training Program

## 2023-01-07 VITALS — BP 119/83 | HR 72 | Ht 67.0 in | Wt 173.6 lb

## 2023-01-07 DIAGNOSIS — F1090 Alcohol use, unspecified, uncomplicated: Secondary | ICD-10-CM | POA: Diagnosis not present

## 2023-01-07 DIAGNOSIS — F411 Generalized anxiety disorder: Secondary | ICD-10-CM | POA: Diagnosis not present

## 2023-01-07 DIAGNOSIS — F4310 Post-traumatic stress disorder, unspecified: Secondary | ICD-10-CM | POA: Diagnosis not present

## 2023-01-07 DIAGNOSIS — R4689 Other symptoms and signs involving appearance and behavior: Secondary | ICD-10-CM | POA: Diagnosis not present

## 2023-01-07 MED ORDER — NALTREXONE HCL 50 MG PO TABS
50.0000 mg | ORAL_TABLET | Freq: Every day | ORAL | 1 refills | Status: DC
Start: 2023-01-07 — End: 2023-03-04

## 2023-01-07 MED ORDER — PROPRANOLOL HCL 10 MG PO TABS: 30.0000 mg | ORAL_TABLET | Freq: Two times a day (BID) | ORAL | 1 refills | Status: DC

## 2023-01-07 MED ORDER — PAROXETINE HCL 40 MG PO TABS
40.0000 mg | ORAL_TABLET | Freq: Every day | ORAL | 1 refills | Status: DC
Start: 2023-01-07 — End: 2023-03-04

## 2023-01-07 MED ORDER — RISPERIDONE 1 MG PO TABS
1.0000 mg | ORAL_TABLET | Freq: Two times a day (BID) | ORAL | 1 refills | Status: DC
Start: 2023-01-07 — End: 2023-03-04

## 2023-01-07 NOTE — Progress Notes (Signed)
BH MD/PA/NP OP Progress Note  01/07/2023 11:50 AM [name]  MRN:  [id number]  Chief Complaint:  Chief Complaint  Patient presents with   Follow-up   Anxiety   HPI:  [name] is a 42 yr old male who presents for Follow Up and Medication Management.  PPHx is significant for GAD, OCD, Insomnia, Chronic PTSD, EtOH Use Disorder, Bipolar Disorder, SAD, and ADHD, and 1 Suicide Attempt (cut self with knife, teenager), past Psychiatric Hospitalizations (last- [facility] 2018), and has been to [facility] 06/2022), and no history of Self Injurious Behavior.    He reports things have been better since his last appointment.  He reports that he did go to his father's in [place] to go fishing for a week and this was very restorative for him.  He reports that he has been doing better about the passing of his friend.  When questioned if he was still taking his Paxil as it had been removed from his medication list he confirms he is still taking it daily.  He reports he has not needed his hydroxyzine in a while so discussed stopping it and he was agreeable with this.  Discussed with him that we would not make any other changes to his medication at this time he was agreeable with this.  He reports no SI, HI, or AVH.  He reports his sleep is good.  He reports appetite is doing good.  He reports no other concerns at present.  He will return for follow-up in approximately 2 months.   Visit Diagnosis:    ICD-10-CM   1. GAD (generalized anxiety disorder)  F41.1 risperiDONE (RISPERDAL) 1 MG tablet    propranolol (INDERAL) 10 MG tablet    PARoxetine (PAXIL) 40 MG tablet    2. Alcohol use disorder  F10.90 naltrexone (DEPADE) 50 MG tablet    3. PTSD (post-traumatic stress disorder)  F43.10 risperiDONE (RISPERDAL) 1 MG tablet    propranolol (INDERAL) 10 MG tablet    PARoxetine (PAXIL) 40 MG tablet    4. Aggression  R46.89 risperiDONE (RISPERDAL) 1 MG tablet         Past Psychiatric History: GAD,  OCD, Insomnia, Chronic PTSD, EtOH Use Disorder, Bipolar Disorder, SAD, and ADHD, 1 Suicide Attempt (cut self with knife, teenager), past Psychiatric Hospitalizations (latest [facility] 2018).   Past Medical History: No past medical history on file.  Past Surgical History:  Procedure Laterality Date   arm tendon surgery      Family Psychiatric History: Father- Cocaine Use No Known Diagnosis' or Suicides  Family History: No family history on file.  Social History:  Social History   Socioeconomic History   Marital status: Single    Spouse name: Not on file   Number of children: Not on file   Years of education: Not on file   Highest education level: Not on file  Occupational History   Not on file  Tobacco Use   Smoking status: Every Day    Current packs/day: 0.50    Types: Cigarettes   Smokeless tobacco: Never  Substance and Sexual Activity   Alcohol use: Yes    Alcohol/week: 21.0 standard drinks of alcohol    Types: 21 Cans of beer per week   Drug use: No   Sexual activity: Not Currently  Other Topics Concern   Not on file  Social History Narrative   Not on file   Social Determinants of Health   Financial Resource Strain: High Risk (04/01/2021)  Overall Financial Resource Strain (CARDIA)    Difficulty of Paying Living Expenses: Hard  Food Insecurity: No Food Insecurity (04/01/2021)   Hunger Vital Sign    Worried About Running Out of Food in the Last Year: Never true    Ran Out of Food in the Last Year: Never true  Transportation Needs: No Transportation Needs (04/01/2021)   PRAPARE - Administrator, Civil Service (Medical): No    Lack of Transportation (Non-Medical): No  Physical Activity: Sufficiently Active (04/01/2021)   Exercise Vital Sign    Days of Exercise per Week: 7 days    Minutes of Exercise per Session: 150+ min  Stress: Stress Concern Present (04/01/2021)   [employer] of Occupational Health - Occupational Stress Questionnaire    Feeling of  Stress : Very much  Social Connections: Socially Isolated (04/01/2021)   Social Connection and Isolation Panel [NHANES]    Frequency of Communication with Friends and Family: More than three times a week    Frequency of Social Gatherings with Friends and Family: Twice a week    Attends Religious Services: Never    Database administrator or Organizations: No    Attends Engineer, structural: Never    Marital Status: Never married    Allergies: No Known Allergies  Metabolic Disorder Labs: Lab Results  Component Value Date   HGBA1C 6.2 12/09/2022   MPG 120 05/10/2022   No results found for: "PROLACTIN" Lab Results  Component Value Date   CHOL 248 (H) 05/10/2022   TRIG 69 05/10/2022   HDL 110 05/10/2022   CHOLHDL 2.3 05/10/2022   VLDL 14 05/10/2022   LDLCALC 124 (H) 05/10/2022   LDLCALC 80 12/29/2021   Lab Results  Component Value Date   TSH 0.68 12/09/2022   TSH 0.472 05/10/2022    Therapeutic Level Labs: No results found for: "LITHIUM" No results found for: "VALPROATE" No results found for: "CBMZ"  Current Medications: Current Outpatient Medications  Medication Sig Dispense Refill   PARoxetine (PAXIL) 40 MG tablet Take 1 tablet (40 mg total) by mouth daily. 30 tablet 1   lisinopril (ZESTRIL) 20 MG tablet Take 1 tablet (20 mg total) by mouth daily. 30 tablet 1   naltrexone (DEPADE) 50 MG tablet Take 1 tablet (50 mg total) by mouth daily. 30 tablet 1   propranolol (INDERAL) 10 MG tablet Take 3 tablets (30 mg total) by mouth 2 (two) times daily. 180 tablet 1   risperiDONE (RISPERDAL) 1 MG tablet Take 1 tablet (1 mg total) by mouth 2 (two) times daily. 60 tablet 1   No current facility-administered medications for this visit.     Musculoskeletal: Strength & Muscle Tone: within normal limits Gait & Station: normal Patient leans: N/A  Psychiatric Specialty Exam: Review of Systems  Respiratory:  Negative for cough and shortness of breath.   Cardiovascular:   Negative for chest pain.  Gastrointestinal:  Negative for abdominal pain, constipation, diarrhea, nausea and vomiting.  Neurological:  Negative for dizziness, weakness and headaches.  Psychiatric/Behavioral:  Negative for dysphoric mood, hallucinations, sleep disturbance and suicidal ideas. The patient is not nervous/anxious.     Blood pressure 119/83, pulse 72, height 5\' 7"  (1.702 m), weight 173 lb 9.6 oz (78.7 kg), SpO2 99%.Body mass index is 27.19 kg/m.  General Appearance: Casual and Fairly Groomed  Eye Contact:  Good  Speech:  Clear and Coherent and Normal Rate  Volume:  Normal  Mood:  Euthymic  Affect:  Appropriate and  Congruent  Thought Process:  Coherent and Goal Directed  Orientation:  Full (Time, Place, and Person)  Thought Content: WDL and Logical   Suicidal Thoughts:  No  Homicidal Thoughts:  No  Memory:  Immediate;   Good Recent;   Good  Judgement:  Good  Insight:  Good  Psychomotor Activity:  Normal  Concentration:  Concentration: Good and Attention Span: Good  Recall:  Good  Fund of Knowledge: Good  Language: Good  Akathisia:  Negative  Handed:  Right  AIMS (if indicated): not done  Assets:  Communication Skills Desire for Improvement Housing Physical Health Resilience Social Support  ADL's:  Intact  Cognition: WNL  Sleep:  Good   Screenings: AIMS    Flowsheet Row [facility]  AIMS Total Score 0      AUDIT    Flowsheet Row Counselor from 04/01/2021 in [facility]  Alcohol Use Disorder Identification Test Final Score (AUDIT) 13 9      GAD-7    Flowsheet Row Counselor from 11/10/2021 in [facility] Counselor from 09/23/2021 in [facility] Counselor from 09/01/2021 in [facility] Counselor from 08/04/2021 in [facility] Counselor from 07/07/2021 in [facility]  Total GAD-7 Score 8 5 9 6 18       PHQ2-9    Flowsheet Row Office Visit from 12/16/2022 in [facility] from 05/10/2022 in [facility] Counselor from 11/10/2021 in [facility] Counselor from 09/23/2021 in [facility] Counselor from 09/01/2021 in [facility]  PHQ-2 Total Score 0 1 0 0 0  PHQ-9 Total Score -- 5 3 5 3       Flowsheet Row ED from 05/10/2022 in [facility] Counselor from 09/23/2021 in [facility] Counselor from 04/01/2021 in [facility]  C-SSRS RISK CATEGORY No Risk No Risk No Risk        Assessment and Plan:  Oswaldo Cueto is a 42 yr old male who presents for Follow Up and Medication Management.  PPHx is significant for GAD, OCD, Insomnia, Chronic PTSD, EtOH Use Disorder, Bipolar Disorder, SAD, and ADHD, and 1 Suicide Attempt (cut self with knife, teenager), past Psychiatric Hospitalizations (last- [facility] 2018), and has been to [facility] 06/2022), and no history of Self Injurious Behavior.    Lora has been doing better since his last appointment after the recent passing of his friend.  He has continued to do well on his current medication regimen so we will not make any changes except for stopping the hydroxyzine which he has not needed now for a while.  Refills were sent in.  He will return for follow-up in approximately 2 months.   OCD  GAD  Chronic PTSD  Bipolar Disorder by Hx : -Continue Paxil 40 mg daily for anxiety and OCD. 30 tablets with 1 refill -Continue Propanolol 30 mg BID for anxiety and HTN. 180 (10 mg) tablets with 1 refill -Continue Risperdal 1 mg BID for mood stability. 60  tablets with 1 refill. -Stop Hydroxyzine     HTN: -Continue Lisinopril 20 mg daily.     EtOH Use Disorder: -Completed stay at [facility] -Continue Naltrexone 50 mg daily.  30 tablets with 1 refill.    Collaboration of Care:   Patient/Guardian was advised Release of Information must be obtained prior to any record release in order to collaborate their care with an outside provider. Patient/Guardian was advised if they have not already done so to contact the registration department to sign all necessary forms in order for [place] to release information regarding their care.   Consent: Patient/Guardian gives verbal consent for treatment and assignment of benefits for services provided during this visit. Patient/Guardian expressed understanding and agreed to proceed.    Lauro Franklin, MD 01/07/2023, 11:50 AM

## 2023-02-15 ENCOUNTER — Telehealth (HOSPITAL_COMMUNITY): Payer: Self-pay

## 2023-02-15 NOTE — Telephone Encounter (Signed)
Medication refill request - Fax from patient's [facility] for a new Lisinopril 20 mg order, last provided 07/02/22 + 1 refill. Patient last seen 01/07/23 and returns next on 03/04/23.

## 2023-02-18 ENCOUNTER — Telehealth (HOSPITAL_COMMUNITY): Payer: Self-pay

## 2023-02-18 ENCOUNTER — Telehealth: Payer: Self-pay | Admitting: Family Medicine

## 2023-02-18 DIAGNOSIS — I10 Essential (primary) hypertension: Secondary | ICD-10-CM

## 2023-02-18 MED ORDER — LISINOPRIL 20 MG PO TABS: 20.0000 mg | ORAL_TABLET | Freq: Every day | ORAL | 1 refills | Status: DC

## 2023-02-18 NOTE — Telephone Encounter (Signed)
Medication refill request - Patient called with request for a new Lisinopril order be sent in by Dr. Renaldo Fiddler, last ordered 07/02/22 + 1 refill. Patient stated he had been without this for "awhile" but would like Dr. Renaldo Fiddler to authorize a new order. Patients next appointment set for 03/04/23.

## 2023-02-18 NOTE — Telephone Encounter (Signed)
Patient's other provider requested that his PCP start filling this medication.   Prescription Request  02/18/2023  LOV: 12/16/2022  What is the name of the medication or equipment?  lisinopril (ZESTRIL) 20 MG tablet  Have you contacted your pharmacy to request a refill? Yes   Which pharmacy would you like this sent to?  [facility] [id number] [place], [place] - [address] [place] [address] Phone: [phone] Fax: [phone]    Patient notified that their request is being sent to the clinical staff for review and that they should receive a response within 2 business days.   Please advise at Mobile [phone] (mobile)

## 2023-02-18 NOTE — Telephone Encounter (Signed)
Medication management - Called patient to inform that now he is established with a PCP, he would need to request his refills of Lisinopril medication through them. Patient stated understanding and will call his PCPs office with request.

## 2023-02-18 NOTE — Addendum Note (Signed)
Addended by: Marinus Maw on: 02/18/2023 11:38 AM   Modules accepted: Orders

## 2023-02-18 NOTE — Telephone Encounter (Signed)
This will be 1st fill w you, ok to refill?

## 2023-02-19 NOTE — Telephone Encounter (Signed)
Received request to refill patient's lisinopril.  Per chart review refill was sent in on 12/13 by his PCP Hetty Blend NP-C.  We will not send in a refill at this time as it is already been taken care of.    Arna Snipe MD Resident

## 2023-03-04 ENCOUNTER — Encounter (HOSPITAL_COMMUNITY): Payer: Self-pay | Admitting: Student in an Organized Health Care Education/Training Program

## 2023-03-04 ENCOUNTER — Ambulatory Visit (INDEPENDENT_AMBULATORY_CARE_PROVIDER_SITE_OTHER): Payer: MEDICAID | Admitting: Student in an Organized Health Care Education/Training Program

## 2023-03-04 DIAGNOSIS — F4310 Post-traumatic stress disorder, unspecified: Secondary | ICD-10-CM

## 2023-03-04 DIAGNOSIS — R4689 Other symptoms and signs involving appearance and behavior: Secondary | ICD-10-CM

## 2023-03-04 DIAGNOSIS — F1090 Alcohol use, unspecified, uncomplicated: Secondary | ICD-10-CM | POA: Diagnosis not present

## 2023-03-04 DIAGNOSIS — F411 Generalized anxiety disorder: Secondary | ICD-10-CM

## 2023-03-04 MED ORDER — RISPERIDONE 1 MG PO TABS
1.0000 mg | ORAL_TABLET | Freq: Two times a day (BID) | ORAL | 1 refills | Status: DC
Start: 2023-03-04 — End: 2023-04-29

## 2023-03-04 MED ORDER — PROPRANOLOL HCL 10 MG PO TABS: 30.0000 mg | ORAL_TABLET | Freq: Two times a day (BID) | ORAL | 1 refills | Status: DC

## 2023-03-04 MED ORDER — NALTREXONE HCL 50 MG PO TABS: 50.0000 mg | ORAL_TABLET | Freq: Every day | ORAL | 1 refills | Status: DC

## 2023-03-04 MED ORDER — PAROXETINE HCL 40 MG PO TABS: 40.0000 mg | ORAL_TABLET | Freq: Every day | ORAL | 1 refills | Status: DC

## 2023-03-04 NOTE — Progress Notes (Signed)
BH MD/PA/NP OP Progress Note  03/04/2023 12:37 PM XZAVIAN GAMBALE  MRN:  [id number]  Chief Complaint:  Chief Complaint  Patient presents with   Follow-up   Medication Refill   HPI:  Arda Cesar is a 42 yr old male who presents for Follow Up and Medication Management.  PPHx is significant for GAD, OCD, Insomnia, Chronic PTSD, EtOH Use Disorder, Bipolar Disorder, SAD, and ADHD, and 1 Suicide Attempt (cut self with knife, teenager), past Psychiatric Hospitalizations (last- [facility] 2018), and has been to [facility] 06/2022), and no history of Self Injurious Behavior.    He reports he has been doing fine since her last appointment.  He reports Christmas was a little rough because he and his sister are still not really speaking.  He reports him and his brother tomorrow are good to be hanging out and going to [employer] all day.  He reports that he is continuing to process the death of his friend but overall is doing okay.  He reports he feels like his medications are controlling his symptoms well and does not feel like anything needs to be changed at this time.  He reports no SI, HI, or AVH.  He reports his sleep is good.  He reports appetite is doing good.  He reports no other concerns at present.  He return follow-up approximately 2 months.   Visit Diagnosis:    ICD-10-CM   1. GAD (generalized anxiety disorder)  F41.1 risperiDONE (RISPERDAL) 1 MG tablet    propranolol (INDERAL) 10 MG tablet    PARoxetine (PAXIL) 40 MG tablet    2. PTSD (post-traumatic stress disorder)  F43.10 risperiDONE (RISPERDAL) 1 MG tablet    propranolol (INDERAL) 10 MG tablet    PARoxetine (PAXIL) 40 MG tablet    3. Aggression  R46.89 risperiDONE (RISPERDAL) 1 MG tablet    4. Alcohol use disorder  F10.90 naltrexone (DEPADE) 50 MG tablet          Past Psychiatric History: GAD, OCD, Insomnia, Chronic PTSD, EtOH Use Disorder, Bipolar Disorder, SAD, and ADHD, 1 Suicide Attempt (cut self with knife,  teenager), past Psychiatric Hospitalizations (latest [facility] 2018).   Past Medical History: No past medical history on file.  Past Surgical History:  Procedure Laterality Date   arm tendon surgery      Family Psychiatric History: Father- Cocaine Use No Known Diagnosis' or Suicides  Family History: No family history on file.  Social History:  Social History   Socioeconomic History   Marital status: Single    Spouse name: Not on file   Number of children: Not on file   Years of education: Not on file   Highest education level: Not on file  Occupational History   Not on file  Tobacco Use   Smoking status: Every Day    Current packs/day: 0.50    Types: Cigarettes   Smokeless tobacco: Never  Substance and Sexual Activity   Alcohol use: Yes    Alcohol/week: 21.0 standard drinks of alcohol    Types: 21 Cans of beer per week   Drug use: No   Sexual activity: Not Currently  Other Topics Concern   Not on file  Social History Narrative   Not on file   Social Drivers of Health   Financial Resource Strain: High Risk (04/01/2021)   Overall Financial Resource Strain (CARDIA)    Difficulty of Paying Living Expenses: Hard  Food Insecurity: No Food Insecurity (04/01/2021)   Hunger Vital Sign  Worried About Programme researcher, broadcasting/film/video in the Last Year: Never true    Ran Out of Food in the Last Year: Never true  Transportation Needs: No Transportation Needs (04/01/2021)   PRAPARE - Administrator, Civil Service (Medical): No    Lack of Transportation (Non-Medical): No  Physical Activity: Sufficiently Active (04/01/2021)   Exercise Vital Sign    Days of Exercise per Week: 7 days    Minutes of Exercise per Session: 150+ min  Stress: Stress Concern Present (04/01/2021)   [employer] of Occupational Health - Occupational Stress Questionnaire    Feeling of Stress : Very much  Social Connections: Socially Isolated (04/01/2021)   Social Connection and Isolation Panel [NHANES]     Frequency of Communication with Friends and Family: More than three times a week    Frequency of Social Gatherings with Friends and Family: Twice a week    Attends Religious Services: Never    Database administrator or Organizations: No    Attends Engineer, structural: Never    Marital Status: Never married    Allergies: No Known Allergies  Metabolic Disorder Labs: Lab Results  Component Value Date   HGBA1C 6.2 12/09/2022   MPG 120 05/10/2022   No results found for: "PROLACTIN" Lab Results  Component Value Date   CHOL 248 (H) 05/10/2022   TRIG 69 05/10/2022   HDL 110 05/10/2022   CHOLHDL 2.3 05/10/2022   VLDL 14 05/10/2022   LDLCALC 124 (H) 05/10/2022   LDLCALC 80 12/29/2021   Lab Results  Component Value Date   TSH 0.68 12/09/2022   TSH 0.472 05/10/2022    Therapeutic Level Labs: No results found for: "LITHIUM" No results found for: "VALPROATE" No results found for: "CBMZ"  Current Medications: Current Outpatient Medications  Medication Sig Dispense Refill   lisinopril (ZESTRIL) 20 MG tablet Take 1 tablet (20 mg total) by mouth daily. 90 tablet 1   naltrexone (DEPADE) 50 MG tablet Take 1 tablet (50 mg total) by mouth daily. 30 tablet 1   PARoxetine (PAXIL) 40 MG tablet Take 1 tablet (40 mg total) by mouth daily. 30 tablet 1   propranolol (INDERAL) 10 MG tablet Take 3 tablets (30 mg total) by mouth 2 (two) times daily. 180 tablet 1   risperiDONE (RISPERDAL) 1 MG tablet Take 1 tablet (1 mg total) by mouth 2 (two) times daily. 60 tablet 1   No current facility-administered medications for this visit.     Musculoskeletal: Strength & Muscle Tone: within normal limits Gait & Station: normal Patient leans: N/A  Psychiatric Specialty Exam: Review of Systems  Respiratory:  Negative for cough and shortness of breath.   Cardiovascular:  Negative for chest pain.  Gastrointestinal:  Negative for abdominal pain, constipation, diarrhea, nausea and vomiting.   Neurological:  Negative for weakness and headaches.  Psychiatric/Behavioral:  Negative for dysphoric mood, hallucinations, sleep disturbance and suicidal ideas. The patient is not nervous/anxious.     Blood pressure 134/83, pulse 66, height 5\' 7"  (1.702 m), weight 177 lb 12.8 oz (80.6 kg), SpO2 99%.Body mass index is 27.85 kg/m.  General Appearance: Casual and Fairly Groomed  Eye Contact:  Good  Speech:  Clear and Coherent and Normal Rate  Volume:  Normal  Mood:  Euthymic  Affect:  Appropriate and Congruent  Thought Process:  Coherent and Goal Directed  Orientation:  Full (Time, Place, and Person)  Thought Content: WDL and Logical   Suicidal Thoughts:  No  Homicidal Thoughts:  No  Memory:  Immediate;   Good Recent;   Good  Judgement:  Good  Insight:  Good  Psychomotor Activity:  Normal  Concentration:  Concentration: Good and Attention Span: Good  Recall:  Good  Fund of Knowledge: Good  Language: Good  Akathisia:  Negative  Handed:  Right  AIMS (if indicated): not done  Assets:  Communication Skills Desire for Improvement Housing Physical Health Resilience Social Support  ADL's:  Intact  Cognition: WNL  Sleep:  Good   Screenings: AIMS    Flowsheet Row [facility]  AIMS Total Score 0      AUDIT    Flowsheet Row Counselor from 04/01/2021 in [facility] Ad[facility] (Dis[facility]  Alcohol Use Disorder Identification Test Final Score (AUDIT) 13 9      GAD-7    Flowsheet Row Counselor from 11/10/2021 in [facility] Counselor from 09/23/2021 in [facility] Counselor from 09/01/2021 in [facility] Counselor from 08/04/2021 in [facility] Counselor from 07/07/2021 in [facility]  Total GAD-7 Score 8 5 9 6 18       PHQ2-9    Flowsheet Row Office Visit from 12/16/2022 in [facility] ED from 05/10/2022 in [facility] Counselor from 11/10/2021 in [facility] Counselor from 09/23/2021 in [facility] Counselor from 09/01/2021 in [facility]  PHQ-2 Total Score 0 1 0 0 0  PHQ-9 Total Score -- 5 3 5 3       Flowsheet Row ED from 05/10/2022 in [facility] Counselor from 09/23/2021 in [facility] Counselor from 04/01/2021 in [facility]  C-SSRS RISK CATEGORY No Risk No Risk No Risk        Assessment and Plan:  Iosif Surace is a 42 yr old male who presents for Follow Up and Medication Management.  PPHx is significant for GAD, OCD, Insomnia, Chronic PTSD, EtOH Use Disorder, Bipolar Disorder, SAD, and ADHD, and 1 Suicide Attempt (cut self with knife, teenager), past Psychiatric Hospitalizations (last- [facility] 2018), and has been to [facility] 06/2022), and no history of Self Injurious Behavior.    Clemons has continued to do well on his current medication regimen.  We will not make any changes to his medication at this time.  Refills were sent in.  He will return follow-up in approximately 2 months.   OCD  GAD  Chronic PTSD  Bipolar Disorder by Hx : -Continue Paxil 40 mg daily for anxiety and OCD. 30 tablets with 1 refill -Continue Propanolol 30 mg BID for anxiety and HTN. 180 (10 mg) tablets with 1 refill -Continue Risperdal 1 mg BID for mood stability. 60 tablets with 1 refill.   EtOH Use Disorder: -Completed stay at [facility] -Continue Naltrexone 50 mg daily.  30 tablets with 1 refill.    Collaboration of Care:   Patient/Guardian was advised Release of Information must be obtained prior to any record release in order  to collaborate their care with an outside provider. Patient/Guardian was advised if they have not already done so to contact the registration department to sign all necessary forms in order for [place] to release information regarding their care.  Consent: Patient/Guardian gives verbal consent for treatment and assignment of benefits for services provided during this visit. Patient/Guardian expressed understanding and agreed to proceed.    Lauro Franklin, MD 03/04/2023, 12:37 PM

## 2023-03-23 ENCOUNTER — Ambulatory Visit: Payer: MEDICAID | Admitting: Gastroenterology

## 2023-03-23 ENCOUNTER — Other Ambulatory Visit (INDEPENDENT_AMBULATORY_CARE_PROVIDER_SITE_OTHER): Payer: MEDICAID

## 2023-03-23 ENCOUNTER — Encounter: Payer: Self-pay | Admitting: Gastroenterology

## 2023-03-23 VITALS — BP 120/70 | HR 67 | Ht 67.0 in | Wt 180.4 lb

## 2023-03-23 DIAGNOSIS — D649 Anemia, unspecified: Secondary | ICD-10-CM | POA: Diagnosis not present

## 2023-03-23 LAB — COMPREHENSIVE METABOLIC PANEL
ALT: 139 U/L — ABNORMAL HIGH (ref 0–53)
AST: 42 U/L — ABNORMAL HIGH (ref 0–37)
Albumin: 4.7 g/dL (ref 3.5–5.2)
Alkaline Phosphatase: 68 U/L (ref 39–117)
BUN: 21 mg/dL (ref 6–23)
CO2: 30 meq/L (ref 19–32)
Calcium: 10 mg/dL (ref 8.4–10.5)
Chloride: 98 meq/L (ref 96–112)
Creatinine, Ser: 0.97 mg/dL (ref 0.40–1.50)
GFR: 97.2 mL/min (ref >60.00)
Glucose, Bld: 71 mg/dL (ref 70–99)
Potassium: 5.1 meq/L (ref 3.5–5.1)
Sodium: 134 meq/L — ABNORMAL LOW (ref 135–145)
Total Bilirubin: 0.4 mg/dL (ref 0.2–1.2)
Total Protein: 7.9 g/dL (ref 6.0–8.3)

## 2023-03-23 LAB — CBC WITH DIFFERENTIAL/PLATELET
Basophils Absolute: 0.1 10*3/uL (ref 0.0–0.1)
Basophils Relative: 0.8 % (ref 0.0–3.0)
Eosinophils Absolute: 0.2 10*3/uL (ref 0.0–0.7)
Eosinophils Relative: 2.1 % (ref 0.0–5.0)
HCT: 39 % (ref 39.0–52.0)
Hemoglobin: 13.2 g/dL (ref 13.0–17.0)
Lymphocytes Relative: 25.1 % (ref 12.0–46.0)
Lymphs Abs: 2.2 10*3/uL (ref 0.7–4.0)
MCHC: 33.8 g/dL (ref 30.0–36.0)
MCV: 91.3 fL (ref 78.0–100.0)
Monocytes Absolute: 1 10*3/uL (ref 0.1–1.0)
Monocytes Relative: 11.4 % (ref 3.0–12.0)
Neutro Abs: 5.4 10*3/uL (ref 1.4–7.7)
Neutrophils Relative %: 60.6 % (ref 43.0–77.0)
Platelets: 311 10*3/uL (ref 150.0–400.0)
RBC: 4.28 Mil/uL (ref 4.22–5.81)
RDW: 13 % (ref 11.5–15.5)
WBC: 8.9 10*3/uL (ref 4.0–10.5)

## 2023-03-23 LAB — IBC + FERRITIN
Ferritin: 134 ng/mL (ref 22.0–322.0)
Iron: 94 ug/dL (ref 42–165)
Saturation Ratios: 23.4 % (ref 20.0–50.0)
TIBC: 401.8 ug/dL (ref 250.0–450.0)
Transferrin: 287 mg/dL (ref 212.0–360.0)

## 2023-03-23 LAB — C-REACTIVE PROTEIN: CRP: <1 mg/dL (ref 0.5–20.0)

## 2023-03-23 NOTE — Patient Instructions (Signed)
 Your provider has requested that you go to the basement level for lab work before leaving today. Press "B" on the elevator. The lab is located at the first door on the left as you exit the elevator.  _______________________________________________________  If your blood pressure at your visit was 140/90 or greater, please contact your primary care physician to follow up on this.  _______________________________________________________  If you are age 42 or older, your body mass index should be between 23-30. Your Body mass index is 28.25 kg/m. If this is out of the aforementioned range listed, please consider follow up with your Primary Care Provider.  If you are age 77 or younger, your body mass index should be between 19-25. Your Body mass index is 28.25 kg/m. If this is out of the aformentioned range listed, please consider follow up with your Primary Care Provider.   ________________________________________________________  The [place] [facility] providers would like to encourage you to use MYCHART to communicate with providers for non-urgent requests or questions.  Due to long hold times on the telephone, sending your provider a message by [facility] may be a faster and more efficient way to get a response.  Please allow 48 business hours for a response.  Please remember that this is for non-urgent requests.   It was a pleasure to see you today!  Thank you for trusting me with your gastrointestinal care!    Scott E.Cherryl Corona, MD

## 2023-03-23 NOTE — Progress Notes (Signed)
 HPI : [name] is a 43 y.o. male with a history of anxiety, OCD, PTSD, bipolar disorder, ADHD and alcohol use disorder who is referred to us  by Abram Abraham, NP-C for further evaluation of normocytic anemia.   The patient's most recent CBC was December 16, 2022, with a hemoglobin of 12.5, MCV 91, platelets 303.  In March 2024 his hemoglobin was 15.9 with MCV 94.  His baseline hemoglobin seems to be between 15-14. An iron panel in October 2024 was normal, with serum iron 75, TIBC 346, saturation 22%, ferritin 70.  B12 levels also normal.  Renal function normal, TSH normal.  The patient denied any gastrointestinal symptoms such as loss of appetite, nausea, vomiting, or weight loss recently. He also denied any abdominal pain, changes in bowel movements, or presence of blood or black tarry stools. The patient reported a slight weight gain, attributed to medication.  The patient's diet was reported to be balanced, including fruits and vegetables. He had a history of alcohol use disorder but had been abstinent for almost a year.  He underwent inpatient alcohol rehab last March.    The patient denied any family history of gastrointestinal cancers or chronic gastrointestinal diseases.   He was on a regimen of multivitamins and naloxone for alcohol cravings, with no reported side effects. The patient denied any regular use of over-the-counter medications like Motrin , Aleve, or naproxen.       Past Medical History:  Diagnosis Date   Alcoholism (HCC)    Anemia    Anxiety    Depression      Past Surgical History:  Procedure Laterality Date   arm tendon surgery     Family History  Problem Relation Age of Onset   Colon cancer Neg Hx    Stomach cancer Neg Hx    Esophageal cancer Neg Hx    Social History   Tobacco Use   Smoking status: Former    Current packs/day: 0.50    Types: Cigarettes   Smokeless tobacco: Never  Vaping Use   Vaping status: Never Used  Substance Use Topics    Alcohol use: Not Currently    Alcohol/week: 21.0 standard drinks of alcohol    Types: 21 Cans of beer per week   Drug use: No   Current Outpatient Medications  Medication Sig Dispense Refill   lisinopril  (ZESTRIL ) 20 MG tablet Take 1 tablet (20 mg total) by mouth daily. 90 tablet 1   naltrexone  (DEPADE) 50 MG tablet Take 1 tablet (50 mg total) by mouth daily. 30 tablet 1   PARoxetine  (PAXIL ) 40 MG tablet Take 1 tablet (40 mg total) by mouth daily. 30 tablet 1   propranolol  (INDERAL ) 10 MG tablet Take 3 tablets (30 mg total) by mouth 2 (two) times daily. 180 tablet 1   risperiDONE  (RISPERDAL ) 1 MG tablet Take 1 tablet (1 mg total) by mouth 2 (two) times daily. 60 tablet 1   No current facility-administered medications for this visit.   No Known Allergies   Review of Systems: All systems reviewed and negative except where noted in HPI.    No results found.  Physical Exam: BP 120/70   Pulse 67   Ht 5\' 7"  (1.702 m)   Wt 180 lb 6 oz (81.8 kg)   BMI 28.25 kg/m  Constitutional: Pleasant,well-developed, Caucasian male in no acute distress. HEENT: Normocephalic and atraumatic. Conjunctivae are normal. No scleral icterus. Neck supple.  Cardiovascular: Normal rate, regular rhythm.  Pulmonary/chest: Effort normal  and breath sounds normal. No wheezing, rales or rhonchi. Abdominal: Soft, nondistended, nontender. Bowel sounds active throughout. There are no masses palpable. No hepatomegaly. Extremities: no edema Lymphadenopathy: No cervical adenopathy noted. Neurological: Alert and oriented to person place and time. Skin: Skin is warm and dry. No rashes noted. Psychiatric: Normal mood and affect. Behavior is normal.  CBC    Component Value Date/Time   WBC 7.1 12/16/2022 1501   RBC 4.16 (L) 12/16/2022 1501   HGB 12.5 (L) 12/16/2022 1501   HGB 15.4 12/29/2021 0000   HCT 37.8 (L) 12/16/2022 1501   HCT 45.1 12/29/2021 0000   PLT 303.0 12/16/2022 1501   PLT 230 12/29/2021 0000    MCV 90.8 12/16/2022 1501   MCV 95 12/29/2021 0000   MCH 33.5 05/10/2022 1536   MCHC 33.2 12/16/2022 1501   RDW 13.4 12/16/2022 1501   RDW 12.9 12/29/2021 0000   LYMPHSABS 2.5 12/16/2022 1501   LYMPHSABS 1.5 12/29/2021 0000   MONOABS 1.0 12/16/2022 1501   EOSABS 0.2 12/16/2022 1501   EOSABS 0.2 12/29/2021 0000   BASOSABS 0.1 12/16/2022 1501   BASOSABS 0.1 12/29/2021 0000    CMP     Component Value Date/Time   NA 136 12/09/2022 1411   NA 139 12/29/2021 0000   K 4.4 12/09/2022 1411   CL 104 12/09/2022 1411   CO2 26 12/09/2022 1411   GLUCOSE 89 12/09/2022 1411   BUN 7 12/09/2022 1411   BUN 7 12/29/2021 0000   CREATININE 0.80 12/09/2022 1411   CALCIUM 9.4 12/09/2022 1411   PROT 7.3 12/09/2022 1411   PROT 6.9 12/29/2021 0000   ALBUMIN 4.2 12/09/2022 1411   ALBUMIN 4.2 12/29/2021 0000   AST 18 12/09/2022 1411   ALT 15 12/09/2022 1411   ALKPHOS 58 12/09/2022 1411   BILITOT 0.4 12/09/2022 1411   BILITOT <0.2 12/29/2021 0000   GFRNONAA >60 05/13/2022 1451   GFRAA >60 01/25/2017 2207       Latest Ref Rng & Units 12/16/2022    3:01 PM 12/09/2022    2:11 PM 05/10/2022    3:36 PM  CBC EXTENDED  WBC 4.0 - 10.5 K/uL 7.1  6.7  6.5   RBC 4.22 - 5.81 Mil/uL 4.16  4.12  4.75   Hemoglobin 13.0 - 17.0 g/dL 40.9  81.1  91.4   HCT 39.0 - 52.0 % 37.8  37.4  44.5   Platelets 150.0 - 400.0 K/uL 303.0  277.0  245   NEUT# 1.4 - 7.7 K/uL 3.3  3.3  3.3   Lymph# 0.7 - 4.0 K/uL 2.5  2.1  2.2       ASSESSMENT AND PLAN:   43 year old male with recent October CBC showing slightly low hemoglobin level at 12.5, down from baseline 14-15.  Normocytic and with normal iron panel.  No history of overt GI bleeding.  No other concerning GI symptoms.  Overall, very low suspicion for chronic GI blood loss.  Etiology for mild anemia not clear.  We discussed proceeding with endoscopic evaluation now, versus repeating CBC, iron panel and checking for fecal occult blood.  We agreed to proceed with repeating  labs at this time.  If his FOBT is positive, then we will proceed with a colonoscopy.  If his hemoglobin is still below baseline, but iron panel and FOBT are unremarkable, then I think potentially referring to hematology would be reasonable, versus further observation.  Anemia, normocytic - Repeat CBC, iron panel - Fecal occult blood test (FIT) -  Colonoscopy if FOBT positive, or repeat CBC/iron panel consistent with iron deficiency anemia - Consider hematology referral if hemoglobin still low, but iron panel/FOBT normal  Jameer Storie E. Cherryl Corona, MD [facility]   Abram Abraham, NP-C

## 2023-03-28 ENCOUNTER — Encounter: Payer: Self-pay | Admitting: Gastroenterology

## 2023-03-28 NOTE — Progress Notes (Signed)
[name], Your blood counts and iron panel look great.  You do not have iron deficiency.  You are not anemic.  Still awaiting the test for occult blood in the stool. Your liver enzymes were elevated again.  If you are drinking, I would strongly recommend you stop drinking.  I would like to repeat your liver enzymes in 1 month.  If they are still elevated, we will need to do further evaluation.  Bonita Quin, can you please place an order for a CMP and GGT in 1 month?

## 2023-03-29 ENCOUNTER — Other Ambulatory Visit: Payer: Self-pay

## 2023-03-29 DIAGNOSIS — R7989 Other specified abnormal findings of blood chemistry: Secondary | ICD-10-CM

## 2023-04-05 ENCOUNTER — Other Ambulatory Visit (INDEPENDENT_AMBULATORY_CARE_PROVIDER_SITE_OTHER): Payer: MEDICAID

## 2023-04-05 DIAGNOSIS — D649 Anemia, unspecified: Secondary | ICD-10-CM | POA: Diagnosis not present

## 2023-04-05 LAB — FECAL OCCULT BLOOD, IMMUNOCHEMICAL: Fecal Occult Bld: NEGATIVE

## 2023-04-06 ENCOUNTER — Encounter: Payer: Self-pay | Admitting: Gastroenterology

## 2023-04-06 NOTE — Progress Notes (Signed)
[name], Your fecal occult blood test was negative.  Based on your normal iron levels, normalization of your hemoglobin and negative fecal occult blood test, I do not think we need to pursue an endoscopic evaluation.  Excessive alcohol use can cause drops in hemoglobin due to effects on the bone marrow. Please plan to repeat liver enzymes in 1 month and stop drinking as discussed.  Please let me know if you have any further questions/concerns.

## 2023-04-29 ENCOUNTER — Encounter (HOSPITAL_COMMUNITY): Payer: Self-pay | Admitting: Student in an Organized Health Care Education/Training Program

## 2023-04-29 ENCOUNTER — Ambulatory Visit (INDEPENDENT_AMBULATORY_CARE_PROVIDER_SITE_OTHER): Payer: MEDICAID | Admitting: Student in an Organized Health Care Education/Training Program

## 2023-04-29 DIAGNOSIS — F1090 Alcohol use, unspecified, uncomplicated: Secondary | ICD-10-CM | POA: Diagnosis not present

## 2023-04-29 DIAGNOSIS — F411 Generalized anxiety disorder: Secondary | ICD-10-CM | POA: Diagnosis not present

## 2023-04-29 DIAGNOSIS — R4689 Other symptoms and signs involving appearance and behavior: Secondary | ICD-10-CM | POA: Diagnosis not present

## 2023-04-29 DIAGNOSIS — F4310 Post-traumatic stress disorder, unspecified: Secondary | ICD-10-CM

## 2023-04-29 MED ORDER — RISPERIDONE 1 MG PO TABS
1.0000 mg | ORAL_TABLET | Freq: Two times a day (BID) | ORAL | 1 refills | Status: DC
Start: 2023-04-29 — End: 2023-06-24

## 2023-04-29 MED ORDER — PROPRANOLOL HCL 10 MG PO TABS: 30.0000 mg | ORAL_TABLET | Freq: Two times a day (BID) | ORAL | 1 refills | Status: DC

## 2023-04-29 MED ORDER — NALTREXONE HCL 50 MG PO TABS: 50.0000 mg | ORAL_TABLET | Freq: Every day | ORAL | 1 refills | Status: DC

## 2023-04-29 MED ORDER — PAROXETINE HCL 20 MG PO TABS
60.0000 mg | ORAL_TABLET | Freq: Every day | ORAL | 1 refills | Status: DC
Start: 2023-04-29 — End: 2023-05-06

## 2023-04-29 NOTE — Progress Notes (Signed)
BH MD/PA/NP OP Progress Note  04/29/2023 12:11 PM [name]  MRN:  [id number]  Chief Complaint:  Chief Complaint  Patient presents with   Follow-up   Depression   Anxiety   HPI:  [name] is a 43 yr old male who presents for Follow Up and Medication Management.  PPHx is significant for GAD, OCD, Insomnia, Chronic PTSD, EtOH Use Disorder, Bipolar Disorder, SAD, and ADHD, and 1 Suicide Attempt (cut self with knife, teenager), past Psychiatric Hospitalizations (last- [facility] 2018), and has been to [facility]), and no history of Self Injurious Behavior.    He reports he has been doing worse since his last appointment.  He reports that he has been much more down and depressed.  He reports that he has been trying to counter this by working out but it has not been helping.  He reports that 1 morning he woke up feeling like something awful it happened and just began to cry.  He reports that he always gets sad during the winter and that it is happened every year for as long as he can remember.  Discussed with him that this is a specific form of depression called SAD.   Discussed with him that we could address this by increasing his Paxil as it has been helping with his symptoms so far.  Discussed with him that if this does not help we would transition to a medication called Wellbutrin as that is first line treatment for SAD.  He reported understanding and was agreeable with the plan.  He reports no side effects to his medications.  He reports his sleep is good.  He reports appetite is doing good.  He reports no SI, HI, or AVH.  He reports no other concerns at present.  He will return for follow-up in approximately 2 months.   Visit Diagnosis:    ICD-10-CM   1. GAD (generalized anxiety disorder)  F41.1 PARoxetine (PAXIL) 20 MG tablet    propranolol (INDERAL) 10 MG tablet    risperiDONE (RISPERDAL) 1 MG tablet    2. PTSD (post-traumatic stress disorder)  F43.10 PARoxetine (PAXIL)  20 MG tablet    propranolol (INDERAL) 10 MG tablet    risperiDONE (RISPERDAL) 1 MG tablet    3. Alcohol use disorder  F10.90 naltrexone (DEPADE) 50 MG tablet    4. Aggression  R46.89 risperiDONE (RISPERDAL) 1 MG tablet           Past Psychiatric History: GAD, OCD, Insomnia, Chronic PTSD, EtOH Use Disorder, Bipolar Disorder, SAD, and ADHD, 1 Suicide Attempt (cut self with knife, teenager), past Psychiatric Hospitalizations (latest [facility] 2018).   Past Medical History:  Past Medical History:  Diagnosis Date   Alcoholism (HCC)    Anemia    Anxiety    Depression     Past Surgical History:  Procedure Laterality Date   arm tendon surgery      Family Psychiatric History: Father- Cocaine Use No Known Diagnosis' or Suicides  Family History:  Family History  Problem Relation Age of Onset   Colon cancer Neg Hx    Stomach cancer Neg Hx    Esophageal cancer Neg Hx     Social History:  Social History   Socioeconomic History   Marital status: Single    Spouse name: Not on file   Number of children: Not on file   Years of education: Not on file   Highest education level: Not on file  Occupational History  Not on file  Tobacco Use   Smoking status: Former    Current packs/day: 0.50    Types: Cigarettes   Smokeless tobacco: Never  Vaping Use   Vaping status: Never Used  Substance and Sexual Activity   Alcohol use: Not Currently    Alcohol/week: 21.0 standard drinks of alcohol    Types: 21 Cans of beer per week   Drug use: No   Sexual activity: Not Currently  Other Topics Concern   Not on file  Social History Narrative   Not on file   Social Drivers of Health   Financial Resource Strain: High Risk (04/01/2021)   Overall Financial Resource Strain (CARDIA)    Difficulty of Paying Living Expenses: Hard  Food Insecurity: No Food Insecurity (04/01/2021)   Hunger Vital Sign    Worried About Running Out of Food in the Last Year: Never true    Ran Out of Food in the  Last Year: Never true  Transportation Needs: No Transportation Needs (04/01/2021)   PRAPARE - Administrator, Civil Service (Medical): No    Lack of Transportation (Non-Medical): No  Physical Activity: Sufficiently Active (04/01/2021)   Exercise Vital Sign    Days of Exercise per Week: 7 days    Minutes of Exercise per Session: 150+ min  Stress: Stress Concern Present (04/01/2021)   [employer] of Occupational Health - Occupational Stress Questionnaire    Feeling of Stress : Very much  Social Connections: Socially Isolated (04/01/2021)   Social Connection and Isolation Panel [NHANES]    Frequency of Communication with Friends and Family: More than three times a week    Frequency of Social Gatherings with Friends and Family: Twice a week    Attends Religious Services: Never    Database administrator or Organizations: No    Attends Engineer, structural: Never    Marital Status: Never married    Allergies: No Known Allergies  Metabolic Disorder Labs: Lab Results  Component Value Date   HGBA1C 6.2 12/09/2022   MPG 120 05/10/2022   No results found for: "PROLACTIN" Lab Results  Component Value Date   CHOL 248 (H) 05/10/2022   TRIG 69 05/10/2022   HDL 110 05/10/2022   CHOLHDL 2.3 05/10/2022   VLDL 14 05/10/2022   LDLCALC 124 (H) 05/10/2022   LDLCALC 80 12/29/2021   Lab Results  Component Value Date   TSH 0.68 12/09/2022   TSH 0.472 05/10/2022    Therapeutic Level Labs: No results found for: "LITHIUM" No results found for: "VALPROATE" No results found for: "CBMZ"  Current Medications: Current Outpatient Medications  Medication Sig Dispense Refill   lisinopril (ZESTRIL) 20 MG tablet Take 1 tablet (20 mg total) by mouth daily. 90 tablet 1   naltrexone (DEPADE) 50 MG tablet Take 1 tablet (50 mg total) by mouth daily. 30 tablet 1   PARoxetine (PAXIL) 20 MG tablet Take 3 tablets (60 mg total) by mouth daily. 90 tablet 1   propranolol (INDERAL) 10 MG  tablet Take 3 tablets (30 mg total) by mouth 2 (two) times daily. 180 tablet 1   risperiDONE (RISPERDAL) 1 MG tablet Take 1 tablet (1 mg total) by mouth 2 (two) times daily. 60 tablet 1   No current facility-administered medications for this visit.     Musculoskeletal: Strength & Muscle Tone: within normal limits Gait & Station: normal Patient leans: N/A  Psychiatric Specialty Exam: Review of Systems  Respiratory:  Negative for cough and shortness  of breath.   Cardiovascular:  Negative for chest pain.  Gastrointestinal:  Negative for abdominal pain, constipation, diarrhea, nausea and vomiting.  Neurological:  Negative for weakness and headaches.  Psychiatric/Behavioral:  Positive for dysphoric mood. Negative for hallucinations, sleep disturbance and suicidal ideas. The patient is not nervous/anxious.     Blood pressure 124/77, pulse 66, height 5\' 7"  (1.702 m), weight 177 lb 12.8 oz (80.6 kg), SpO2 98%.Body mass index is 27.85 kg/m.  General Appearance: Casual and Fairly Groomed  Eye Contact:  Good  Speech:  Clear and Coherent and Normal Rate  Volume:  Normal  Mood:  Dysphoric  Affect:  Congruent and Depressed  Thought Process:  Coherent and Goal Directed  Orientation:  Full (Time, Place, and Person)  Thought Content: WDL and Logical   Suicidal Thoughts:  No  Homicidal Thoughts:  No  Memory:  Immediate;   Good Recent;   Good  Judgement:  Good  Insight:  Good  Psychomotor Activity:  Normal  Concentration:  Concentration: Good and Attention Span: Good  Recall:  Good  Fund of Knowledge: Good  Language: Good  Akathisia:  Negative  Handed:  Right  AIMS (if indicated): not done  Assets:  Communication Skills Desire for Improvement Housing Physical Health Resilience Social Support  ADL's:  Intact  Cognition: WNL  Sleep:  Good   Screenings: AIMS    Flowsheet Row [facility]  AIMS Total Score  0      AUDIT    Flowsheet Row Counselor from 04/01/2021 in [facility]Dis[facility]  Alcohol Use Disorder Identification Test Final Score (AUDIT) 13 9      GAD-7    Flowsheet Row Counselor from 11/10/2021 in [facility] Counselor from 09/23/2021 in [facility] Counselor from 09/01/2021 in [facility] Counselor from 08/04/2021 in [facility] Counselor from 07/07/2021 in [facility]  Total GAD-7 Score 8 5 9 6 18       PHQ2-9    Flowsheet Row Office Visit from 12/16/2022 in [facility] [place] [facility] from 05/10/2022 in [facility] Counselor from 11/10/2021 in [facility] Counselor from 09/23/2021 in [facility] Counselor from 09/01/2021 in [facility]  PHQ-2 Total Score 0 1 0 0 0  PHQ-9 Total Score -- 5 3 5 3       Flowsheet Row ED from 05/10/2022 in [facility] Counselor from 09/23/2021 in [facility] Counselor from 04/01/2021 in [facility]  C-SSRS RISK CATEGORY No Risk No Risk No Risk        Assessment and Plan:  [name] is a 43 yr old male who presents for Follow Up and Medication Management.  PPHx is significant for GAD, OCD, Insomnia, Chronic PTSD, EtOH Use Disorder, Bipolar Disorder, SAD, and ADHD, and 1 Suicide Attempt (cut self with knife, teenager), past Psychiatric Hospitalizations (last- [facility] 2018), and has been to [facility] 06/2022), and no history of Self Injurious Behavior.    [name] has had significant worsening of his depression.  As it is happened for as long as he can remember at winter he does meet  criteria for SAD.  As he has had improvement on Paxil we  will first trial and increase, however, if his symptoms do not improve we will consider switching to Wellbutrin as that is FDA approved for the treatment of SAD.  We will not make any other changes to his medications at this time.  Refills were sent in.  He will return for follow-up in approximately 2 months.   OCD  GAD  Chronic PTSD  Bipolar Disorder by Hx : -Increase Paxil to 60 mg daily for anxiety and OCD.  90 (20 mg) tablets with 1 refill. -Continue Propanolol 30 mg BID for anxiety and HTN. 180 (10 mg) tablets with 1 refill -Continue Risperdal 1 mg BID for mood stability. 60 tablets with 1 refill.   EtOH Use Disorder: -Completed stay at [facility] -Continue Naltrexone 50 mg daily.  30 tablets with 1 refill.    Collaboration of Care:   Patient/Guardian was advised Release of Information must be obtained prior to any record release in order to collaborate their care with an outside provider. Patient/Guardian was advised if they have not already done so to contact the registration department to sign all necessary forms in order for [place] to release information regarding their care.   Consent: Patient/Guardian gives verbal consent for treatment and assignment of benefits for services provided during this visit. Patient/Guardian expressed understanding and agreed to proceed.    Lauro Franklin, MD 04/29/2023, 12:11 PM

## 2023-05-06 ENCOUNTER — Other Ambulatory Visit (INDEPENDENT_AMBULATORY_CARE_PROVIDER_SITE_OTHER): Payer: MEDICAID

## 2023-05-06 ENCOUNTER — Telehealth (HOSPITAL_COMMUNITY): Payer: Self-pay

## 2023-05-06 DIAGNOSIS — F411 Generalized anxiety disorder: Secondary | ICD-10-CM

## 2023-05-06 DIAGNOSIS — F4310 Post-traumatic stress disorder, unspecified: Secondary | ICD-10-CM

## 2023-05-06 DIAGNOSIS — R7989 Other specified abnormal findings of blood chemistry: Secondary | ICD-10-CM

## 2023-05-06 LAB — GAMMA GT: GGT: 14 U/L (ref 7–51)

## 2023-05-06 LAB — COMPREHENSIVE METABOLIC PANEL
ALT: 43 U/L (ref 0–53)
AST: 23 U/L (ref 0–37)
Albumin: 4.3 g/dL (ref 3.5–5.2)
Alkaline Phosphatase: 59 U/L (ref 39–117)
BUN: 15 mg/dL (ref 6–23)
CO2: 30 meq/L (ref 19–32)
Calcium: 9.4 mg/dL (ref 8.4–10.5)
Chloride: 104 meq/L (ref 96–112)
Creatinine, Ser: 0.91 mg/dL (ref 0.40–1.50)
GFR: 104.85 mL/min (ref >60.00)
Glucose, Bld: 79 mg/dL (ref 70–99)
Potassium: 4.8 meq/L (ref 3.5–5.1)
Sodium: 140 meq/L (ref 135–145)
Total Bilirubin: 0.3 mg/dL (ref 0.2–1.2)
Total Protein: 7.3 g/dL (ref 6.0–8.3)

## 2023-05-06 MED ORDER — PAROXETINE HCL 20 MG PO TABS: 60.0000 mg | ORAL_TABLET | Freq: Every day | ORAL | 1 refills | Status: DC

## 2023-05-06 NOTE — Addendum Note (Signed)
 Addended by: Lauro Franklin on: 05/06/2023 11:12 AM   Modules accepted: Orders

## 2023-05-06 NOTE — Telephone Encounter (Signed)
 Received message that patient needed refill of his Paxil sent in.  This was sent.   Sent: -Paxil 60 mg daily.  90 (20 mg) tablets with 1 refill.    Arna Snipe MD Resident

## 2023-05-06 NOTE — Telephone Encounter (Addendum)
 Hello,    Pt/ Pharmacy is requesting for a refill of PARoxetine (PAXIL) 20 MG tablets to be sent in.

## 2023-05-09 ENCOUNTER — Encounter: Payer: Self-pay | Admitting: Gastroenterology

## 2023-05-09 NOTE — Progress Notes (Signed)
 [name],  Your liver enzymes improved significantly.  Let's check them again in 6 months to make sure they remain normal.  Will also repeat a CBC again to make sure your blood counts (hemoglobin) have not dropped again.  [name],  Please place reminder for CMP, GGT and CBC in 6 months

## 2023-06-24 ENCOUNTER — Ambulatory Visit (INDEPENDENT_AMBULATORY_CARE_PROVIDER_SITE_OTHER): Payer: MEDICAID | Admitting: Student in an Organized Health Care Education/Training Program

## 2023-06-24 ENCOUNTER — Telehealth (HOSPITAL_COMMUNITY): Payer: Self-pay

## 2023-06-24 DIAGNOSIS — R4689 Other symptoms and signs involving appearance and behavior: Secondary | ICD-10-CM | POA: Diagnosis not present

## 2023-06-24 DIAGNOSIS — F4310 Post-traumatic stress disorder, unspecified: Secondary | ICD-10-CM

## 2023-06-24 DIAGNOSIS — F411 Generalized anxiety disorder: Secondary | ICD-10-CM

## 2023-06-24 DIAGNOSIS — F1090 Alcohol use, unspecified, uncomplicated: Secondary | ICD-10-CM | POA: Diagnosis not present

## 2023-06-24 MED ORDER — PAROXETINE HCL 20 MG PO TABS: 60.0000 mg | ORAL_TABLET | Freq: Every day | ORAL | 1 refills | Status: DC

## 2023-06-24 MED ORDER — RISPERIDONE 1 MG PO TABS: 1.0000 mg | ORAL_TABLET | Freq: Two times a day (BID) | ORAL | 1 refills | Status: DC

## 2023-06-24 MED ORDER — NALTREXONE HCL 50 MG PO TABS: 50.0000 mg | ORAL_TABLET | Freq: Every day | ORAL | 1 refills | Status: DC

## 2023-06-24 MED ORDER — PROPRANOLOL HCL 10 MG PO TABS
30.0000 mg | ORAL_TABLET | Freq: Two times a day (BID) | ORAL | 1 refills | Status: AC
Start: 2023-06-24 — End: ?

## 2023-06-24 NOTE — Progress Notes (Signed)
 BH MD/PA/NP OP Progress Note  06/24/2023 11:23 AM [name]  MRN:  [id number]  Chief Complaint:  Chief Complaint  Patient presents with   Follow-up   Medication Refill   HPI:  [name] is a 43 yr old male who presents for Follow Up and Medication Management.  PPHx is significant for GAD, OCD, Insomnia, Chronic PTSD, EtOH Use Disorder, Bipolar Disorder, SAD, and ADHD, and 1 Suicide Attempt (cut self with knife, teenager), past Psychiatric Hospitalizations (last- [facility] 2018), and has been to [facility] 06/2022), and no history of Self Injurious Behavior.    He reports he has been doing better since his last appointment.  He reports that the increase in Paxil  was helpful.  He reports [facility]  He reports that he has also been doing light therapy.  He reports that with his improvement in mood he has begun running and has been successful in losing some weight which he is happy about.  Discussed with him that since he is doing better we would not make any changes to his medication at this time he was agreeable with this.  He reports no SI, HI, or AVH.  He reports his sleep is good.  He reports appetite is doing good.  He reports no other concerns at present.  He will return for follow-up in approximately 6 weeks.   Visit Diagnosis:    ICD-10-CM   1. GAD (generalized anxiety disorder)  F41.1 risperiDONE  (RISPERDAL ) 1 MG tablet    propranolol  (INDERAL ) 10 MG tablet    PARoxetine  (PAXIL ) 20 MG tablet    2. PTSD (post-traumatic stress disorder)  F43.10 risperiDONE  (RISPERDAL ) 1 MG tablet    propranolol  (INDERAL ) 10 MG tablet    PARoxetine  (PAXIL ) 20 MG tablet    3. Aggression  R46.89 risperiDONE  (RISPERDAL ) 1 MG tablet    4. Alcohol use disorder  F10.90 naltrexone  (DEPADE) 50 MG tablet            Past Psychiatric History: GAD, OCD, Insomnia, Chronic PTSD, EtOH Use Disorder, Bipolar Disorder, SAD, and ADHD, 1 Suicide  Attempt (cut self with knife, teenager), past Psychiatric Hospitalizations (latest [facility] 2018).   Past Medical History:  Past Medical History:  Diagnosis Date   Alcoholism (HCC)    Anemia    Anxiety    Depression     Past Surgical History:  Procedure Laterality Date   arm tendon surgery      Family Psychiatric History: Father- Cocaine Use No Known Diagnosis' or Suicides  Family History:  Family History  Problem Relation Age of Onset   Colon cancer Neg Hx    Stomach cancer Neg Hx    Esophageal cancer Neg Hx     Social History:  Social History   Socioeconomic History   Marital status: Single    Spouse name: Not on file   Number of children: Not on file   Years of education: Not on file   Highest education level: Not on file  Occupational History   Not on file  Tobacco Use   Smoking status: Former    Current packs/day: 0.50    Types: Cigarettes   Smokeless tobacco: Never  Vaping Use   Vaping status: Never Used  Substance and Sexual Activity   Alcohol use: Not Currently    Alcohol/week: 21.0 standard drinks of alcohol    Types: 21 Cans of beer per week   Drug use: No   Sexual  activity: Not Currently  Other Topics Concern   Not on file  Social History Narrative   Not on file   Social Drivers of Health   Financial Resource Strain: High Risk (04/01/2021)   Overall Financial Resource Strain (CARDIA)    Difficulty of Paying Living Expenses: Hard  Food Insecurity: No Food Insecurity (04/01/2021)   Hunger Vital Sign    Worried About Running Out of Food in the Last Year: Never true    Ran Out of Food in the Last Year: Never true  Transportation Needs: No Transportation Needs (04/01/2021)   PRAPARE - Administrator, Civil Service (Medical): No    Lack of Transportation (Non-Medical): No  Physical Activity: Sufficiently Active (04/01/2021)   Exercise Vital Sign    Days of Exercise per Week: 7 days    Minutes of Exercise per Session: 150+ min  Stress:  Stress Concern Present (04/01/2021)   [employer] of Occupational Health - Occupational Stress Questionnaire    Feeling of Stress : Very much  Social Connections: Socially Isolated (04/01/2021)   Social Connection and Isolation Panel [NHANES]    Frequency of Communication with Friends and Family: More than three times a week    Frequency of Social Gatherings with Friends and Family: Twice a week    Attends Religious Services: Never    Database Administrator or Organizations: No    Attends Engineer, Structural: Never    Marital Status: Never married    Allergies: No Known Allergies  Metabolic Disorder Labs: Lab Results  Component Value Date   HGBA1C 6.2 12/09/2022   MPG 120 05/10/2022   No results found for: PROLACTIN Lab Results  Component Value Date   CHOL 248 (H) 05/10/2022   TRIG 69 05/10/2022   HDL 110 05/10/2022   CHOLHDL 2.3 05/10/2022   VLDL 14 05/10/2022   LDLCALC 124 (H) 05/10/2022   LDLCALC 80 12/29/2021   Lab Results  Component Value Date   TSH 0.68 12/09/2022   TSH 0.472 05/10/2022    Therapeutic Level Labs: No results found for: LITHIUM No results found for: VALPROATE No results found for: CBMZ  Current Medications: Current Outpatient Medications  Medication Sig Dispense Refill   lisinopril  (ZESTRIL ) 20 MG tablet Take 1 tablet (20 mg total) by mouth daily. 90 tablet 1   naltrexone  (DEPADE) 50 MG tablet Take 1 tablet (50 mg total) by mouth daily. 30 tablet 1   PARoxetine  (PAXIL ) 20 MG tablet Take 3 tablets (60 mg total) by mouth daily. 90 tablet 1   propranolol  (INDERAL ) 10 MG tablet Take 3 tablets (30 mg total) by mouth 2 (two) times daily. 180 tablet 1   risperiDONE  (RISPERDAL ) 1 MG tablet Take 1 tablet (1 mg total) by mouth 2 (two) times daily. 60 tablet 1   No current facility-administered medications for this visit.     Musculoskeletal: Strength & Muscle Tone: within normal limits Gait & Station: normal Patient leans:  N/A  Psychiatric Specialty Exam: Review of Systems  Respiratory:  Negative for cough and shortness of breath.   Cardiovascular:  Negative for chest pain.  Gastrointestinal:  Negative for abdominal pain, constipation, diarrhea, nausea and vomiting.  Neurological:  Negative for weakness and headaches.  Psychiatric/Behavioral:  Negative for dysphoric mood, hallucinations and suicidal ideas. The patient is not nervous/anxious.     Blood pressure 127/81, pulse 64, height 5' 7 (1.702 m), weight 154 lb 9.6 oz (70.1 kg), SpO2 99%.Body mass index is 24.21 kg/m.  General Appearance: Casual and Fairly Groomed  Eye Contact:  Good  Speech:  Clear and Coherent and Normal Rate  Volume:  Normal  Mood:   Good  Affect:  Congruent and Depressed  Thought Process:  Coherent and Goal Directed  Orientation:  Full (Time, Place, and Person)  Thought Content: WDL and Logical   Suicidal Thoughts:  No  Homicidal Thoughts:  No  Memory:  Immediate;   Good Recent;   Good  Judgement:  Good  Insight:  Good  Psychomotor Activity:  Normal  Concentration:  Concentration: Good and Attention Span: Good  Recall:  Good  Fund of Knowledge: Good  Language: Good  Akathisia:  Negative  Handed:  Right  AIMS (if indicated): not done  Assets:  Communication Skills Desire for Improvement Housing Physical Health Resilience Social Support  ADL's:  Intact  Cognition: WNL  Sleep:  Good   Screenings: AIMS    Flowsheet Row [facility]  AIMS Total Score 0      AUDIT    Flowsheet Row Counselor from 04/01/2021 in [facility]  Alcohol Use Disorder Identification Test Final Score (AUDIT) 13 9      GAD-7    Flowsheet Row Counselor from 11/10/2021 in [facility] Counselor from 09/23/2021 in  [facility] Counselor from 09/01/2021 in [facility] Counselor from 08/04/2021 in [facility] Counselor from 07/07/2021 in [facility]  Total GAD-7 Score 8 5 9 6 18       PHQ2-9    Flowsheet Row Office Visit from 12/16/2022 in [facility] from 05/10/2022 in [facility] Counselor from 11/10/2021 in [facility] Counselor from 09/23/2021 in [facility] Counselor from 09/01/2021 in [facility]  PHQ-2 Total Score 0 1 0 0 0  PHQ-9 Total Score -- 5 3 5 3       Flowsheet Row ED from 05/10/2022 in [facility] Counselor from 09/23/2021 in [facility] Counselor from 04/01/2021 in [facility]  C-SSRS RISK CATEGORY No Risk No Risk No Risk        Assessment and Plan:  Shem Plemmons is a 43 yr old male who presents for Follow Up and Medication Management.  PPHx is significant for GAD, OCD, Insomnia, Chronic PTSD, EtOH Use Disorder, Bipolar Disorder, SAD, and ADHD, and 1 Suicide Attempt (cut self with knife, teenager), past Psychiatric Hospitalizations (last- [facility] 2018), and has been to [facility] 06/2022), and no history of Self Injurious Behavior.    Jah has responded well to the increase in Paxil  and light therapy.  We will not make any other history of medications at this time.  Refills were sent in.  He return for follow-up in approximately 6 weeks.   OCD  GAD  Chronic PTSD  Bipolar Disorder by Hx : -Continue Paxil  60 mg daily for anxiety and OCD.  90 (20 mg) tablets with 1 refill. -Continue Propanolol 30 mg BID for anxiety and HTN. 180 (10 mg) tablets with 1 refill -Continue Risperdal  1 mg BID for mood stability. 60 tablets with 1  refill.   EtOH Use Disorder: -Completed stay at [facility] -Continue Naltrexone  50 mg daily.  30  tablets with 1 refill.    Collaboration of Care:   Patient/Guardian was advised Release of Information must be obtained prior to any record release in order to collaborate their care with an outside provider. Patient/Guardian was advised if they have not already done so to contact the registration department to sign all necessary forms in order for us  to release information regarding their care.   Consent: Patient/Guardian gives verbal consent for treatment and assignment of benefits for services provided during this visit. Patient/Guardian expressed understanding and agreed to proceed.    Marsa GORMAN Rosser, MD 06/24/2023, 11:23 AM

## 2023-06-24 NOTE — Telephone Encounter (Signed)
  PA was started for patients PARoxetine  (PAXIL ) 20 MG tablets which was denied.   "9G Possible Safety Risk of Duplicate Therapy. Quantity Limit 30 EA in 30 Days".... Pharmacy suggest rewriting script two 30mg  tablets or 1 40mg  tablet with 1 20mg  tablet.

## 2023-06-27 ENCOUNTER — Other Ambulatory Visit (HOSPITAL_COMMUNITY): Payer: Self-pay

## 2023-06-27 ENCOUNTER — Telehealth (HOSPITAL_COMMUNITY): Payer: Self-pay

## 2023-06-27 NOTE — Telephone Encounter (Signed)
 Pt is requesting for a fill of Risperidone  1 mg tablets to be sent in to pharmacy.    JNL, CMA

## 2023-06-28 MED ORDER — PAROXETINE HCL 30 MG PO TABS: 60.0000 mg | ORAL_TABLET | Freq: Every day | ORAL | 1 refills | Status: DC

## 2023-06-28 MED ORDER — RISPERIDONE 1 MG PO TABS
1.0000 mg | ORAL_TABLET | Freq: Two times a day (BID) | ORAL | 1 refills | Status: AC
Start: 2023-06-28 — End: ?

## 2023-06-28 NOTE — Telephone Encounter (Signed)
 Received message that patient's insurance requested a different prescription for the dosing of his Paxil .  Also received message that refill of patients Risperdal  was not received when sent 4/18 so will send again.   Sent: -Paxil  60 mg daily for anxiety and OCD. 60 (30 mg) tablets with 1 refill.  -Risperdal  1 mg BID for mood stability. 60 tablets with 1 refill.    Rainell Buoy MD Resident

## 2023-06-28 NOTE — Addendum Note (Signed)
 Addended by: Boston Catarino S on: 06/28/2023 05:48 AM   Modules accepted: Orders

## 2023-06-28 NOTE — Telephone Encounter (Signed)
 See other telephone note from 4/21.  Refill sent in.  Rainell Buoy MD Resident

## 2023-08-19 ENCOUNTER — Ambulatory Visit (INDEPENDENT_AMBULATORY_CARE_PROVIDER_SITE_OTHER): Payer: MEDICAID | Admitting: Student in an Organized Health Care Education/Training Program

## 2023-08-19 ENCOUNTER — Encounter (HOSPITAL_COMMUNITY): Payer: Self-pay | Admitting: Emergency Medicine

## 2023-08-19 ENCOUNTER — Ambulatory Visit (HOSPITAL_COMMUNITY)
Admission: EM | Admit: 2023-08-19 | Discharge: 2023-08-19 | Disposition: A | Discharge status: Other Acute Inpatient Hospital | Payer: MEDICAID | Attending: Psychiatry | Admitting: Psychiatry

## 2023-08-19 ENCOUNTER — Inpatient Hospital Stay (HOSPITAL_COMMUNITY)
Admission: AD | Admit: 2023-08-19 | Discharge: 2023-08-24 | DRG: 885 | Disposition: A | Discharge status: Home / Self Care | Payer: MEDICAID | Source: Intra-hospital | Attending: Psychiatry | Admitting: Psychiatry

## 2023-08-19 DIAGNOSIS — F39 Unspecified mood [affective] disorder: Principal | ICD-10-CM | POA: Diagnosis present

## 2023-08-19 DIAGNOSIS — Z79899 Other long term (current) drug therapy: Secondary | ICD-10-CM

## 2023-08-19 DIAGNOSIS — F6381 Intermittent explosive disorder: Secondary | ICD-10-CM | POA: Diagnosis not present

## 2023-08-19 DIAGNOSIS — F1011 Alcohol abuse, in remission: Secondary | ICD-10-CM | POA: Insufficient documentation

## 2023-08-19 DIAGNOSIS — F411 Generalized anxiety disorder: Secondary | ICD-10-CM | POA: Diagnosis present

## 2023-08-19 DIAGNOSIS — Z87891 Personal history of nicotine dependence: Secondary | ICD-10-CM | POA: Diagnosis not present

## 2023-08-19 DIAGNOSIS — F4312 Post-traumatic stress disorder, chronic: Secondary | ICD-10-CM | POA: Diagnosis present

## 2023-08-19 DIAGNOSIS — F1010 Alcohol abuse, uncomplicated: Secondary | ICD-10-CM | POA: Diagnosis present

## 2023-08-19 DIAGNOSIS — F319 Bipolar disorder, unspecified: Secondary | ICD-10-CM | POA: Insufficient documentation

## 2023-08-19 DIAGNOSIS — Z9152 Personal history of nonsuicidal self-harm: Secondary | ICD-10-CM | POA: Insufficient documentation

## 2023-08-19 DIAGNOSIS — Z560 Unemployment, unspecified: Secondary | ICD-10-CM | POA: Diagnosis not present

## 2023-08-19 DIAGNOSIS — R4689 Other symptoms and signs involving appearance and behavior: Secondary | ICD-10-CM

## 2023-08-19 DIAGNOSIS — Z9151 Personal history of suicidal behavior: Secondary | ICD-10-CM

## 2023-08-19 DIAGNOSIS — F429 Obsessive-compulsive disorder, unspecified: Secondary | ICD-10-CM | POA: Diagnosis present

## 2023-08-19 DIAGNOSIS — R45851 Suicidal ideations: Secondary | ICD-10-CM | POA: Diagnosis not present

## 2023-08-19 DIAGNOSIS — Z5986 Financial insecurity: Secondary | ICD-10-CM

## 2023-08-19 DIAGNOSIS — F1090 Alcohol use, unspecified, uncomplicated: Secondary | ICD-10-CM

## 2023-08-19 DIAGNOSIS — Z5982 Transportation insecurity: Secondary | ICD-10-CM | POA: Diagnosis not present

## 2023-08-19 DIAGNOSIS — F4310 Post-traumatic stress disorder, unspecified: Secondary | ICD-10-CM

## 2023-08-19 DIAGNOSIS — F909 Attention-deficit hyperactivity disorder, unspecified type: Secondary | ICD-10-CM | POA: Diagnosis present

## 2023-08-19 LAB — POCT URINE DRUG SCREEN - MANUAL ENTRY (I-SCREEN)
POC Amphetamine UR: NOT DETECTED
POC Buprenorphine (BUP): NOT DETECTED
POC Cocaine UR: NOT DETECTED
POC Marijuana UR: POSITIVE — AB
POC Methadone UR: NOT DETECTED
POC Methamphetamine UR: NOT DETECTED
POC Morphine: NOT DETECTED
POC Oxazepam (BZO): NOT DETECTED
POC Oxycodone UR: POSITIVE — AB
POC Secobarbital (BAR): NOT DETECTED

## 2023-08-19 LAB — CBC WITH DIFFERENTIAL/PLATELET
Abs Immature Granulocytes: 0.02 10*3/uL (ref 0.00–0.07)
Basophils Absolute: 0.1 10*3/uL (ref 0.0–0.1)
Basophils Relative: 1 %
Eosinophils Absolute: 0.2 10*3/uL (ref 0.0–0.5)
Eosinophils Relative: 2 %
HCT: 35.5 % — ABNORMAL LOW (ref 39.0–52.0)
Hemoglobin: 11.9 g/dL — ABNORMAL LOW (ref 13.0–17.0)
Immature Granulocytes: 0 %
Lymphocytes Relative: 24 %
Lymphs Abs: 2.1 10*3/uL (ref 0.7–4.0)
MCH: 30.4 pg (ref 26.0–34.0)
MCHC: 33.5 g/dL (ref 30.0–36.0)
MCV: 90.8 fL (ref 80.0–100.0)
Monocytes Absolute: 0.8 10*3/uL (ref 0.1–1.0)
Monocytes Relative: 9 %
Neutro Abs: 5.8 10*3/uL (ref 1.7–7.7)
Neutrophils Relative %: 64 %
Platelets: 283 10*3/uL (ref 150–400)
RBC: 3.91 MIL/uL — ABNORMAL LOW (ref 4.22–5.81)
RDW: 12.5 % (ref 11.5–15.5)
WBC: 8.9 10*3/uL (ref 4.0–10.5)
nRBC: 0 % (ref 0.0–0.2)

## 2023-08-19 LAB — HEMOGLOBIN A1C
Hgb A1c MFr Bld: 5.7 % — ABNORMAL HIGH (ref 4.8–5.6)
Mean Plasma Glucose: 116.89 mg/dL

## 2023-08-19 LAB — COMPREHENSIVE METABOLIC PANEL WITH GFR
ALT: 16 U/L (ref 0–44)
AST: 19 U/L (ref 15–41)
Albumin: 4 g/dL (ref 3.5–5.0)
Alkaline Phosphatase: 62 U/L (ref 38–126)
Anion gap: 10 (ref 5–15)
BUN: 19 mg/dL (ref 6–20)
CO2: 26 mmol/L (ref 22–32)
Calcium: 9.5 mg/dL (ref 8.9–10.3)
Chloride: 99 mmol/L (ref 98–111)
Creatinine, Ser: 0.94 mg/dL (ref 0.61–1.24)
GFR, Estimated: >60 mL/min (ref >60)
Glucose, Bld: 80 mg/dL (ref 70–99)
Potassium: 4.9 mmol/L (ref 3.5–5.1)
Sodium: 135 mmol/L (ref 135–145)
Total Bilirubin: 0.5 mg/dL (ref 0.0–1.2)
Total Protein: 6.8 g/dL (ref 6.5–8.1)

## 2023-08-19 LAB — LIPID PANEL
Cholesterol: 170 mg/dL (ref 0–200)
HDL: 51 mg/dL (ref >40)
LDL Cholesterol: 99 mg/dL (ref 0–99)
Total CHOL/HDL Ratio: 3.3 ratio
Triglycerides: 101 mg/dL (ref <150)
VLDL: 20 mg/dL (ref 0–40)

## 2023-08-19 LAB — ETHANOL: Alcohol, Ethyl (B): <15 mg/dL (ref <15)

## 2023-08-19 LAB — TSH: TSH: 0.969 u[IU]/mL (ref 0.350–4.500)

## 2023-08-19 MED ORDER — HALOPERIDOL LACTATE 5 MG/ML IJ SOLN: 5.0000 mg | Freq: Three times a day (TID) | INTRAMUSCULAR | Status: DC | PRN

## 2023-08-19 MED ORDER — HALOPERIDOL LACTATE 5 MG/ML IJ SOLN: 10.0000 mg | Freq: Three times a day (TID) | INTRAMUSCULAR | Status: DC | PRN

## 2023-08-19 MED ORDER — RISPERIDONE 1 MG PO TABS
1.0000 mg | ORAL_TABLET | Freq: Two times a day (BID) | ORAL | Status: DC
Start: 2023-08-20 — End: 2023-08-20
  Administered 2023-08-20: 1 mg via ORAL
  Filled 2023-08-19: qty 1

## 2023-08-19 MED ORDER — HYDROXYZINE HCL 25 MG PO TABS
25.0000 mg | ORAL_TABLET | Freq: Three times a day (TID) | ORAL | Status: DC | PRN
  Administered 2023-08-20 – 2023-08-23 (×4): 25 mg via ORAL
  Filled 2023-08-19 (×5): qty 1

## 2023-08-19 MED ORDER — PROPRANOLOL HCL 10 MG PO TABS: 30.0000 mg | ORAL_TABLET | Freq: Two times a day (BID) | ORAL | 1 refills | Status: DC

## 2023-08-19 MED ORDER — ALUM & MAG HYDROXIDE-SIMETH 200-200-20 MG/5ML PO SUSP: 30.0000 mL | ORAL | Status: DC | PRN

## 2023-08-19 MED ORDER — PROPRANOLOL HCL 10 MG PO TABS
30.0000 mg | ORAL_TABLET | Freq: Two times a day (BID) | ORAL | Status: DC
  Administered 2023-08-19: 30 mg via ORAL
  Filled 2023-08-19: qty 3

## 2023-08-19 MED ORDER — RISPERIDONE 1 MG PO TABS
1.0000 mg | ORAL_TABLET | Freq: Two times a day (BID) | ORAL | Status: DC
  Administered 2023-08-19: 1 mg via ORAL
  Filled 2023-08-19: qty 1

## 2023-08-19 MED ORDER — ACETAMINOPHEN 325 MG PO TABS: 650.0000 mg | ORAL_TABLET | Freq: Four times a day (QID) | ORAL | Status: DC | PRN

## 2023-08-19 MED ORDER — DIPHENHYDRAMINE HCL 50 MG/ML IJ SOLN: 50.0000 mg | Freq: Three times a day (TID) | INTRAMUSCULAR | Status: DC | PRN

## 2023-08-19 MED ORDER — LORAZEPAM 2 MG/ML IJ SOLN: 2.0000 mg | Freq: Three times a day (TID) | INTRAMUSCULAR | Status: DC | PRN

## 2023-08-19 MED ORDER — PAROXETINE HCL 20 MG PO TABS
60.0000 mg | ORAL_TABLET | Freq: Every day | ORAL | Status: DC
  Administered 2023-08-20 – 2023-08-24 (×4): 60 mg via ORAL
  Filled 2023-08-19 (×5): qty 3

## 2023-08-19 MED ORDER — TRAZODONE HCL 50 MG PO TABS: 50.0000 mg | ORAL_TABLET | Freq: Every evening | ORAL | Status: DC | PRN

## 2023-08-19 MED ORDER — NALTREXONE HCL 50 MG PO TABS: 50.0000 mg | ORAL_TABLET | Freq: Every day | ORAL | 1 refills | Status: DC

## 2023-08-19 MED ORDER — HALOPERIDOL 5 MG PO TABS
5.0000 mg | ORAL_TABLET | Freq: Three times a day (TID) | ORAL | Status: DC | PRN
  Administered 2023-08-19: 5 mg via ORAL
  Filled 2023-08-19: qty 1

## 2023-08-19 MED ORDER — PAROXETINE HCL 20 MG PO TABS: 60.0000 mg | ORAL_TABLET | Freq: Every day | ORAL | Status: DC

## 2023-08-19 MED ORDER — MAGNESIUM HYDROXIDE 400 MG/5ML PO SUSP: 30.0000 mL | Freq: Every day | ORAL | Status: DC | PRN

## 2023-08-19 MED ORDER — HALOPERIDOL 5 MG PO TABS: 5.0000 mg | ORAL_TABLET | Freq: Three times a day (TID) | ORAL | Status: DC | PRN

## 2023-08-19 MED ORDER — DIPHENHYDRAMINE HCL 50 MG PO CAPS
50.0000 mg | ORAL_CAPSULE | Freq: Three times a day (TID) | ORAL | Status: DC | PRN
  Administered 2023-08-19: 50 mg via ORAL
  Filled 2023-08-19: qty 1

## 2023-08-19 MED ORDER — PROPRANOLOL HCL 10 MG PO TABS
30.0000 mg | ORAL_TABLET | Freq: Two times a day (BID) | ORAL | Status: DC
  Administered 2023-08-20 – 2023-08-24 (×8): 30 mg via ORAL
  Filled 2023-08-19 (×10): qty 3

## 2023-08-19 MED ORDER — NALTREXONE HCL 50 MG PO TABS
50.0000 mg | ORAL_TABLET | Freq: Every day | ORAL | Status: DC
  Administered 2023-08-20 – 2023-08-24 (×5): 50 mg via ORAL
  Filled 2023-08-19 (×5): qty 1

## 2023-08-19 MED ORDER — RISPERIDONE 1 MG PO TABS: 1.0000 mg | ORAL_TABLET | Freq: Two times a day (BID) | ORAL | 1 refills | Status: DC

## 2023-08-19 MED ORDER — DIPHENHYDRAMINE HCL 25 MG PO CAPS: 50.0000 mg | ORAL_CAPSULE | Freq: Three times a day (TID) | ORAL | Status: DC | PRN

## 2023-08-19 MED ORDER — NALTREXONE HCL 50 MG PO TABS: 50.0000 mg | ORAL_TABLET | Freq: Every day | ORAL | Status: DC

## 2023-08-19 MED ORDER — PAROXETINE HCL 30 MG PO TABS: 60.0000 mg | ORAL_TABLET | Freq: Every day | ORAL | 1 refills | Status: DC

## 2023-08-19 MED ORDER — HYDROXYZINE HCL 25 MG PO TABS
25.0000 mg | ORAL_TABLET | Freq: Three times a day (TID) | ORAL | Status: DC | PRN
  Administered 2023-08-19: 25 mg via ORAL
  Filled 2023-08-19: qty 1

## 2023-08-19 MED ORDER — TRAZODONE HCL 50 MG PO TABS
50.0000 mg | ORAL_TABLET | Freq: Every evening | ORAL | Status: DC | PRN
  Administered 2023-08-20 – 2023-08-21 (×2): 50 mg via ORAL
  Filled 2023-08-19 (×3): qty 1

## 2023-08-19 NOTE — Progress Notes (Signed)
 BH MD/PA/NP OP Progress Note  08/19/2023 1:53 PM IVOR KISHI  MRN:  [id number]  Chief Complaint:  Chief Complaint  Patient presents with   Follow-up   Depression   Anxiety   HPI:  Auren Valdes is a 43 yr old male who presents for Follow Up and Medication Management.  PPHx is significant for GAD, OCD, Insomnia, Chronic PTSD, EtOH Use Disorder, Bipolar Disorder, SAD, and ADHD, and 1 Suicide Attempt (cut self with knife, teenager), past Psychiatric Hospitalizations (last- [facility] 2018), and has been to [facility] 06/2022), and no history of Self Injurious Behavior.    He reports he has been doing worse and worse.  He reports that he is frustrated with everything and everyone.  He reports that he is tired of not having a job and tired of being broke.  He reports that his parents have been constantly complaining at him about him not having a job.  He reports part of the issue is he is unable to get a ride anywhere so he always has to walk and so because he shows up drenched in sweat everywhere and this summer he no place will hire him which further frustrates him.  He reports that he is over everything and everyone.  That the juice is not worth the squeeze.  When asked if he has thought of harming himself he confirms this and then mines putting a gun in his mouth and pulling the trigger.  Discussed with him that there were grave concerns for his safety.  Asked if he would be willing to go downstairs to be evaluated in the [facility].  After discussion he was agreeable to this.  At this point provider escorted him downstairs to be checked and to be seen.   Visit Diagnosis:    ICD-10-CM   1. GAD (generalized anxiety disorder)  F41.1 PARoxetine  (PAXIL ) 30 MG tablet    propranolol  (INDERAL ) 10 MG tablet    risperiDONE  (RISPERDAL ) 1 MG tablet    2. PTSD (post-traumatic stress disorder)  F43.10 PARoxetine  (PAXIL ) 30 MG tablet    propranolol  (INDERAL ) 10 MG tablet    risperiDONE  (RISPERDAL ) 1 MG  tablet    3. Alcohol use disorder  F10.90 naltrexone  (DEPADE) 50 MG tablet    4. Aggression  R46.89 risperiDONE  (RISPERDAL ) 1 MG tablet             Past Psychiatric History: GAD, OCD, Insomnia, Chronic PTSD, EtOH Use Disorder, Bipolar Disorder, SAD, and ADHD, 1 Suicide Attempt (cut self with knife, teenager), past Psychiatric Hospitalizations (latest [facility] 2018).   Past Medical History:  Past Medical History:  Diagnosis Date   Alcoholism (HCC)    Anemia    Anxiety    Depression     Past Surgical History:  Procedure Laterality Date   arm tendon surgery      Family Psychiatric History: Father- Cocaine Use No Known Diagnosis' or Suicides  Family History:  Family History  Problem Relation Age of Onset   Colon cancer Neg Hx    Stomach cancer Neg Hx    Esophageal cancer Neg Hx     Social History:  Social History   Socioeconomic History   Marital status: Single    Spouse name: Not on file   Number of children: Not on file   Years of education: Not on file   Highest education level: Not on file  Occupational History   Not on file  Tobacco Use   Smoking status: Former  Current packs/day: 0.50    Types: Cigarettes   Smokeless tobacco: Never  Vaping Use   Vaping status: Never Used  Substance and Sexual Activity   Alcohol use: Not Currently    Alcohol/week: 21.0 standard drinks of alcohol    Types: 21 Cans of beer per week   Drug use: No   Sexual activity: Not Currently  Other Topics Concern   Not on file  Social History Narrative   Not on file   Social Drivers of Health   Financial Resource Strain: High Risk (04/01/2021)   Overall Financial Resource Strain (CARDIA)    Difficulty of Paying Living Expenses: Hard  Food Insecurity: Unknown (08/19/2023)   Hunger Vital Sign    Worried About Running Out of Food in the Last Year: Never true    Ran Out of Food in the Last Year: Not on file  Transportation Needs: No Transportation Needs (04/01/2021)   PRAPARE -  Administrator, Civil Service (Medical): No    Lack of Transportation (Non-Medical): No  Physical Activity: Sufficiently Active (04/01/2021)   Exercise Vital Sign    Days of Exercise per Week: 7 days    Minutes of Exercise per Session: 150+ min  Stress: Stress Concern Present (04/01/2021)   [employer] of Occupational Health - Occupational Stress Questionnaire    Feeling of Stress : Very much  Social Connections: Socially Isolated (04/01/2021)   Social Connection and Isolation Panel    Frequency of Communication with Friends and Family: More than three times a week    Frequency of Social Gatherings with Friends and Family: Twice a week    Attends Religious Services: Never    Database administrator or Organizations: No    Attends Engineer, structural: Never    Marital Status: Never married    Allergies: No Known Allergies  Metabolic Disorder Labs: Lab Results  Component Value Date   HGBA1C 6.2 12/09/2022   MPG 120 05/10/2022   No results found for: PROLACTIN Lab Results  Component Value Date   CHOL 248 (H) 05/10/2022   TRIG 69 05/10/2022   HDL 110 05/10/2022   CHOLHDL 2.3 05/10/2022   VLDL 14 05/10/2022   LDLCALC 124 (H) 05/10/2022   LDLCALC 80 12/29/2021   Lab Results  Component Value Date   TSH 0.68 12/09/2022   TSH 0.472 05/10/2022    Therapeutic Level Labs: No results found for: LITHIUM No results found for: VALPROATE No results found for: CBMZ  Current Medications: No current facility-administered medications for this visit.   Current Outpatient Medications  Medication Sig Dispense Refill   lisinopril  (ZESTRIL ) 20 MG tablet Take 1 tablet (20 mg total) by mouth daily. 90 tablet 1   Multiple Vitamins-Minerals (MULTIVITAMIN GUMMIES ADULTS PO) Take 2 tablets by mouth daily.     naltrexone  (DEPADE) 50 MG tablet Take 1 tablet (50 mg total) by mouth daily. 30 tablet 1   PARoxetine  (PAXIL ) 30 MG tablet Take 2 tablets (60 mg total)  by mouth daily. 60 tablet 1   propranolol  (INDERAL ) 10 MG tablet Take 3 tablets (30 mg total) by mouth 2 (two) times daily. 180 tablet 1   risperiDONE  (RISPERDAL ) 1 MG tablet Take 1 tablet (1 mg total) by mouth 2 (two) times daily. 60 tablet 1   Facility-Administered Medications Ordered in Other Visits  Medication Dose Route Frequency Provider Last Rate Last Admin   acetaminophen  (TYLENOL ) tablet 650 mg  650 mg Oral Q6H PRN White, Patrice L,  NP       alum & mag hydroxide-simeth (MAALOX/MYLANTA) 200-200-20 MG/5ML suspension 30 mL  30 mL Oral Q4H PRN White, Patrice L, NP       haloperidol  (HALDOL ) tablet 5 mg  5 mg Oral TID PRN White, Patrice L, NP       And   diphenhydrAMINE (BENADRYL) capsule 50 mg  50 mg Oral TID PRN White, Patrice L, NP       haloperidol  lactate (HALDOL ) injection 5 mg  5 mg Intramuscular TID PRN White, Patrice L, NP       And   diphenhydrAMINE (BENADRYL) injection 50 mg  50 mg Intramuscular TID PRN White, Patrice L, NP       And   LORazepam  (ATIVAN ) injection 2 mg  2 mg Intramuscular TID PRN White, Patrice L, NP       haloperidol  lactate (HALDOL ) injection 10 mg  10 mg Intramuscular TID PRN White, Patrice L, NP       And   diphenhydrAMINE (BENADRYL) injection 50 mg  50 mg Intramuscular TID PRN White, Patrice L, NP       And   LORazepam  (ATIVAN ) injection 2 mg  2 mg Intramuscular TID PRN White, Patrice L, NP       hydrOXYzine  (ATARAX ) tablet 25 mg  25 mg Oral TID PRN White, Patrice L, NP       magnesium  hydroxide (MILK OF MAGNESIA) suspension 30 mL  30 mL Oral Daily PRN White, Patrice L, NP       [START ON 08/20/2023] naltrexone  (DEPADE) tablet 50 mg  50 mg Oral Daily White, Patrice L, NP       [START ON 08/20/2023] PARoxetine  (PAXIL ) tablet 60 mg  60 mg Oral Daily White, Patrice L, NP       propranolol  (INDERAL ) tablet 30 mg  30 mg Oral BID White, Patrice L, NP       risperiDONE  (RISPERDAL ) tablet 1 mg  1 mg Oral BID White, Patrice L, NP       traZODone  (DESYREL ) tablet  50 mg  50 mg Oral QHS PRN White, Patrice L, NP         Musculoskeletal: Strength & Muscle Tone: within normal limits Gait & Station: normal Patient leans: N/A  Psychiatric Specialty Exam: Review of Systems  Unable to perform ROS: Acuity of condition    Blood pressure 107/74, pulse 88, height 5' 7 (1.702 m), weight 158 lb 3.2 oz (71.8 kg), SpO2 97%.Body mass index is 24.78 kg/m.  General Appearance: Casual and Fairly Groomed  Eye Contact:  Poor  Speech:  Clear and Coherent and Normal Rate  Volume:  Normal  Mood:  Anxious, Depressed, and Hopeless  Affect:  Blunt, Constricted, and Depressed  Thought Process:  Coherent and Goal Directed  Orientation:  Full (Time, Place, and Person)  Thought Content: WDL and Logical   Suicidal Thoughts:  Yes.  with intent/plan  Homicidal Thoughts:  did not assess  Memory:  Immediate;   Good Recent;   Good  Judgement:  Fair  Insight:  Fair  Psychomotor Activity:  Normal  Concentration:  Concentration: Good and Attention Span: Good  Recall:  Good  Fund of Knowledge: Good  Language: Good  Akathisia:  Negative  Handed:  Right  AIMS (if indicated): not done  Assets:  Physical Health Resilience  ADL's:  Intact  Cognition: WNL  Sleep:  not assessed   Screenings: AIMS    Flowsheet Row [facility]  AIMS Total Score 0   AUDIT    Flowsheet Row Counselor from 04/01/2021 in [facility] Admission (Discharged[facility]  Alcohol Use Disorder Identification Test Final Score (AUDIT) 13 9   GAD-7    Flowsheet Row Counselor from 11/10/2021 in [facility] Counselor from 09/23/2021 in [facility] Counselor from 09/01/2021 in [facility] Counselor from 08/04/2021 in [facility] Counselor  from 07/07/2021 in [facility]  Total GAD-7 Score 8 5 9 6 18    PHQ2-9    Flowsheet Row Office Visit from 12/16/2022 in [facility] from 05/10/2022 in [facility] Counselor from 11/10/2021 in [facility] Counselor from 09/23/2021 in [facility] Counselor from 09/01/2021 in [facility]  PHQ-2 Total Score 0 1 0 0 0  PHQ-9 Total Score -- 5 3 5 3    Flowsheet Row ED from 08/19/2023 in [facility] ED from 05/10/2022 in [facility] Counselor from 09/23/2021 in [facility]  C-SSRS RISK CATEGORY High Risk No Risk No Risk     Assessment and Plan:  Alto Gandolfo is a 43 yr old male who presents for Follow Up and Medication Management.  PPHx is significant for GAD, OCD, Insomnia, Chronic PTSD, EtOH Use Disorder, Bipolar Disorder, SAD, and ADHD, and 1 Suicide Attempt (cut self with knife, teenager), past Psychiatric Hospitalizations (last- [facility] 2018), and has been to [facility] 06/2022), and no history of Self Injurious Behavior.    Deland presented very distraught and hopeless today.  He reports he has been having SI and mimed shooting himself.  Due to this the interview was stopped and discussed with him about being seen in the [facility] for further help.  After discussion he agreed to go be seen and he was escorted downstairs to the [facility] to be checked in to be seen.  Refills to his medications had already been sent in.  He will be scheduled to follow up when appropriate after the resolution of this episode.   OCD  GAD  Chronic PTSD  Bipolar Disorder by Hx : -Continue Paxil  60 mg daily for anxiety and OCD.  90 (20 mg) tablets with 1 refill. -Continue Propanolol 30 mg BID for anxiety and HTN. 180 (10 mg) tablets with 1 refill -Continue  Risperdal  1 mg BID for mood stability. 60 tablets with 1 refill.   EtOH Use Disorder: -Completed stay at [facility] -Continue Naltrexone  50 mg daily.  30 tablets with 1 refill.    Collaboration of Care:   Patient/Guardian was advised Release of Information must be obtained prior to any record release in order to collaborate their care with an outside provider. Patient/Guardian was advised if they have not already done so to contact the registration department to sign all necessary forms in order for us  to release information regarding their care.   Consent: Patient/Guardian gives verbal consent for treatment and assignment of benefits for services provided during this visit. Patient/Guardian expressed understanding and agreed to proceed.    Basilia Bosworth, DO 08/19/2023, 1:53 PM

## 2023-08-19 NOTE — ED Notes (Signed)
 Patient admitted to adult obs for SI with plan to shoot himself in the head. During assessment, pt endorsed SI with no plan. Patient contracts for safety.  He denies HI or AVH. Skin check conducted by this nurse and Doylene Genet, MHT. Patient oriented to the unit and offered food and beverage but declined. We will continue to monitor for safety.

## 2023-08-19 NOTE — Group Note (Signed)
 Date:  08/19/2023 Time:  10:20 PM  Group Topic/Focus:  Goals Group:   The focus of this group is to help patients establish daily goals to achieve during treatment and discuss how the patient can incorporate goal setting into their daily lives to aide in recovery. Wrap-Up Group:   The focus of this group is to help patients review their daily goal of treatment and discuss progress on daily workbooks.    Participation Level:  Did Not Attend  Additional Comments:    Joann Mu 08/19/2023, 10:20 PM

## 2023-08-19 NOTE — ED Notes (Signed)
Safe Transport requested to [facility]. 

## 2023-08-19 NOTE — ED Provider Notes (Signed)
 Behavioral Health Urgent Care Medical Screening Exam  Patient Name: [name] MRN: [id number] Date of Evaluation: 08/19/23 Chief Complaint:   Diagnosis:  Final diagnoses:  Mood disorder (HCC)  Suicidal ideation    History of Present illness: [name] is a 43 y.o. male patient with a documented psychiatric history significant for intermittent explosive disorder, substance abuse, GAD, bipolar, and alcohol use disorder who was sent downstairs for crisis evaluation with complaints of suicidal thoughts to shoot himself in the head with a gun during his follow-up appointment with Dr.Pashayan here at the [facility].   Patient endorses suicidal thoughts to shoot himself in the head. He denies access to firearms. He reports feeling suicidal for the past month. He states that he's been feeling frustrated with doing the same thing everyday and getting the same results. He states that he's been filing out application and looking for a job for a solid two months. He states that he is unable to recall the last time he worked and doesn't remember a lot of things. He reports two past suicide attempts, and states that he's tried taking a bunch of pills and held a gun to his head but did not fire the gun. He is unable to recall when those attempts where and states that it's hard for him to remember. He reports a history of self injurious behaviors by cutting himself when he was a teenager.   He reports feeling very depressed for the past three months or longer. He endorses depressive symptoms of isolating, and sadness, hopelessness, worthlessness, irritability, suicidal thoughts, anhedonia, decreased concentration and difficulty focusing.  He reports good sleep, on average he sleeps about 7 to 8 hours per night. He reports a good appetite. He denies auditory or visual hallucinations. He denies paranoia.   He denies using other illicit drugs. He denies drinking alcohol.  He reports a history of cocaine and methamphetamine abuse 2 years ago. He states that he has been sober from alcohol since May 10, 2022.  He is established with outpatient psychiatry here at the [facility]. Per chart review, he is prescribed naltrexone  50 mg daily, Paxil  60 mg daily, propranolol  30 mg twice daily and Risperdal  1 mg twice daily. He is [name] He reports 2 or 3 inpatient psychiatric hospitalizations. Per chart review, patient was hospitalized at [facility] 01/26/2017 and 07/29/2008.  He states that he resides alone. He does not have children. He denies legal issues. He reports a family psychiatric history of father diagnosed with depression and used cocaine and alcohol. He denies a medical history. He denies taking medical medications.    Flowsheet Row ED from 05/10/2022 in [facility] Counselor from 09/23/2021 in [facility] Counselor from 04/01/2021 in [facility]  C-SSRS RISK CATEGORY No Risk No Risk No Risk    Psychiatric Specialty Exam  Presentation  General Appearance:Appropriate for Environment  Eye Contact:Fair  Speech:Clear and Coherent  Speech Volume:Normal  Handedness:Right   Mood and Affect  Mood: Depressed  Affect: Congruent   Thought Process  Thought Processes: Coherent  Descriptions of Associations:Intact  Orientation:Full (Time, Place and Person)  Thought Content:Logical  Diagnosis of Schizophrenia or Schizoaffective disorder in past: No data recorded  Hallucinations:None  Ideas of Reference:None  Suicidal Thoughts:Yes, Active With Plan  Homicidal Thoughts:No   Sensorium  Memory: Immediate Fair;  Recent Fair; Remote Fair  Judgment: Intact  Insight: Present   Executive Functions   Concentration: Poor  Attention Span: Fair  Recall: [employer] of Knowledge: Fair  Language: Fair   Psychomotor Activity  Psychomotor Activity: Normal   Assets  Assets: Manufacturing systems engineer; Desire for Improvement; Housing; Leisure Time; Physical Health   Sleep  Sleep: Fair   Physical Exam: Physical Exam  Cardiovascular:     Rate and Rhythm: Normal rate.  Pulmonary:     Effort: Pulmonary effort is normal.   Musculoskeletal:        General: Normal range of motion.     Cervical back: Normal range of motion.   Neurological:     Mental Status: He is alert and oriented to person, place, and time.    Review of Systems  Constitutional: Negative.   HENT: Negative.    Respiratory: Negative.    Cardiovascular: Negative.   Genitourinary: Negative.   Musculoskeletal: Negative.   Neurological: Negative.   Endo/Heme/Allergies: Negative.   Psychiatric/Behavioral:  Positive for depression and suicidal ideas.    Blood pressure 100/68, pulse 77, temperature 97.9 F (36.6 C), temperature source Oral, resp. rate 18, SpO2 99%. There is no height or weight on file to calculate BMI.  Musculoskeletal: Strength & Muscle Tone: within normal limits Gait & Station: normal Patient leans: N/A   [facility] MSE Discharge Disposition for Follow up and Recommendations: Based on my evaluation I certify that psychiatric inpatient services furnished can reasonably be expected to improve the patient's condition which I recommend transfer to an appropriate accepting facility.  Patient accepted to [facility] today for inpatient psychiatric treatment.   Lab Orders         CBC with Differential/Platelet         Comprehensive metabolic panel         Hemoglobin A1c         Ethanol         Lipid panel         TSH         POCT Urine Drug Screen - (I-Screen)    EKG  Restart home medications:  Naltrexone  50 mg daily,  Paxil  60 mg daily,  Propranolol  30 mg twice daily Risperdal  1 mg twice  daily.   Gwenlyn Lento, NP 08/19/2023, 12:41 PM

## 2023-08-19 NOTE — ED Notes (Signed)
 Pt A&O x 4, sitting up on bed, anxious and restless.  Meds given.  Resting at present  Monitoring for safety

## 2023-08-19 NOTE — ED Notes (Signed)
 Pt very anxious and restless.  Pt requesting meds for anxiety.

## 2023-08-19 NOTE — Discharge Instructions (Signed)
Patient accepted to [facility] today.

## 2023-08-19 NOTE — ED Notes (Signed)
 Attempted to call report X 2. Was unable to reach staff. Will try again.

## 2023-08-19 NOTE — Progress Notes (Signed)
   08/19/23 1152  [facility] Triage Screening (Walk-ins at [facility] only)  How Did You Hear About Us ? Self  What Is the Reason for Your Visit/Call Today? Pt is a 43 year old male, was referred from [facility].  Pt reports SI, I have been thinking about shooting myself in the head.  Pt denies HI, AVH/or Drug/Alcohol use.  Pt reports I am tired of looking for a job, I am furstrated.  Pt reports a prior history of self harm by cutting ( 2 yrs ago).   Pt admits to prior MH diagnosis or prescribed medication for symptom mangment. Urgent.  How Long Has This Been Causing You Problems? 1 wk - 1 month  Have You Recently Had Any Thoughts About Hurting Yourself? Yes  How long ago did you have thoughts about hurting yourself? 24 hrs  Are You Planning to Commit Suicide/Harm Yourself At This time? No  Have you Recently Had Thoughts About Hurting Someone [name]? No  Are You Planning To Harm Someone At This Time? No  Physical Abuse Denies  Verbal Abuse Denies  Sexual Abuse Denies  Exploitation of patient/patient's resources Denies  Self-Neglect Denies  Possible abuse reported to: Other (Comment) (n/a)  Are you currently experiencing any auditory, visual or other hallucinations? No  Have You Used Any Alcohol or Drugs in the Past 24 Hours? No  Do you have any current medical co-morbidities that require immediate attention? No  Clinician description of patient physical appearance/behavior: Pt engaged  What Do You Feel Would Help You the Most Today? Treatment for Depression or other mood problem  If access to [facility] Urgent Care was not available, would you have sought care in the Emergency Department? Yes  Determination of Need Urgent (48 hours)  Options For Referral Inpatient Hospitalization;Facility-Based Crisis  Determination of Need filed? Yes

## 2023-08-19 NOTE — Progress Notes (Signed)
 Pt has been accepted to [facility] on 08/19/2023 . Bed assignment:407-1   Pt meets inpatient criteria per [name], NP  Attending Physician will be Dr. Zouev  Report can be called to: Adult unit: [phone]  Pt can arrive pending labs   Care Team Notified: [facility] Kathryn Parish RN, [name], NP, Keri Peat, RN

## 2023-08-19 NOTE — ED Notes (Signed)
 Report called to RN Johnice Nail, [facility].  Pending transport at 10pm.

## 2023-08-19 NOTE — BH Assessment (Signed)
 Comprehensive Clinical Assessment (CCA) Note  08/19/2023 [name] [id number]  Disposition: Per [name], NP patient is recommended for inpatient treatment  The patient demonstrates the following risk factors for suicide: Chronic risk factors for suicide include: psychiatric disorder of depression, previous suicide attempts unable to remember dates, and previous self-harm engaged in cutting behavior 2 years ago. Acute risk factors for suicide include: unemployment. Protective factors for this patient include: positive therapeutic relationship. Considering these factors, the overall suicide risk at this point appears to be high. Patient is not appropriate for outpatient follow up.  Patient is a 43 year old male who presents to [facility] voluntarily due being referred by [facility] counselor, [name] due to having SI with thoughts of shooting himself in the head, although he denies access to firearms. The patient reports two past suicide attempts, and states that he's tried taking a bunch of pills and held a gun to his head but did not fire the gun. He is unable to recall when those attempts were.   Patient reports depressive symptoms of of isolating, and sadness, hopelessness, worthlessness, irritability, suicidal thoughts, difficulty concentrating and difficulty focusing.Patient denies HI and AVH.     Although patient denied drug use, his POC urine test  was positive for oxycodone and marijuana. He reports that he has not used alcohol since March of  2024.  His POC was negative for alcohol.   Patient is established with outpatient psychiatry here at the [facility]. He reports engaging in outpatient services with [name] at [facility]. He reports having at leas 2 psychiatric inpatient hospitalizations.  MSE: patient is casually dressed, alert, and oriented x4, with normal speech and normal motor behavior. The patient's eye contact is good. Patient's mood is depressed with congruent affect. The patient's  thought process is coherent and relevant. There is no indication that the patient is currently responding to internal stimuli or experiencing delusional thought content.  Patient was cooperative throughout assessment.    Chief Complaint:  Chief Complaint  Patient presents with   Suicidal   Visit Diagnosis:  Mood disorder (HCC)                                Suicidal ideation    CCA Screening, Triage and Referral (STR)  Patient Reported Information How did you hear about us ? Self  What Is the Reason for Your Visit/Call Today? Pt is a 43 year old male, was referred from [facility].  Pt reports SI, I have been thinking about shooting myself in the head.  Pt denies HI, AVH/or Drug/Alcohol use.  Pt reports I am tired of looking for a job, I am furstrated.  Pt reports a prior history of self harm by cutting ( 2 yrs ago).   Pt admits to prior MH diagnosis or prescribed medication for symptom mangment. Urgent.  How Long Has This Been Causing You Problems? 1 wk - 1 month  What Do You Feel Would Help You the Most Today? Treatment for Depression or other mood problem   Have You Recently Had Any Thoughts About Hurting Yourself? Yes  Are You Planning to Commit Suicide/Harm Yourself At This time? No   Flowsheet Row ED from 08/19/2023 [facility] ED from 05/10/2022 in [facility] Counselor from 09/23/2021 in [facility]  C-SSRS RISK CATEGORY High Risk No Risk No Risk    Have you Recently Had  Thoughts About Hurting Someone [name]? No  Are You Planning to Harm Someone at This Time? No  Explanation: N/A  Have You Used Any Alcohol or Drugs in the Past 24 Hours? No  How Long Ago Did You Use Drugs or Alcohol? 1 year ago What Did You Use and How Much? N/A  Do You Currently Have a Therapist/Psychiatrist? Yes Name of Therapist/Psychiatrist: [name] at [facility]    Have You Been Recently  Discharged From Any Office Practice or Programs? N/A Explanation of Discharge From Practice/Program: N/A    CCA Screening Triage Referral Assessment Type of Contact: Face-to-Face  Telemedicine Service Delivery:   Is this Initial or Reassessment?   Date Telepsych consult ordered in [facility]:    Time Telepsych consult ordered in [facility]:    Location of Assessment: [facility] Assessment Services  Provider Location: [facility] Assessment Services   Collateral Involvement: None   Does Patient Have a Automotive engineer Guardian? No  Legal Guardian Contact Information: N/N  Copy of Legal Guardianship Form: -- (N/A)  Legal Guardian Notified of Arrival: -- (N/A)  Legal Guardian Notified of Pending Discharge: -- (N/A)  If Minor and Not Living with Parent(s), Who has Custody? N/A  Is CPS involved or ever been involved? Never  Is APS involved or ever been involved? Never   Patient Determined To Be At Risk for Harm To Self or Others Based on Review of Patient Reported Information or Presenting Complaint? Yes, for Self-Harm  Method: Plan without intent  Availability of Means: Has close by  Intent: Vague intent or NA  Notification Required: No need or identified person  Additional Information for Danger to Others Potential: -- (N/A)  Additional Comments for Danger to Others Potential: N/A  Are There Guns or Other Weapons in Your Home? -- (UTA)  Types of Guns/Weapons: paitent but reprots that he once put a gun to his hed with intent to kill himself.  di not identify who the gun belonged to  Are These Weapons Safely Secured?                            -- Industrial/product designer)  Who Could Verify You Are Able To Have These Secured: UTA  Do You Have any Outstanding Charges, Pending Court Dates, Parole/Probation? N/A  Contacted To Inform of Risk of Harm To Self or Others: Other: Comment (N/A)    Does Patient Present under Involuntary Commitment? No    [place] of Residence: [place]   Patient  Currently Receiving the Following Services: Medication Management; Individual Therapy   Determination of Need: Urgent (48 hours)   Options For Referral: Inpatient Hospitalization; Medication Management; Facility-Based Crisis     CCA Biopsychosocial Patient Reported Schizophrenia/Schizoaffective Diagnosis in Past: No   Strengths: willing to engage in treatment, get help with alcohol abuse   Mental Health Symptoms Depression:  Change in energy/activity; Worthlessness; Hopelessness; Irritability; Sleep (too much or little); Difficulty Concentrating   Duration of Depressive symptoms: Duration of Depressive Symptoms: Greater than two weeks   Mania:  Irritability; Racing thoughts   Anxiety:   Tension; Worrying; Sleep; Restlessness; Irritability; Fatigue; Difficulty concentrating   Psychosis:  None   Duration of Psychotic symptoms:    Trauma:  Detachment from others; Irritability/anger   Obsessions:  None   Compulsions:  None   Inattention:  None   Hyperactivity/Impulsivity:  None   Oppositional/Defiant Behaviors:  None   Emotional Irregularity:  N/A   Other Mood/Personality Symptoms:  None   Mental Status Exam Appearance and self-care  Stature:  Average   Weight:  Average weight   Clothing:  Casual   Grooming:  Normal   Cosmetic use:  None   Posture/gait:  Normal   Motor activity:  Not Remarkable   Sensorium  Attention:  Normal   Concentration:  Normal   Orientation:  X5   Recall/memory:  Normal   Affect and Mood  Affect:  Anxious   Mood:  Anxious   Relating  Eye contact:  Normal   Facial expression:  Responsive   Attitude toward examiner:  Cooperative   Thought and Language  Speech flow: Clear and Coherent   Thought content:  Appropriate to Mood and Circumstances   Preoccupation:  None   Hallucinations:  None   Organization:  Coherent   [employer] of Knowledge:  Fair   Intelligence:  Average   Abstraction:   Curator   Judgement:  Fair   Dance movement psychotherapist:  Realistic   Insight:  Good   Decision Making:  Normal   Social Functioning  Social Maturity:  Isolates   Social Judgement:  Normal   Stress  Stressors:  Surveyor, quantity; Other (Comment) (life stressors)   Coping Ability:  Human resources officer Deficits:  None  Supports:  Friends/Service system; Family     Religion: Religion/Spirituality Are You A Religious Person?: No How Might This Affect Treatment?: N/A  Leisure/Recreation: Leisure / Recreation Do You Have Hobbies?: Yes Leisure and Hobbies: Fishing  Exercise/Diet: Exercise/Diet Do You Exercise?: Yes What Type of Exercise Do You Do?: Run/Walk How Many Times a Week Do You Exercise?: 6-7 times a week Have You Gained or Lost A Significant Amount of Weight in the Past Six Months?: No Do You Follow a Special Diet?: No Do You Have Any Trouble Sleeping?: No   CCA Employment/Education Employment/Work Situation: 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 Been Impacted by Current Illness: No  Education: Education Is Patient Currently Attending School?: No Last Grade Completed: 12 Did You Have An Individualized Education Program (IIEP): Yes Did You Have Any Difficulty At School?: Yes Were Any Medications Ever Prescribed For These Difficulties?: No Patient's Education Has Been Impacted by Current Illness: No   CCA Family/Childhood History Family and Relationship History: Family history Marital status: Single Does patient have children?: No  Childhood History:  Childhood History By whom was/is the patient raised?: Both parents Did patient suffer any verbal/emotional/physical/sexual abuse as a child?: No Did patient suffer from severe childhood neglect?: No Has patient ever been sexually abused/assaulted/raped as an adolescent or adult?: No Was the patient ever a victim of a crime or a disaster?: No Witnessed domestic violence?: No Has patient been affected by  domestic violence as an adult?: No       CCA Substance Use Alcohol/Drug Use: Alcohol / Drug Use Pain Medications: See MAR Prescriptions: See MAR Over the Counter: See MAR Longest period of sobriety (when/how long): 1 year 3 months Negative Consequences of Use: Work / Programmer, multimedia Withdrawal Symptoms: Irritability Substance #1 Name of Substance 1: Cocaine 1 - Age of First Use: unknown 1 - Amount (size/oz): unknown 1 - Frequency: UTA 1 - Duration: UTA 1 - Last Use / Amount: unknown 1 - Method of Aquiring: purchase 1- Route of Use: UTA Substance #2 Name of Substance 2: methamphetamine 2 - Age of First Use: UTA 2 - Amount (size/oz): Denires 2 - Frequency: UTA 2 - Duration: UTA 2 - Last Use / Amount:  UTA 2 - Method of Aquiring: UTA 2 - Route of Substance Use: UTA Substance #3 Name of Substance 3: Marijuana 3 - Age of First Use: UTA 3 - Amount (size/oz): Unknown 3 - Frequency: daily 3 - Duration: ongoing 3 - Last Use / Amount: yesterday 3 - Method of Aquiring: purchase 3 - Route of Substance Use: smoking                   ASAM's:  Six Dimensions of Multidimensional Assessment  Dimension 1:  Acute Intoxication and/or Withdrawal Potential:   Dimension 1:  Description of individual's past and current experiences of substance use and withdrawal: history of substance use and withdrawal  Dimension 2:  Biomedical Conditions and Complications:   Dimension 2:  Description of patient's biomedical conditions and  complications: history of mental health PTSD and depression  Dimension 3:  Emotional, Behavioral, or Cognitive Conditions and Complications:  Dimension 3:  Description of emotional, behavioral, or cognitive conditions and complications: history of depression and bipolar  Dimension 4:  Readiness to Change:  Dimension 4:  Description of Readiness to Change criteria: ready to change and seeking treatment  Dimension 5:  Relapse, Continued use, or Continued Problem Potential:   Dimension 5:  Relapse, continued use, or continued problem potential critiera description: history of previous treatment options  Dimension 6:  Recovery/Living Environment:     ASAM Severity Score: ASAM's Severity Rating Score: 10  ASAM Recommended Level of Treatment: ASAM Recommended Level of Treatment: Level II Partial Hospitalization Treatment   Substance use Disorder (SUD) Substance Use Disorder (SUD)  Checklist Symptoms of Substance Use: Continued use despite having a persistent/recurrent physical/psychological problem caused/exacerbated by use, Substance(s) often taken in larger amounts or over longer times than was intended, Evidence of tolerance, Large amounts of time spent to obtain, use or recover from the substance(s), Evidence of withdrawal (Comment), Persistent desire or unsuccessful efforts to cut down or control use, Presence of craving or strong urge to use  Recommendations for Services/Supports/Treatments: Recommendations for Services/Supports/Treatments Recommendations For Services/Supports/Treatments: Inpatient Hospitalization  Disposition Recommendation per psychiatric provider: We recommend inpatient psychiatric hospitalization when medically cleared. Patient is under voluntary admission status at this time; please IVC if attempts to leave hospital.   DSM5 Diagnoses: Patient Active Problem List   Diagnosis Date Noted   Normocytic anemia 12/17/2022   Vitamin D  deficiency 12/17/2022   Prediabetes 12/09/2022   Hypertension 12/09/2022   Elevated liver enzymes 12/09/2022   Alcohol use disorder 05/10/2022   GAD (generalized anxiety disorder) 07/07/2021   Agoraphobia 07/07/2021   Bipolar 1 disorder, mixed, moderate (HCC) 05/13/2021   Substance induced mood disorder (HCC) 04/01/2021   PTSD (post-traumatic stress disorder) 04/01/2021   Alcohol abuse with alcohol-induced mood disorder (HCC) 01/26/2017   Intermittent explosive disorder 01/26/2017     Referrals to  Alternative Service(s): Referred to Alternative Service(s):   Place:   Date:   Time:    Referred to Alternative Service(s):   Place:   Date:   Time:    Referred to Alternative Service(s):   Place:   Date:   Time:    Referred to Alternative Service(s):   Place:   Date:   Time:     Verlean Glee, LCSW

## 2023-08-20 ENCOUNTER — Encounter (HOSPITAL_COMMUNITY): Payer: Self-pay | Admitting: Behavioral Health

## 2023-08-20 ENCOUNTER — Other Ambulatory Visit: Payer: Self-pay

## 2023-08-20 MED ORDER — RISPERIDONE 2 MG PO TABS
2.0000 mg | ORAL_TABLET | Freq: Every day | ORAL | Status: DC
  Administered 2023-08-20 – 2023-08-21 (×2): 2 mg via ORAL
  Filled 2023-08-20 (×2): qty 1

## 2023-08-20 MED ORDER — RISPERIDONE 1 MG PO TABS
1.0000 mg | ORAL_TABLET | Freq: Every day | ORAL | Status: DC
  Administered 2023-08-21 – 2023-08-24 (×4): 1 mg via ORAL
  Filled 2023-08-20 (×4): qty 1

## 2023-08-20 NOTE — Progress Notes (Signed)
   08/19/23 2200  Psych Admission Type (Psych Patients Only)  Admission Status Voluntary  Psychosocial Assessment  Patient Complaints Self-harm thoughts;Anxiety;Depression  Eye Contact Brief  Facial Expression Anxious  Affect Anxious  Speech Logical/coherent;Pressured  Interaction Assertive  Motor Activity Slow  Appearance/Hygiene In scrubs  Behavior Characteristics Cooperative  Mood Anxious;Depressed  Thought Process  Coherency WDL  Content WDL  Delusions None reported or observed  Perception WDL  Hallucination None reported or observed  Judgment Poor  Confusion None  Danger to Self  Current suicidal ideation? Passive  Description of Suicide Plan to shoot self with a gun  Self-Injurious Behavior Some self-injurious ideation observed or expressed.  No lethal plan expressed   Agreement Not to Harm Self Yes  Description of Agreement verbal  Danger to Others  Danger to Others None reported or observed

## 2023-08-20 NOTE — Plan of Care (Addendum)
 Pt with fair eye contact, logical, soft speech, A & O X4. Pt observed to be guarded, cautious, demanding discharge on interactions. Currently denies SI, HI, AVH and pain when assessed. Per pt I want to go home, they misunderstood me at the other place, I'm not trying to kill myself. I went over there to chit chat and go home. I thought they asked how I will do it. I have no access to gun right now. I need to leave, my dad is in [place]  and my mom is on vacation. [place]. Rates his depression 3/10, anxiety 6/10 and anger 2/10 with stressor being in here right now. Isolative to room majority of this shift. Remains medication compliant. Denies adverse drug reactions. Safety checks maintained at Q 15 minutes intervals without incident. Support, encouragement and reassurance offered.   Problem: Activity: Goal: Sleeping patterns will improve Outcome: Progressing   Problem: Safety: Goal: Periods of time without injury will increase Outcome: Progressing   Problem: Medication: Goal: Compliance with prescribed medication regimen will improve Outcome: Progressing
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
## 2023-08-20 NOTE — Plan of Care (Signed)
   Problem: Education: Goal: Emotional status will improve Outcome: Not Progressing Goal: Mental status will improve Outcome: Not Progressing
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
## 2023-08-20 NOTE — Progress Notes (Signed)
  Admission Note:  [name] is a 43 y.o. Caucasian male being admitted voluntarily to [facility] after presenting to [facility] with a complaint of suicidal thoughts to shoot himself in the head with a gun.  On assessment, the patient rates his anxiety 8/10, depression 5/10, and denies pain.  The patient denies SI/HI/AVH, and agrees to contact a member of staff prior to acting on any suicidal thoughts.  Lab results show the patient's Hgb A1C was elevated at 5.7; UDS was positive for Oxycodone and Marijuana with a BAL<15.  The patient denies verbal, physical, sexual abuse in the past or present.  Patient reports he is triggered by certain sounds and loud noises. According to report provided, patient has had 2 prior suicide attempts.  Patient notes one suicide attempt years ago involved medication.  Patient reports, he lives alone in a house.  The patient describes his main stressors as: life stating he has been looking for a job and nothing is biting and I am sick of it.  A skin search resulted in the following:  Right antecubital bruising r/t blood draw; several left arm healed cuts; Right posterior scratch to the back; multiple tattoos. Contraband was not found on his person, but was found amongst his belongings in the form of a knife.  Security was notified and item secured per belonging sheet.  Patient was oriented to the unit.  Nourishment and hydration was provided.  Administered PRN Hydroxyzine  and Trazodone  per [facility] per patient request.  Patient is safe on the unit with q15 minute safety checks.

## 2023-08-20 NOTE — Tx Team (Signed)
 Initial Treatment Plan 08/20/2023 12:25 AM [name] BJY:[id number]    PATIENT STRESSORS: Financial difficulties   Occupational concerns     PATIENT STRENGTHS: Ability for insight  Capable of independent living  Motivation for treatment/growth  Supportive family/friends    PATIENT IDENTIFIED PROBLEMS: Suicidal  Anxiety  Depression                 DISCHARGE CRITERIA:  Improved stabilization in mood, thinking, and/or behavior Reduction of life-threatening or endangering symptoms to within safe limits  PRELIMINARY DISCHARGE PLAN: Outpatient therapy  PATIENT/FAMILY INVOLVEMENT: This treatment plan has been presented to and reviewed with the patient, [name].  The patient and family have been given the opportunity to ask questions and make suggestions.  Salbador Crate, RN 08/20/2023, 12:25 AM

## 2023-08-21 DIAGNOSIS — F39 Unspecified mood [affective] disorder: Secondary | ICD-10-CM | POA: Diagnosis not present

## 2023-08-21 MED ORDER — TRAZODONE HCL 50 MG PO TABS
50.0000 mg | ORAL_TABLET | Freq: Once | ORAL | Status: AC
Start: 2023-08-21 — End: 2023-08-21
  Administered 2023-08-21: 50 mg via ORAL
  Filled 2023-08-21: qty 1

## 2023-08-21 NOTE — Plan of Care (Signed)
   Problem: Education: Goal: Emotional status will improve Outcome: Progressing Goal: Mental status will improve Outcome: Progressing

## 2023-08-21 NOTE — Group Note (Signed)
 Date:  08/21/2023 Time:  9:45 AM  Group Topic/Focus:  Goals Group:   The focus of this group is to help patients establish daily goals to achieve during treatment and discuss how the patient can incorporate goal setting into their daily lives to aide in recovery.    Participation Level:  Did Not Attend

## 2023-08-21 NOTE — Plan of Care (Signed)
 Nurse discussed anxiety, depression and coping skills with patient.

## 2023-08-21 NOTE — Plan of Care (Signed)
   Problem: Education: Goal: Emotional status will improve Outcome: Progressing Goal: Mental status will improve Outcome: Progressing   Problem: Activity: Goal: Interest or engagement in activities will improve Outcome: Progressing

## 2023-08-21 NOTE — Progress Notes (Signed)
   08/21/23 2130  Psych Admission Type (Psych Patients Only)  Admission Status Voluntary  Psychosocial Assessment  Patient Complaints Anxiety  Eye Contact Brief  Facial Expression Anxious  Affect Appropriate to circumstance  Speech Logical/coherent  Interaction Assertive  Motor Activity Slow  Appearance/Hygiene Unremarkable  Behavior Characteristics Cooperative  Mood Anxious  Aggressive Behavior  Effect No apparent injury  Thought Process  Coherency WDL  Content WDL  Delusions WDL  Perception WDL  Hallucination None reported or observed  Judgment WDL  Confusion WDL  Danger to Self  Current suicidal ideation? Denies  Danger to Others  Danger to Others None reported or observed

## 2023-08-21 NOTE — Group Note (Signed)
 LCSW Group Therapy Note  Group Date: 08/21/2023 Start Time: 1100 End Time: 1200   Type of Therapy and Topic:  Group Therapy - Healthy vs Unhealthy Coping Skills  Participation Level:  Did Not Attend   Description of Group The focus of this group was to determine what unhealthy coping techniques typically are used by group members and what healthy coping techniques would be helpful in coping with various problems. Patients were guided in becoming aware of the differences between healthy and unhealthy coping techniques. Patients were asked to identify 2-3 healthy coping skills they would like to learn to use more effectively.  Therapeutic Goals Patients learned that coping is what human beings do all day long to deal with various situations in their lives Patients defined and discussed healthy vs unhealthy coping techniques Patients identified their preferred coping techniques and identified whether these were healthy or unhealthy Patients determined 2-3 healthy coping skills they would like to become more familiar with and use more often. Patients provided support and ideas to each other   Summary of Patient Progress:  Patient did not attend group.    Therapeutic Modalities Cognitive Behavioral Therapy Motivational Interviewing  Valeen Gartner 08/21/2023  11:54 AM

## 2023-08-21 NOTE — Progress Notes (Signed)
 During this shift, administered PRN Hydroxyzine  for anxiety and Trazodone  for sleep per [facility] per patient request.

## 2023-08-21 NOTE — Progress Notes (Signed)
 Pt stated he had a hard time going to sleep last night, pt had to get another 50 mg Trazodone  ordered by NP on call. Pt encouraged to ask the doctor in the morning to increase the Trazodone  or at least order a repeat

## 2023-08-21 NOTE — Progress Notes (Signed)
 [facility] MD Progress Note  08/21/2023 3:19 PM VIC ESCO  MRN:  [id number] Subjective:   Patient seen at bedside and reports fair sleep and appetite. Denies SI, HI or AVH. Admits to being under a lot of stress recently. Patient is tolerating increase of Risperdal  for augmentation and denies side effects. Patient states he was sarcastic and was not going to kill himself.  Principal Problem: Mood disorder (HCC) Diagnosis: Principal Problem:   Mood disorder (HCC)  Total Time spent with patient: 30 minutes  Past Psychiatric History: see H&P  Past Medical History:  Past Medical History:  Diagnosis Date   Alcoholism (HCC)    Anemia    Anxiety    Depression     Past Surgical History:  Procedure Laterality Date   arm tendon surgery     Family History:  Family History  Problem Relation Age of Onset   Colon cancer Neg Hx    Stomach cancer Neg Hx    Esophageal cancer Neg Hx    Family Psychiatric  History: see H&P Social History:  Social History   Substance and Sexual Activity  Alcohol Use Not Currently     Social History   Substance and Sexual Activity  Drug Use Yes   Types: Marijuana, Oxycodone    Social History   Socioeconomic History   Marital status: Single    Spouse name: Not on file   Number of children: Not on file   Years of education: Not on file   Highest education level: Not on file  Occupational History   Not on file  Tobacco Use   Smoking status: Former    Types: Cigarettes   Smokeless tobacco: Never  Vaping Use   Vaping status: Never Used  Substance and Sexual Activity   Alcohol use: Not Currently   Drug use: Yes    Types: Marijuana, Oxycodone   Sexual activity: Not Currently  Other Topics Concern   Not on file  Social History Narrative   Not on file   Social Drivers of Health   Financial Resource Strain: High Risk (04/01/2021)   Overall Financial Resource Strain (CARDIA)    Difficulty of Paying Living Expenses: Hard  Food Insecurity: No  Food Insecurity (08/19/2023)   Hunger Vital Sign    Worried About Running Out of Food in the Last Year: Never true    Ran Out of Food in the Last Year: Never true  Transportation Needs: Patient Declined (08/19/2023)   PRAPARE - Transportation    Lack of Transportation (Medical): Patient declined    Lack of Transportation (Non-Medical): Patient declined  Physical Activity: Sufficiently Active (04/01/2021)   Exercise Vital Sign    Days of Exercise per Week: 7 days    Minutes of Exercise per Session: 150+ min  Stress: Stress Concern Present (04/01/2021)   [employer] of Occupational Health - Occupational Stress Questionnaire    Feeling of Stress : Very much  Social Connections: Socially Isolated (04/01/2021)   Social Connection and Isolation Panel    Frequency of Communication with Friends and Family: More than three times a week    Frequency of Social Gatherings with Friends and Family: Twice a week    Attends Religious Services: Never    Database administrator or Organizations: No    Attends Banker Meetings: Never    Marital Status: Never married   Additional Social History:  Sleep: Fair Estimated Sleeping Duration (Last 24 Hours): 8.25-9.00 hours  Appetite:  Fair  Current Medications: Current Facility-Administered Medications  Medication Dose Route Frequency Provider Last Rate Last Admin   acetaminophen  (TYLENOL ) tablet 650 mg  650 mg Oral Q6H PRN White, Patrice L, NP       alum & mag hydroxide-simeth (MAALOX/MYLANTA) 200-200-20 MG/5ML suspension 30 mL  30 mL Oral Q4H PRN White, Patrice L, NP       haloperidol  (HALDOL ) tablet 5 mg  5 mg Oral TID PRN White, Patrice L, NP       And   diphenhydrAMINE (BENADRYL) capsule 50 mg  50 mg Oral TID PRN White, Patrice L, NP       haloperidol  lactate (HALDOL ) injection 5 mg  5 mg Intramuscular TID PRN White, Patrice L, NP       And   diphenhydrAMINE (BENADRYL) injection 50 mg  50 mg  Intramuscular TID PRN White, Patrice L, NP       And   LORazepam  (ATIVAN ) injection 2 mg  2 mg Intramuscular TID PRN White, Patrice L, NP       haloperidol  lactate (HALDOL ) injection 10 mg  10 mg Intramuscular TID PRN White, Patrice L, NP       And   diphenhydrAMINE (BENADRYL) injection 50 mg  50 mg Intramuscular TID PRN White, Patrice L, NP       And   LORazepam  (ATIVAN ) injection 2 mg  2 mg Intramuscular TID PRN White, Patrice L, NP       hydrOXYzine  (ATARAX ) tablet 25 mg  25 mg Oral TID PRN White, Patrice L, NP   25 mg at 08/20/23 2107   magnesium  hydroxide (MILK OF MAGNESIA) suspension 30 mL  30 mL Oral Daily PRN White, Patrice L, NP       naltrexone  (DEPADE) tablet 50 mg  50 mg Oral Daily White, Patrice L, NP   50 mg at 08/21/23 2952   PARoxetine  (PAXIL ) tablet 60 mg  60 mg Oral Daily White, Patrice L, NP   60 mg at 08/21/23 0806   propranolol  (INDERAL ) tablet 30 mg  30 mg Oral BID White, Patrice L, NP   30 mg at 08/21/23 0806   risperiDONE  (RISPERDAL ) tablet 1 mg  1 mg Oral Q1200 Chien, Stephanie, MD   1 mg at 08/21/23 1207   risperiDONE  (RISPERDAL ) tablet 2 mg  2 mg Oral QHS Chien, Stephanie, MD   2 mg at 08/20/23 2106   traZODone  (DESYREL ) tablet 50 mg  50 mg Oral QHS PRN White, Patrice L, NP   50 mg at 08/20/23 2106    Lab Results: No results found for this or any previous visit (from the past 48 hours).  Blood Alcohol level:  Lab Results  Component Value Date   ETH <15 08/19/2023   ETH 281 (H) 01/25/2017    Metabolic Disorder Labs: Lab Results  Component Value Date   HGBA1C 5.7 (H) 08/19/2023   MPG 116.89 08/19/2023   MPG 120 05/10/2022   No results found for: PROLACTIN Lab Results  Component Value Date   CHOL 170 08/19/2023   TRIG 101 08/19/2023   HDL 51 08/19/2023   CHOLHDL 3.3 08/19/2023   VLDL 20 08/19/2023   LDLCALC 99 08/19/2023   LDLCALC 124 (H) 05/10/2022    Physical Findings: AIMS:  ,  ,  ,  ,  ,  ,   CIWA:    COWS:      Musculoskeletal: Strength & Muscle  Tone: within normal limits Gait & Station: normal Patient leans: N/A  Psychiatric Specialty Exam:  Presentation  General Appearance:  Appropriate for Environment  Eye Contact: Fair  Speech: Clear and Coherent  Speech Volume: Normal  Handedness: Right   Mood and Affect  Mood: Depressed  Affect: Congruent   Thought Process  Thought Processes: Coherent  Descriptions of Associations:Intact  Orientation:Full (Time, Place and Person)  Thought Content:Logical  History of Schizophrenia/Schizoaffective disorder:No  Duration of Psychotic Symptoms:No data recorded Hallucinations:denies Ideas of Reference: denies  Suicidal Thoughts: denies today Homicidal Thoughts:denies  Sensorium  Memory: Immediate Fair; Recent Fair; Remote Fair  Judgment: Intact  Insight: Present   Executive Functions  Concentration: Poor  Attention Span: Fair  Recall: [employer] of Knowledge: Fair  Language: Fair   Psychomotor Activity  Psychomotor Activity:No data recorded  Assets  Assets: Communication Skills; Desire for Improvement; Housing; Leisure Time; Physical Health   Sleep  Sleep:No data recorded   Physical Exam: Constitutional:      Appearance: the patient is not toxic-appearing.  Pulmonary:     Effort: Pulmonary effort is normal.  Neurological:     General: No focal deficit present.     Mental Status: the patient is alert and oriented to person, place, and time.    Review of Systems  Respiratory:  Negative for shortness of breath.   Cardiovascular:  Negative for chest pain.  Gastrointestinal:  Negative for abdominal pain, constipation, diarrhea, nausea and vomiting.  Neurological:  Negative for headaches.  Blood pressure 114/86, pulse 73, temperature 97.7 F (36.5 C), temperature source Oral, resp. rate 18, height 5' 6 (1.676 m), weight 70.5 kg, SpO2 100%. Body mass index is 25.08 kg/m.   Treatment  Plan Summary: 43 y.o. male  with a past psychiatric history of GAD, OCD, Insomnia, Chronic PTSD, EtOH Use Disorder, Bipolar Disorder, SAD, and ADHD, and 1 Suicide Attempt (cut self with knife, teenager), 3 past Psychiatric Hospitalizations (last- [facility] 2018), and has been to [facility]), and no history of Self Injurious Behavior. Patient initially arrived to [facility] on 6/13 for suicidal ideation with plan, and admitted to [facility] Voluntary on 6/14 for acute safety concerns.   6/15: patient presented with worsening depression to outpatient provider. When asked if he has thought of harming himself he confirms this and then mines putting a gun in his mouth and pulling the trigger.   Patient is now minimizing all symptoms. Denies SI or HI. Denies wanting to be dead and claims he was sarcastic. Affect incongruent. Encouraged patient to attend group therapy while he's here. Patient has a history of suicide attempts.   Major depressive disorder (r/o intermittent explosive disorder) OCD GAD Chronic PTSD Bipolar Disorder by Hx Alcohol use disorder    PLAN: Safety and Monitoring:            --  VOLUNTARY  admission to inpatient psychiatric unit for safety, stabilization and treatment            -- Daily contact with patient to assess and evaluate symptoms and progress in treatment            -- Patient's case to be discussed in multi-disciplinary team meeting            -- Observation Level : q15 minute checks            -- Vital signs:  q12 hours            -- Precautions: suicide, elopement, and  assault   2. Psychiatric Diagnoses and Treatment:             --continue home naltrexone  50mg  for alcohol use disorder            --increased home risperdal  to 1mg , 2mg  at bedtime for additional mood stability            --continue home paxil  60mg  daily for anxiety and depression            --continue home propranolol  30mg  BID for anxiety    -- The  risks/benefits/side-effects/alternatives to this medication were discussed in detail with the patient and time was given for questions. The patient consents to medication trial.              -- Metabolic profile and EKG monitoring obtained while on an atypical antipsychotic  BMI: Body mass index is 25.08 kg/m. TSH:   HbgA1c:  Recent Labs       Lab Results  Component Value Date    HGBA1C 5.7 (H) 08/19/2023      QTc: 422             -- Encouraged patient to participate in unit milieu and in scheduled group therapies    Other PRNS:  acetaminophen , alum & mag hydroxide-simeth, haloperidol  **AND** diphenhydrAMINE, haloperidol  lactate **AND** diphenhydrAMINE **AND** LORazepam , haloperidol  lactate **AND** diphenhydrAMINE **AND** LORazepam , hydrOXYzine , magnesium  hydroxide, traZODone                  -- As needed agitation protocol in-place   3. Medical Issues Being Addressed:    --None   4. Discharge Planning:             -- Social work and case management to assist with discharge planning and identification of hospital follow-up needs prior to discharge            -- Estimated LOS: 5-7 days            -- Discharge Concerns: Need to establish a safety plan; Medication compliance and effectiveness            -- Discharge Goals: Return home with outpatient referrals for mental health follow-up including medication management/psychotherapy   Pressley Barsky, MD 08/21/2023, 3:19 PM

## 2023-08-21 NOTE — Progress Notes (Signed)
 D:  Patient denied SI and HI, contracts for safety.  Denied A/V hallucinations.  Denied pain. A:  Medications administered per MD orders.  Emotional support and encouragement given patient. R:  Safety maintained with 15 minute checks Patient stated he slept 6 hours.

## 2023-08-21 NOTE — Progress Notes (Signed)
   08/20/23 2107  Psych Admission Type (Psych Patients Only)  Admission Status Voluntary  Psychosocial Assessment  Patient Complaints Anxiety  Eye Contact Brief  Facial Expression Anxious  Affect Appropriate to circumstance  Speech Logical/coherent  Interaction Assertive  Motor Activity Slow  Appearance/Hygiene Unremarkable  Behavior Characteristics Cooperative;Appropriate to situation  Mood Pleasant;Anxious  Thought Process  Coherency WDL  Content WDL  Delusions None reported or observed  Perception WDL  Hallucination None reported or observed  Judgment Poor  Confusion None  Danger to Self  Current suicidal ideation? Denies  Agreement Not to Harm Self Yes  Description of Agreement verbal  Danger to Others  Danger to Others None reported or observed

## 2023-08-22 ENCOUNTER — Encounter (HOSPITAL_COMMUNITY): Payer: Self-pay

## 2023-08-22 DIAGNOSIS — F39 Unspecified mood [affective] disorder: Secondary | ICD-10-CM | POA: Diagnosis not present

## 2023-08-22 MED ORDER — RISPERIDONE 3 MG PO TABS
3.0000 mg | ORAL_TABLET | Freq: Every day | ORAL | Status: DC
  Administered 2023-08-22 – 2023-08-23 (×2): 3 mg via ORAL
  Filled 2023-08-22 (×2): qty 1

## 2023-08-22 MED ORDER — TRAZODONE HCL 100 MG PO TABS
100.0000 mg | ORAL_TABLET | Freq: Every evening | ORAL | Status: DC | PRN
  Administered 2023-08-22 – 2023-08-23 (×2): 100 mg via ORAL
  Filled 2023-08-22 (×2): qty 1

## 2023-08-22 MED ORDER — LAMOTRIGINE 25 MG PO TABS
25.0000 mg | ORAL_TABLET | Freq: Every day | ORAL | Status: DC
  Administered 2023-08-23: 25 mg via ORAL
  Filled 2023-08-22: qty 1

## 2023-08-22 NOTE — Group Note (Signed)
 Recreation Therapy Group Note   Group Topic:Stress Management  Group Date: 08/22/2023 Start Time: 7829 End Time: 1003 Facilitators: Shell Blanchette-McCall, LRT,CTRS Location: [address] Dayroom   Group Topic: Stress Management   Goal Area(s) Addresses:  Patient will actively participate in stress management techniques presented during session.  Patient will successfully identify benefit of practicing stress management post d/c.   Behavioral Response:   Intervention: Relaxation exercise with ambient sound and script   Activity: Guided Imagery. LRT provided education, instruction, and demonstration on practice of visualization via guided imagery. Patient was asked to participate in the technique introduced during session. LRT debriefed including topics of mindfulness, stress management and specific scenarios each patient could use these techniques. Patients were given suggestions of ways to access scripts post d/c and encouraged to explore Youtube and other apps available on smartphones, tablets, and computers.  Education:  Stress Management, Discharge Planning.   Education Outcome: Acknowledges education   Affect/Mood: N/A   Participation Level: Did not attend    Clinical Observations/Individualized Feedback:     Plan: Continue to engage patient in RT group sessions 2-3x/week.   Bryttani Blew-McCall, LRT,CTRS 08/22/2023 1:07 PM

## 2023-08-22 NOTE — [facility] Suicide Risk Assessment (Addendum)
 [facility] INPATIENT:  Family/Significant Other Suicide Prevention Education  Suicide Prevention Education:  Education Completed; [name] (mom) [phone], (name of family member/significant other) has been identified by the patient as the family member/significant other with whom the patient will be residing, and identified as the person(s) who will aid the patient in the event of a mental health crisis (suicidal ideations/suicide attempt).  With written consent from the patient, the family member/significant other has been provided the following suicide prevention education, prior to the and/or following the discharge of the patient.  Mom said that patient is angry about being in the hospital.  He told mom, There is nothing for me here.  I need to get out of here.  Mom said that she bought patient a house 4 years ago and asked him to get a job, but he hadn't work at all during this time.  Mom said the house is paid off, but needs repairs.  Mom also pays for is expenses (food, clothes, medications), and can't afford it.  She said patient will have to live somewhere else, and she will rent the house.  Mom said she went to patient's apartment today and found a loaded gun on the bed.  She removed it from patient's home.  She said she removed other guns in the past, and she believes she removed the last gun today.  Mom said patient has 3 machetes, 20 - 30 sport knives and 3 bows.  Mom said he had never tried to kill himself with knives or sharp objects.  CSW told mom that she will speak with patient about it, and will ask him if mom could go there and remove it. Mom believes that patient wouldn't agree to it, but was willing to go there and remove it, if patient agrees to it.  When asked if she has any safety concerns about patient returning home, she said that patient quit drinking and goes to his doctor appointments, however she worries about his state of mind because he doesn't respond well to her attempts  to help him gain independence.  CSW emailed the resources to [email or web address] [email or web address] via secure email.   The suicide prevention education provided includes the following: Suicide risk factors Suicide prevention and interventions National Suicide Hotline telephone number [facility] assessment telephone number [facility] Emergency Assistance 911 [facility] and/or Residential Mobile Crisis Unit telephone number  Request made of family/significant other to: Remove weapons (e.g., guns, rifles, knives), all items previously/currently identified as safety concern.   Remove drugs/medications (over-the-counter, prescriptions, illicit drugs), all items previously/currently identified as a safety concern.  The family member/significant other verbalizes understanding of the suicide prevention education information provided.  The family member/significant other agrees to remove the items of safety concern listed above.  [name] O Harold Mattes, LCSWA 08/22/2023, 4:52 PM

## 2023-08-22 NOTE — Group Note (Signed)
 Occupational Therapy Group Note  Group Topic: Sleep Hygiene  Group Date: 08/22/2023 Start Time: 1430 End Time: 1500 Facilitators: Lynnda Sas, OT   Group Description: Group encouraged increased participation and engagement through topic focused on sleep hygiene. Patients reflected on the quality of sleep they typically receive and identified areas that need improvement. Group was given background information on sleep and sleep hygiene, including common sleep disorders. Group members also received information on how to improve one's sleep and introduced a sleep diary as a tool that can be utilized to track sleep quality over a length of time. Group session ended with patients identifying one or more strategies they could utilize or implement into their sleep routine in order to improve overall sleep quality.        Therapeutic Goal(s):  Identify one or more strategies to improve overall sleep hygiene  Identify one or more areas of sleep that are negatively impacted (sleep too much, too little, etc)     Participation Level: Engaged   Participation Quality: Independent   Behavior: Appropriate   Speech/Thought Process: Relevant   Affect/Mood: Appropriate   Insight: Fair   Judgement: Fair      Modes of Intervention: Education  Patient Response to Interventions:  Attentive   Plan: Continue to engage patient in OT groups 2 - 3x/week.  08/22/2023  Lynnda Sas, OT   Mykel Sponaugle, OT

## 2023-08-22 NOTE — BH IP Treatment Plan (Signed)
 Interdisciplinary Treatment and Diagnostic Plan Update  08/22/2023 Time of Session: 3:20PM CALIBER LANDESS MRN: [id number]  Principal Diagnosis: Mood disorder [facility])  Secondary Diagnoses: Principal Problem:   Mood disorder (HCC)   Current Medications:  Current Facility-Administered Medications  Medication Dose Route Frequency Provider Last Rate Last Admin   acetaminophen  (TYLENOL ) tablet 650 mg  650 mg Oral Q6H PRN White, Patrice L, NP       alum & mag hydroxide-simeth (MAALOX/MYLANTA) 200-200-20 MG/5ML suspension 30 mL  30 mL Oral Q4H PRN White, Patrice L, NP       haloperidol  (HALDOL ) tablet 5 mg  5 mg Oral TID PRN White, Patrice L, NP       And   diphenhydrAMINE (BENADRYL) capsule 50 mg  50 mg Oral TID PRN White, Patrice L, NP       haloperidol  lactate (HALDOL ) injection 5 mg  5 mg Intramuscular TID PRN White, Patrice L, NP       And   diphenhydrAMINE (BENADRYL) injection 50 mg  50 mg Intramuscular TID PRN White, Patrice L, NP       And   LORazepam  (ATIVAN ) injection 2 mg  2 mg Intramuscular TID PRN White, Patrice L, NP       haloperidol  lactate (HALDOL ) injection 10 mg  10 mg Intramuscular TID PRN White, Patrice L, NP       And   diphenhydrAMINE (BENADRYL) injection 50 mg  50 mg Intramuscular TID PRN White, Patrice L, NP       And   LORazepam  (ATIVAN ) injection 2 mg  2 mg Intramuscular TID PRN White, Patrice L, NP       hydrOXYzine  (ATARAX ) tablet 25 mg  25 mg Oral TID PRN White, Patrice L, NP   25 mg at 08/21/23 2107   magnesium  hydroxide (MILK OF MAGNESIA) suspension 30 mL  30 mL Oral Daily PRN White, Patrice L, NP       naltrexone  (DEPADE) tablet 50 mg  50 mg Oral Daily White, Patrice L, NP   50 mg at 08/22/23 8295   PARoxetine  (PAXIL ) tablet 60 mg  60 mg Oral Daily White, Patrice L, NP   60 mg at 08/22/23 6213   propranolol  (INDERAL ) tablet 30 mg  30 mg Oral BID White, Patrice L, NP   30 mg at 08/22/23 0865   risperiDONE  (RISPERDAL ) tablet 1 mg  1 mg Oral Q1200 Chien, Stephanie,  MD   1 mg at 08/22/23 1312   risperiDONE  (RISPERDAL ) tablet 3 mg  3 mg Oral QHS Zouev, Dmitri, MD       traZODone  (DESYREL ) tablet 100 mg  100 mg Oral QHS PRN Zouev, Dmitri, MD       PTA Medications: Medications Prior to Admission  Medication Sig Dispense Refill Last Dose/Taking   naltrexone  (DEPADE) 50 MG tablet Take 1 tablet (50 mg total) by mouth daily. 30 tablet 1    PARoxetine  (PAXIL ) 30 MG tablet Take 2 tablets (60 mg total) by mouth daily. 60 tablet 1    propranolol  (INDERAL ) 10 MG tablet Take 3 tablets (30 mg total) by mouth 2 (two) times daily. 180 tablet 1    risperiDONE  (RISPERDAL ) 1 MG tablet Take 1 tablet (1 mg total) by mouth 2 (two) times daily. 60 tablet 1     Patient Stressors: Financial difficulties   Occupational concerns    Patient Strengths: Ability for insight  Capable of independent living  Motivation for treatment/growth  Supportive family/friends   Treatment Modalities: Medication Management,  Group therapy, Case management,  1 to 1 session with clinician, Psychoeducation, Recreational therapy.   Physician Treatment Plan for Primary Diagnosis: Mood disorder (HCC) Long Term Goal(s):     Short Term Goals:    Medication Management: Evaluate patient's response, side effects, and tolerance of medication regimen.  Therapeutic Interventions: 1 to 1 sessions, Unit Group sessions and Medication administration.  Evaluation of Outcomes: Not Progressing  Physician Treatment Plan for Secondary Diagnosis: Principal Problem:   Mood disorder (HCC)  Long Term Goal(s):     Short Term Goals:       Medication Management: Evaluate patient's response, side effects, and tolerance of medication regimen.  Therapeutic Interventions: 1 to 1 sessions, Unit Group sessions and Medication administration.  Evaluation of Outcomes: Not Progressing   RN Treatment Plan for Primary Diagnosis: Mood disorder (HCC) Long Term Goal(s): Knowledge of disease and therapeutic regimen to  maintain health will improve  Short Term Goals: Ability to remain free from injury will improve, Ability to verbalize frustration and anger appropriately will improve, Ability to demonstrate self-control, Ability to participate in decision making will improve, Ability to verbalize feelings will improve, Ability to disclose and discuss suicidal ideas, Ability to identify and develop effective coping behaviors will improve, and Compliance with prescribed medications will improve  Medication Management: RN will administer medications as ordered by provider, will assess and evaluate patient's response and provide education to patient for prescribed medication. RN will report any adverse and/or side effects to prescribing provider.  Therapeutic Interventions: 1 on 1 counseling sessions, Psychoeducation, Medication administration, Evaluate responses to treatment, Monitor vital signs and CBGs as ordered, Perform/monitor CIWA, COWS, AIMS and Fall Risk screenings as ordered, Perform wound care treatments as ordered.  Evaluation of Outcomes: Not Progressing   LCSW Treatment Plan for Primary Diagnosis: Mood disorder (HCC) Long Term Goal(s): Safe transition to appropriate next level of care at discharge, Engage patient in therapeutic group addressing interpersonal concerns.  Short Term Goals: Engage patient in aftercare planning with referrals and resources, Increase social support, Increase ability to appropriately verbalize feelings, Increase emotional regulation, Facilitate acceptance of mental health diagnosis and concerns, Facilitate patient progression through stages of change regarding substance use diagnoses and concerns, Identify triggers associated with mental health/substance abuse issues, and Increase skills for wellness and recovery  Therapeutic Interventions: Assess for all discharge needs, 1 to 1 time with Social worker, Explore available resources and support systems, Assess for adequacy in  community support network, Educate family and significant other(s) on suicide prevention, Complete Psychosocial Assessment, Interpersonal group therapy.  Evaluation of Outcomes: Not Progressing   Progress in Treatment: Attending groups: Yes. Participating in groups: No. Taking medication as prescribed: Yes. Toleration medication: Yes. Family/Significant other contact made: No, will contact:  Mother, Blake Vetrano Patient understands diagnosis: Yes. Discussing patient identified problems/goals with staff: Yes. Medical problems stabilized or resolved: Yes. Denies suicidal/homicidal ideation: Yes. Issues/concerns per patient self-inventory: No. Other: none  New problem(s) identified: No, Describe:  None  New Short Term/Long Term Goal(s): medication stabilization, elimination of SI thoughts, development of comprehensive mental wellness plan.    Patient Goals:  release me  Discharge Plan or Barriers: Patient recently admitted. CSW will continue to follow and assess for appropriate referrals and possible discharge planning.    Reason for Continuation of Hospitalization: Anxiety Medication stabilization Other; describe Mood stabilization, discharge planning  Estimated Length of Stay: 3-5 DAYS  Last 3 [place] Suicide Severity Risk Score: Flowsheet Row [facility] (Current[facility] Most recent reading at 08/19/2023 10:00 PM ED from 08/19/2023 in [facility] Most recent reading at 08/19/2023  1:48 PM ED from 05/10/2022 in [facility] Most recent reading at 05/10/2022  5:48 PM  C-SSRS RISK CATEGORY High Risk High Risk No Risk    Last PHQ 2/9 Scores:    12/16/2022    2:42 PM 05/17/2022   12:00 PM 05/16/2022   11:47 AM  Depression screen PHQ 2/9  Decreased Interest 0 0 0  Down, Depressed, Hopeless 0 1 1  PHQ - 2 Score 0 1 1  Altered sleeping  1 0  Tired, decreased energy  1 2   Change in appetite  0 1  Feeling bad or failure about yourself   2 3  Trouble concentrating  0 0  Moving slowly or fidgety/restless  0 0  Suicidal thoughts  0 0  PHQ-9 Score  5 7  Difficult doing work/chores  Not difficult at all     Scribe for Treatment Team: Yazen Rosko N Faithlynn Deeley, LCSW 08/22/2023 2:09 PM

## 2023-08-22 NOTE — Progress Notes (Signed)
   08/22/23 0800  Psych Admission Type (Psych Patients Only)  Admission Status Voluntary  Psychosocial Assessment  Patient Complaints Anxiety  Eye Contact Brief  Facial Expression Anxious  Affect Appropriate to circumstance  Speech Logical/coherent  Interaction Assertive  Motor Activity Slow  Appearance/Hygiene Unremarkable  Behavior Characteristics Cooperative;Appropriate to situation  Mood Anxious  Aggressive Behavior  Effect No apparent injury  Thought Process  Coherency WDL  Content WDL  Delusions WDL  Perception WDL  Hallucination None reported or observed  Judgment WDL  Confusion WDL  Danger to Self  Current suicidal ideation? Denies  Agreement Not to Harm Self Yes  Danger to Others  Danger to Others None reported or observed

## 2023-08-22 NOTE — Group Note (Signed)
 Date:  08/22/2023 Time:  10:34 AM  Group Topic/Focus:  Emotional Education:   The focus of this group is to discuss what feelings/emotions are, and how they are experienced. Goals Group:   The focus of this group is to help patients establish daily goals to achieve during treatment and discuss how the patient can incorporate goal setting into their daily lives to aide in recovery. Orientation:   The focus of this group is to educate the patient on the purpose and policies of crisis stabilization and provide a format to answer questions about their admission.  The group details unit policies and expectations of patients while admitted.    Participation Level:  Did Not Attend   [name] 08/22/2023, 10:34 AM

## 2023-08-22 NOTE — Plan of Care (Signed)
  Problem: Coping: Goal: Ability to verbalize frustrations and anger appropriately will improve Outcome: Progressing   Problem: Safety: Goal: Periods of time without injury will increase Outcome: Progressing   Problem: Self-Concept: Goal: Ability to disclose and discuss suicidal ideas will improve Outcome: Progressing

## 2023-08-22 NOTE — Progress Notes (Signed)
   08/22/23 2230  Psych Admission Type (Psych Patients Only)  Admission Status Voluntary  Psychosocial Assessment  Patient Complaints Anxiety  Eye Contact Brief  Facial Expression Anxious  Affect Appropriate to circumstance  Speech Logical/coherent  Interaction Assertive  Motor Activity Slow  Appearance/Hygiene Unremarkable  Behavior Characteristics Cooperative  Mood Anxious  Aggressive Behavior  Effect No apparent injury  Thought Process  Coherency WDL  Content WDL  Delusions WDL  Perception WDL  Hallucination None reported or observed  Judgment WDL  Confusion WDL  Danger to Self  Current suicidal ideation? Denies  Danger to Others  Danger to Others None reported or observed

## 2023-08-22 NOTE — Plan of Care (Signed)
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
## 2023-08-22 NOTE — Group Note (Signed)
 Date:  08/22/2023 Time:  1:20 AM  Group Topic/Focus:  Wrap-Up Group:   The focus of this group is to help patients review their daily goal of treatment and discuss progress on daily workbooks.    Participation Level:  Minimal  Participation Quality:  Appropriate  Affect:  Appropriate  Cognitive:  Appropriate  Insight: Improving  Engagement in Group:  Limited  Modes of Intervention:  Discussion  Additional Comments:  Pt attended the evening wrap-up group. Tech introduced the staff for the evening, reminded group of the evening schedule and reminded them to ask for anything they need. Pt read and discussed a poem named Autobiography in [employer]. Pt shared when he agreed with shared perspectives.  Leanor Proper Everado Pillsbury 08/22/2023, 1:20 AM

## 2023-08-23 ENCOUNTER — Ambulatory Visit: Payer: MEDICAID | Admitting: Family Medicine

## 2023-08-23 DIAGNOSIS — F39 Unspecified mood [affective] disorder: Secondary | ICD-10-CM | POA: Diagnosis not present

## 2023-08-23 MED ORDER — DIVALPROEX SODIUM ER 250 MG PO TB24
750.0000 mg | ORAL_TABLET | Freq: Every day | ORAL | Status: DC
  Administered 2023-08-23: 750 mg via ORAL
  Filled 2023-08-23: qty 3

## 2023-08-23 NOTE — Group Note (Signed)
 LCSW Group Therapy Note   Group Date: 08/23/2023 Start Time: 1100 End Time: 1200   Participation:  did not attend  Type of Therapy:  Group Therapy  Topic:  Understanding Your Path to Change  Objective:  The goal is to help individuals understand the stages of change, identify where they currently are in the process, and provide actionable next steps to continue moving forward in their journey of change.  Goals: Learn about the six stages of change:  Precontemplation, Contemplation, Preparation, Action, Maintenance, and Relapse Reflect on Current Change Efforts:  Recognize which stage participants are in regarding a personal change. Plan Next Steps for Moving Forward:  Create an action plan based on their current stage of change.  Class Summary:  In this session, we explored the Stages of Change as a framework to understand the process of change.  We discussed how each stage helps individuals recognize where they are in their personal journey and used the Stages of Change Worksheet for self-reflection. Participants answered questions to better understand their current stage, challenges, and progress. We also emphasized the importance of moving forward, even if setbacks (Relapse) occur, and created actionable steps to help participants continue progressing. By the end of the session, participants gained a clearer understanding of their path to change and left with a clear plan for next steps.  Therapeutic Modalities:  Elements of CBT (cognitive restructuring, problem solving)  Element of DBT (mindfulness, distress tolerance)   Minha Fulco O Taronda Comacho, LCSWA 08/23/2023  12:31 PM

## 2023-08-23 NOTE — Plan of Care (Signed)
  Problem: Activity: Goal: Interest or engagement in activities will improve Outcome: Progressing   Problem: Coping: Goal: Ability to verbalize frustrations and anger appropriately will improve Outcome: Progressing   Problem: Coping: Goal: Coping ability will improve Outcome: Progressing

## 2023-08-23 NOTE — Progress Notes (Addendum)
 Conversation with patient:  Patient reported that he participates in weekly therapy appointments on Tuesdays.  Today's appointment was at 2 PM.  CSW gave patient contact information: Therapist:  [facility] [address] Zada Herrlich [place], [place] [address] [phone]  Patient said that he left detailed voicemail.  Patient said that she will be the first person he will call after he leaves the hospital.   Kallista Pae, LCSWA 08/23/2023

## 2023-08-23 NOTE — Progress Notes (Signed)
   08/23/23 0900  Psych Admission Type (Psych Patients Only)  Admission Status Voluntary  Psychosocial Assessment  Patient Complaints Anxiety  Eye Contact Fair  Facial Expression Anxious  Affect Appropriate to circumstance  Speech Logical/coherent  Interaction Assertive  Motor Activity Slow  Appearance/Hygiene Unremarkable  Behavior Characteristics Cooperative;Appropriate to situation  Mood Anxious  Aggressive Behavior  Effect No apparent injury  Thought Process  Coherency WDL  Content WDL  Delusions WDL  Perception WDL  Hallucination None reported or observed  Judgment WDL  Confusion WDL  Danger to Self  Current suicidal ideation? Denies  Danger to Others  Danger to Others None reported or observed

## 2023-08-23 NOTE — Group Note (Signed)
 Recreation Therapy Group Note   Group Topic:Animal Assisted Therapy   Group Date: 08/23/2023 Start Time: 0102 End Time: 1035 Facilitators: Brinae Woods-McCall, LRT,CTRS Location: [address] Dayroom   Animal-Assisted Activity (AAA) Program Checklist/Progress Notes Patient Eligibility Criteria Checklist & Daily Group note for Rec Tx Intervention  AAA/T Program Assumption of Risk Form signed by Patient/ or Parent Legal Guardian Yes  Patient understands his/her participation is voluntary Yes  Behavioral Response:    Education: Charity fundraiser, Appropriate Animal Interaction   Education Outcome: Acknowledges education.    Affect/Mood: N/A   Participation Level: Did not attend    Clinical Observations/Individualized Feedback:     Plan: Continue to engage patient in RT group sessions 2-3x/week.   Dotti Busey-McCall, LRT,CTRS 08/23/2023 1:36 PM

## 2023-08-23 NOTE — Plan of Care (Signed)
   Problem: Education: Goal: Emotional status will improve Outcome: Progressing Goal: Mental status will improve Outcome: Progressing   Problem: Activity: Goal: Interest or engagement in activities will improve Outcome: Progressing Goal: Sleeping patterns will improve Outcome: Progressing

## 2023-08-23 NOTE — Progress Notes (Signed)
   08/23/23 2215  Psych Admission Type (Psych Patients Only)  Admission Status Voluntary  Psychosocial Assessment  Patient Complaints Anxiety  Eye Contact Brief  Facial Expression Anxious  Affect Appropriate to circumstance  Speech Logical/coherent  Interaction Assertive  Motor Activity Slow  Appearance/Hygiene Unremarkable  Behavior Characteristics Cooperative  Mood Anxious  Aggressive Behavior  Effect No apparent injury  Thought Process  Coherency WDL  Content WDL  Delusions WDL  Perception WDL  Hallucination None reported or observed  Judgment WDL  Confusion WDL  Danger to Self  Current suicidal ideation? Denies  Danger to Others  Danger to Others None reported or observed

## 2023-08-23 NOTE — Group Note (Signed)
 Date:  08/23/2023 Time:  11:00 AM  Group Topic/Focus:  Coping With Mental Health Crisis:   The purpose of this group is to help patients identify strategies for coping with mental health crisis.  Group discusses possible causes of crisis and ways to manage them effectively. Goals Group:   The focus of this group is to help patients establish daily goals to achieve during treatment and discuss how the patient can incorporate goal setting into their daily lives to aide in recovery. Orientation:   The focus of this group is to educate the patient on the purpose and policies of crisis stabilization and provide a format to answer questions about their admission.  The group details unit policies and expectations of patients while admitted.    Participation Level:  Did Not Attend   [name] 08/23/2023, 11:00 AM

## 2023-08-23 NOTE — Progress Notes (Addendum)
 Collateral contact - [name] (mom)   CSW emailed mom a list of group homes per her request [email or web address] via secure email.   Conversation with [name]: CSW asked [name] if mom could remove machetes, sport knives and bows from his apartment, but [name] didn't agree to it.  CSW told [name] that she will continue this conversation with him.  [name] said he is ready to leave the hospital.  A few hours later CSW spoke with [name], and he agreed that his mom remove sharp objects (machetes, sport knives and bows, etc) from his apartment.   Krystian Younglove, LCSWA 08/23/2023
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
## 2023-08-23 NOTE — Group Note (Unsigned)
 Date:  08/23/2023 Time:  8:30 PM  Group Topic/Focus:  Wrap-Up Group:   The focus of this group is to help patients review their daily goal of treatment and discuss progress on daily workbooks.     Participation Level:  {BHH PARTICIPATION ZOXWR:60454}  Participation Quality:  {BHH PARTICIPATION QUALITY:22265}  Affect:  {BHH AFFECT:22266}  Cognitive:  {BHH COGNITIVE:22267}  Insight: {BHH Insight2:20797}  Engagement in Group:  {BHH ENGAGEMENT IN UJWJX:91478}  Modes of Intervention:  {BHH MODES OF INTERVENTION:22269}  Additional Comments:  ***  Debi Fall 08/23/2023, 8:30 PM

## 2023-08-24 DIAGNOSIS — F39 Unspecified mood [affective] disorder: Secondary | ICD-10-CM | POA: Diagnosis not present

## 2023-08-24 MED ORDER — RISPERIDONE 2 MG PO TABS: ORAL_TABLET | ORAL | 0 refills | Status: DC

## 2023-08-24 MED ORDER — TRAZODONE HCL 100 MG PO TABS: 100.0000 mg | ORAL_TABLET | Freq: Every evening | ORAL | 0 refills | Status: DC | PRN

## 2023-08-24 MED ORDER — HYDROXYZINE HCL 25 MG PO TABS: 25.0000 mg | ORAL_TABLET | Freq: Three times a day (TID) | ORAL | 0 refills | Status: AC | PRN

## 2023-08-24 MED ORDER — DIVALPROEX SODIUM ER 250 MG PO TB24
750.0000 mg | ORAL_TABLET | Freq: Every day | ORAL | 0 refills | Status: DC
Start: 2023-08-24 — End: 2023-09-15

## 2023-08-24 NOTE — Progress Notes (Signed)
 Patient discharged to home via taxi. Discharge instructions, all required discharge documents and information about follow-up appointment given to pt with verbalization of understanding. All personal belongings returned to pt at time of discharge. Pt escorted to lobby by RN at 1320.  08/24/23 0905  Psych Admission Type (Psych Patients Only)  Admission Status Voluntary  Psychosocial Assessment  Patient Complaints Anxiety  Eye Contact Fair  Facial Expression Anxious  Affect Appropriate to circumstance  Speech Logical/coherent  Interaction Assertive  Motor Activity Slow  Appearance/Hygiene Unremarkable  Behavior Characteristics Cooperative  Mood Anxious  Aggressive Behavior  Effect No apparent injury  Thought Process  Coherency WDL  Content WDL  Delusions None reported or observed  Perception WDL  Hallucination None reported or observed  Judgment WDL  Confusion WDL  Danger to Self  Current suicidal ideation? Denies  Agreement Not to Harm Self Yes  Description of Agreement Verbal  Danger to Others  Danger to Others None reported or observed

## 2023-08-24 NOTE — Transportation (Signed)
 08/24/2023  [name] DOB: 03-27-1980 MRN: [id number]   RIDER WAIVER AND RELEASE OF LIABILITY  For the purposes of helping with transportation needs, [facility] partners with outside transportation providers (taxi companies, [place], Catering manager.) to give [facility] patients or other approved people the choice of on-demand rides Public librarian) to our buildings for non-emergency visits.  By using [employer], I, the person signing this document, on behalf of myself and/or any legal minors (in my care using the [employer]), agree:  Science writer given to me are supplied by independent, outside transportation providers who do not work for, or have any affiliation with, [employer]. [facility] is not a transportation company. [facility] has no control over the quality or safety of the rides I get using [employer]. [facility] has no control over whether any outside ride will happen on time or not. [facility] gives no guarantee on the reliability, quality, safety, or availability on any rides, or that no mistakes will happen. I know and accept that traveling by vehicle (car, truck, SVU, [employer] Chi, bus, taxi, etc.) has risks of serious injuries such as disability, being paralyzed, and death. I know and agree the risk of using [employer] is mine alone, and not [employer]. [employer] are provided as is and as are available. The transportation providers are in charge for all inspections and care of the vehicles used to provide these rides. I agree not to take legal action against [facility], its agents, employees, officers, directors, representatives, insurers, attorneys, assigns, successors, subsidiaries, and affiliates at any time for any reasons related directly or indirectly to using [employer]. I also agree not to take legal action against [facility] or its affiliates for any injury, death, or damage to property caused by or related to using  [employer]. I have read this Waiver and Release of Liability, and I understand the terms used in it and their legal meaning. This Waiver is freely and voluntarily given with the understanding that my right (or any legal minors) to legal action against  relating to [employer] is knowingly given up to use these services.   I attest that I read the Ride Waiver and Release of Liability to [name], gave Mr. Sipp the opportunity to ask questions and answered the questions asked (if any). I affirm that [name] then provided consent for assistance with transportation.      Bellanie Matthew, LCSWA 08/24/2023

## 2023-08-24 NOTE — Plan of Care (Signed)
   Problem: Activity: Goal: Interest or engagement in activities will improve Outcome: Progressing Goal: Sleeping patterns will improve Outcome: Progressing   Problem: Health Behavior/Discharge Planning: Goal: Identification of resources available to assist in meeting health care needs will improve Outcome: Progressing Goal: Compliance with treatment plan for underlying cause of condition will improve Outcome: Progressing   Problem: Safety: Goal: Periods of time without injury will increase Outcome: Progressing

## 2023-08-24 NOTE — Discharge Summary (Signed)
 Physician Discharge Summary Note  Patient:  [name] is an 43 y.o., male MRN:  [id number] DOB:  1980-11-25 Patient phone:  [phone] (home)  Patient address:   [address] [place] [address],  Total Time spent with patient: {Time; 15 min - 8 hours:17441}  Date of Admission:  08/19/2023 Date of Discharge: ***  Reason for Admission:  ***  Principal Problem: Mood disorder [facility]) Discharge Diagnoses: Principal Problem:   Mood disorder (HCC)   Past Psychiatric History: ***  Past Medical History:  Past Medical History:  Diagnosis Date   Alcoholism (HCC)    Anemia    Anxiety    Depression     Past Surgical History:  Procedure Laterality Date   arm tendon surgery     Family History:  Family History  Problem Relation Age of Onset   Colon cancer Neg Hx    Stomach cancer Neg Hx    Esophageal cancer Neg Hx    Family Psychiatric  History: *** Social History:  Social History   Substance and Sexual Activity  Alcohol Use Not Currently     Social History   Substance and Sexual Activity  Drug Use Yes   Types: Marijuana, Oxycodone    Social History   Socioeconomic History   Marital status: Single    Spouse name: Not on file   Number of children: Not on file   Years of education: Not on file   Highest education level: Not on file  Occupational History   Not on file  Tobacco Use   Smoking status: Former    Types: Cigarettes   Smokeless tobacco: Never  Vaping Use   Vaping status: Never Used  Substance and Sexual Activity   Alcohol use: Not Currently   Drug use: Yes    Types: Marijuana, Oxycodone   Sexual activity: Not Currently  Other Topics Concern   Not on file  Social History Narrative   Not on file   Social Drivers of Health   Financial Resource Strain: High Risk (04/01/2021)   Overall Financial Resource Strain (CARDIA)    Difficulty of Paying Living Expenses: Hard  Food Insecurity: No Food Insecurity (08/19/2023)   Hunger Vital Sign     Worried About Running Out of Food in the Last Year: Never true    Ran Out of Food in the Last Year: Never true  Transportation Needs: Patient Declined (08/19/2023)   PRAPARE - Transportation    Lack of Transportation (Medical): Patient declined    Lack of Transportation (Non-Medical): Patient declined  Physical Activity: Sufficiently Active (04/01/2021)   Exercise Vital Sign    Days of Exercise per Week: 7 days    Minutes of Exercise per Session: 150+ min  Stress: Stress Concern Present (04/01/2021)   [employer] of Occupational Health - Occupational Stress Questionnaire    Feeling of Stress : Very much  Social Connections: Socially Isolated (04/01/2021)   Social Connection and Isolation Panel    Frequency of Communication with Friends and Family: More than three times a week    Frequency of Social Gatherings with Friends and Family: Twice a week    Attends Religious Services: Never    Database administrator or Organizations: No    Attends Banker Meetings: Never    Marital Status: Never married    Hospital Course:  ***  Physical Findings: AIMS:  , ,  ,  ,  ,  ,   CIWA:    COWS:  Musculoskeletal: Strength & Muscle Tone: {desc; muscle tone:32375} Gait & Station: {PE GAIT ED WUJW:11914} Patient leans: {Patient Leans:21022755}   Psychiatric Specialty Exam:  Presentation  General Appearance:  Appropriate for Environment  Eye Contact: Fair  Speech: Clear and Coherent  Speech Volume: Normal  Handedness: Right   Mood and Affect  Mood: Euthymic  Affect: Appropriate   Thought Process  Thought Processes: Goal Directed  Descriptions of Associations:Intact  Orientation:Full (Time, Place and Person)  Thought Content:Logical  History of Schizophrenia/Schizoaffective disorder:No  Duration of Psychotic Symptoms:No data recorded Hallucinations:Hallucinations: None  Ideas of Reference:None  Suicidal Thoughts:Suicidal Thoughts:  No  Homicidal Thoughts:Homicidal Thoughts: No   Sensorium  Memory: Immediate Fair  Judgment: Fair  Insight: Fair   Art therapist  Concentration: Fair  Attention Span: Fair  Recall: [employer] of Knowledge: Fair  Language: Fair   Psychomotor Activity  Psychomotor Activity: Psychomotor Activity: Normal   Assets  Assets: Communication Skills; Desire for Improvement; Housing; Leisure Time; Physical Health; Social Support   Sleep  Sleep: Sleep: Fair  Estimated Sleeping Duration (Last 24 Hours): 6.75-8.25 hours   Physical Exam: Physical Exam ROS Blood pressure 111/78, pulse 74, temperature 98 F (36.7 C), temperature source Oral, resp. rate 16, height 5' 6 (1.676 m), weight 70.5 kg, SpO2 99%. Body mass index is 25.08 kg/m.   Social History   Tobacco Use  Smoking Status Former   Types: Cigarettes  Smokeless Tobacco Never   Tobacco Cessation:  {Discharge tobacco cessation prescription:304700209}   Blood Alcohol level:  Lab Results  Component Value Date   ETH <15 08/19/2023   ETH 281 (H) 01/25/2017    Metabolic Disorder Labs:  Lab Results  Component Value Date   HGBA1C 5.7 (H) 08/19/2023   MPG 116.89 08/19/2023   MPG 120 05/10/2022   No results found for: PROLACTIN Lab Results  Component Value Date   CHOL 170 08/19/2023   TRIG 101 08/19/2023   HDL 51 08/19/2023   CHOLHDL 3.3 08/19/2023   VLDL 20 08/19/2023   LDLCALC 99 08/19/2023   LDLCALC 124 (H) 05/10/2022    See Psychiatric Specialty Exam and Suicide Risk Assessment completed by Attending Physician prior to discharge.  Discharge destination:  {DISCHARGE DESTINATION:22616}  Is patient on multiple antipsychotic therapies at discharge:  {RECOMMEND TAPERING:22617}   Has Patient had three or more failed trials of antipsychotic monotherapy by history:  {BHH ANTIPSYCHOTIC:22903}  Recommended Plan for Multiple Antipsychotic Therapies: {BHH MULTIPLE ANTIPSYCHOTIC  THERAPIES:22905}   Allergies as of 08/24/2023   No Known Allergies   Med Rec must be completed prior to using this [facility]***       Follow-up Information     [facility]. Go on 09/06/2023.   Specialty: Behavioral Health Why: Please go to this provider on 09/06/23 at 1:00 pm for medication management services.  You also have an appointment for therapy services with Paige 10/11/23 @ 11:00 am, in person. Contact information: [address] [place]  [address] [phone]        House, [place]. Schedule an appointment as soon as possible for a visit.   Contact information: [address] [place] [address] [phone]                 Follow-up recommendations:  {BHH DC FU RECOMMENDATIONS:22620}  Comments:  ***  Signed: Vinton Layson, MD 08/24/2023, 10:15 AM

## 2023-08-24 NOTE — Group Note (Unsigned)
 Date:  08/24/2023 Time:  9:07 AM  Group Topic/Focus:  Goals Group:   The focus of this group is to help patients establish daily goals to achieve during treatment and discuss how the patient can incorporate goal setting into their daily lives to aide in recovery.     Participation Level:  {BHH PARTICIPATION ZOXWR:60454}  Participation Quality:  {BHH PARTICIPATION QUALITY:22265}  Affect:  {BHH AFFECT:22266}  Cognitive:  {BHH COGNITIVE:22267}  Insight: {BHH Insight2:20797}  Engagement in Group:  {BHH ENGAGEMENT IN UJWJX:91478}  Modes of Intervention:  {BHH MODES OF INTERVENTION:22269}  Additional Comments:  ***  Tita Form 08/24/2023, 9:07 AM

## 2023-08-24 NOTE — Group Note (Signed)
 Date:  08/24/2023 Time:  11:14 AM  Group Topic/Focus:  Goals Group:   The focus of this group is to help patients establish daily goals to achieve during treatment and discuss how the patient can incorporate goal setting into their daily lives to aide in recovery.    Participation Level:  Did Not Attend  Participation Quality:    Affect:    Cognitive:    Insight:   Engagement in Group:    Modes of Intervention:    Additional Comments:  Pt were aware of group time. Pt refused to attend group.  Tita Form 08/24/2023, 11:14 AM

## 2023-08-24 NOTE — BHH Suicide Risk Assessment (Addendum)
 BHH INPATIENT:  Family/Significant Other Suicide Prevention Education  Suicide Prevention Education:  Education Completed; with patient,  (name of family member/significant other) has been identified by the patient as the family member/significant other with whom the patient will be residing, and identified as the person(s) who will aid the patient in the event of a mental health crisis (suicidal ideations/suicide attempt).  With written consent from the patient, the family member/significant other has been provided the following suicide prevention education, prior to the and/or following the discharge of the patient.  Patient was given Suicide Prevention Information brochure.  When asked about guns, he said the guns are out of the picture.  He said he keeps his medications in a plastic container/divider in the kitchen (away from heat).  The suicide prevention education provided includes the following: Suicide risk factors Suicide prevention and interventions National Suicide Hotline telephone number [facility] assessment telephone number [facility] Emergency Assistance 911 [facility] and/or Residential Mobile Crisis Unit telephone number  Request made of family/significant other to: Remove weapons (e.g., guns, rifles, knives), all items previously/currently identified as safety concern.   Remove drugs/medications (over-the-counter, prescriptions, illicit drugs), all items previously/currently identified as a safety concern.  The family member/significant other verbalizes understanding of the suicide prevention education information provided.  The family member/significant other agrees to remove the items of safety concern listed above.  [name], LCSWA 08/24/2023, 12:11 PM

## 2023-08-24 NOTE — Progress Notes (Signed)
  [facility] Adult Case Management Discharge Plan :  Will you be returning to the same living situation after discharge:  Yes,  patient will return to his apartment.  Patient lives alone. At discharge, do you have transportation home?: Yes,  CSW arranged transportation through [facility] for 1 PM. Do you have the ability to pay for your medications: Yes,  patient has insurance  Release of information consent forms completed and in the chart;  Patient's signature needed at discharge.  Patient to Follow up at:  Follow-up Information     [facility]. Go on 09/06/2023.   Specialty: Behavioral Health Why: Please go to this provider on 09/06/23 at 1:00 pm for medication management services.  You also have an appointment for therapy services with Paige 10/11/23 @ 11:00 am, in person. * Please call if you wish to cancel. Contact information: [address] [place]  [address] [phone]        House, [place]. Schedule an appointment as soon as possible for a visit.   Why: Please call to schedule an appointment, as we were unable to contact prior to your discharge. Contact information: [address] [place] [address] [phone]                 Next level of care provider has access to [facility] Link:no  Safety Planning and Suicide Prevention discussed: Yes,  Hurbert Duran (mom) [phone] and with patient  Has patient been referred to the Quitline?: Patient does not use tobacco/nicotine  products.  Patient said he quit smoking one and a half year ago.  Patient has been referred for addiction treatment: Patient refused referral for treatment.  At admission, patient tested positive for oxycodone and marijuana, but today denied recent use, I don't know where this came from however admitted that he used in the past.  When asked when he used last time, he responded, I don't remember.  CSW asked him if he would speak with his therapist if he  would experience relapse, and he said, yes, my therapist is awesome.     Loana Salvaggio O Rashidah [place], LCSWA 08/24/2023, 12:14 PM

## 2023-08-24 NOTE — BHH Suicide Risk Assessment (Signed)
 [facility] Discharge Suicide Risk Assessment   Principal Problem: Mood disorder [facility]) Discharge Diagnoses: Principal Problem:   Mood disorder (HCC)   Total Time spent with patient: 30 minutes  Musculoskeletal: Strength & Muscle Tone: within normal limits Gait & Station: normal Patient leans: N/A  Psychiatric Specialty Exam  Presentation  General Appearance:  Appropriate for Environment  Eye Contact: Fair  Speech: Clear and Coherent  Speech Volume: Normal  Handedness: Right   Mood and Affect  Mood: Depressed  Duration of Depression Symptoms: Greater than two weeks  Affect: Congruent   Thought Process  Thought Processes: Coherent  Descriptions of Associations:Intact  Orientation:Full (Time, Place and Person)  Thought Content:Logical  History of Schizophrenia/Schizoaffective disorder:No  Duration of Psychotic Symptoms:No data recorded Hallucinations:No data recorded Ideas of Reference:None  Suicidal Thoughts:No data recorded Homicidal Thoughts:No data recorded  Sensorium  Memory: Immediate Fair; Recent Fair; Remote Fair  Judgment: Intact  Insight: Present   Executive Functions  Concentration: Poor  Attention Span: Fair  Recall: [employer] of Knowledge: Fair  Language: Fair   Psychomotor Activity  Psychomotor Activity:No data recorded  Assets  Assets: Communication Skills; Desire for Improvement; Housing; Leisure Time; Physical Health   Sleep  Sleep:No data recorded Estimated Sleeping Duration (Last 24 Hours): 6.75-8.25 hours  Physical Exam: Physical Exam ROS Blood pressure 111/78, pulse 74, temperature 98 F (36.7 C), temperature source Oral, resp. rate 16, height 5' 6 (1.676 m), weight 70.5 kg, SpO2 99%. Body mass index is 25.08 kg/m.  Mental Status Per Nursing Assessment::   On Admission:  Suicidal ideation indicated by patient  Demographic Factors:  Male, Caucasian, Living alone, and Unemployed  Loss  Factors: Financial problems/change in socioeconomic status  Historical Factors: Prior suicide attempts  Risk Reduction Factors:   Sense of responsibility to family, Positive social support, Positive therapeutic relationship, and Positive coping skills or problem solving skills  Continued Clinical Symptoms:  Previous Psychiatric Diagnoses and Treatments  Cognitive Features That Contribute To Risk:  None    Suicide Risk:  Minimal: No identifiable suicidal ideation.  Patients presenting with no risk factors but with morbid ruminations; may be classified as minimal risk based on the severity of the depressive symptoms  Patient denies SI or HI for >48 hours. Denies wanting to be dead and future oriented. SRA complete and acute risk for suicide is low.   [place] Suicide Risk assessment:  1. Do you wish to be dead? NONE REPORTED 2. Have you wished your dead or wished you could go to sleep and not wake up? NONE REPORTED 3.  Have you actually had thoughts of killing yourself?  NONE REPORTED 4.  Have you been thinking about how you might do this?  NONE REPORTED 5.  Have you had these thoughts and some intention of acting on them? NONE REPORTED 6.  Have you started to work out or worked out the details to kill yourself? NONE REPORTED 7.  Do you intend to carry out this plan? NONE REPORTED 8. On a scale of 1-5 with 1 being the least severe and 5 being the most severe answer the following questions place for intensity of ideation. ZERO 9. How many times have you had these thoughts? NONE REPORTED 10. When you have the thoughts how long to the last?  NONE REPORTED 11. Control ability.  Could you or can you stop thinking about killing herself or wanting to die if you want to?  YES 12. Are there any things anyone or anything family religion  pain of death that stop you from wanting to die or acting on thoughts of committing suicide?  FAMILY 13.  What sort of reason to do have to think about wanting  to die or killing yourself? NONE REPORTED 14.Was it to end the pain or stop the way you are feeling in other words you could not go on living with his pain or how you are feeling or was not to get attention revenge or reaction from others?  Or both?  NONE REPORTED  [place] Suicide Risk assessment:  1. Do you wish to be dead? NONE REPORTED 2. Have you wished your dead or wished you could go to sleep and not wake up? NONE REPORTED 3.  Have you actually had thoughts of killing yourself?  NONE REPORTED 4.  Have you been thinking about how you might do this?  NONE REPORTED 5.  Have you had these thoughts and some intention of acting on them? NONE REPORTED 6.  Have you started to work out or worked out the details to kill yourself? NONE REPORTED 7.  Do you intend to carry out this plan? NONE REPORTED 8. On a scale of 1-5 with 1 being the least severe and 5 being the most severe answer the following questions place for intensity of ideation. ZERO 9. How many times have you had these thoughts? NONE REPORTED 10. When you have the thoughts how long to the last?  NONE REPORTED 11. Control ability.  Could you or can you stop thinking about killing herself or wanting to die if you want to?  YES 12. Are there any things anyone or anything family religion pain of death that stop you from wanting to die or acting on thoughts of committing suicide?  FAMILY, my mom 13.  What sort of reason to do have to think about wanting to die or killing yourself? NONE REPORTED 14.Was it to end the pain or stop the way you are feeling in other words you could not go on living with his pain or how you are feeling or was not to get attention revenge or reaction from others?  Or both?  NONE REPORTED    Follow-up Information     [facility]. Go on 09/06/2023.   Specialty: Behavioral Health Why: Please go to this provider on 09/06/23 at 1:00 pm for medication management services.  You also have an  appointment for therapy services with Paige 10/11/23 @ 11:00 am, in person. Contact information: [address] [place]  [address] [phone]        House, [place]. Schedule an appointment as soon as possible for a visit.   Contact information: [address] [place] [address] [phone]                 Plan Of Care/Follow-up recommendations:  Outpatient followup as above  Bethanny Toelle, MD 08/24/2023, 10:14 AM

## 2023-08-24 NOTE — BHH Suicide Risk Assessment (Addendum)
 BHH INPATIENT:  Family/Significant Other Suicide Prevention Education  Suicide Prevention Education:  Education Completed; Duval Macleod (mother [phone],  (name of family member/significant other)   Mom said that yesterday she removed sharp objects (machetes, sports knives, bows and arrows).  She found hand grenade so she called non-emergency police department, and the police removed it.  This morning mom went there again and checked the attic but there were no concerns.  Mom said she worries that patient will be upset because she removed all these times.  Mom said she emailed patient informing him about the rules in the home (the home belongs to her, and he lives alone there).      Eligio Angert O Sonny Poth, LCSWA 08/24/2023, 12:07 PM

## 2023-09-04 ENCOUNTER — Other Ambulatory Visit: Payer: Self-pay | Admitting: Family Medicine

## 2023-09-04 DIAGNOSIS — I10 Essential (primary) hypertension: Secondary | ICD-10-CM

## 2023-09-06 ENCOUNTER — Telehealth: Payer: Self-pay

## 2023-09-06 ENCOUNTER — Encounter (HOSPITAL_COMMUNITY): Payer: MEDICAID | Admitting: Psychiatry

## 2023-09-06 NOTE — Telephone Encounter (Signed)
 Looks like medication was discontinued to stop taking at discharge on 08/19/23 and is on propranolol , will need to wait for upcoming visit on 7/3 to discuss BP medication changes

## 2023-09-06 NOTE — Telephone Encounter (Signed)
 Copied from CRM [id number]. Topic: Clinical - Medication Question >> Sep 06, 2023  3:41 PM Mesmerise C wrote: Reason for CRM: Patient needing prescription for Lisinopril  20 mg to be filled showing discontinued went to pharmacy said needing doctor approval patient can be reached on MyChart or [phone]

## 2023-09-08 ENCOUNTER — Ambulatory Visit: Payer: Self-pay | Admitting: Family Medicine

## 2023-09-08 ENCOUNTER — Other Ambulatory Visit: Payer: MEDICAID

## 2023-09-08 ENCOUNTER — Ambulatory Visit: Payer: MEDICAID | Admitting: Family Medicine

## 2023-09-08 VITALS — BP 126/78 | HR 72 | Temp 97.6°F | Ht 66.0 in | Wt 156.0 lb

## 2023-09-08 DIAGNOSIS — I10 Essential (primary) hypertension: Secondary | ICD-10-CM

## 2023-09-08 DIAGNOSIS — E559 Vitamin D deficiency, unspecified: Secondary | ICD-10-CM | POA: Diagnosis not present

## 2023-09-08 DIAGNOSIS — D649 Anemia, unspecified: Secondary | ICD-10-CM

## 2023-09-08 DIAGNOSIS — R7303 Prediabetes: Secondary | ICD-10-CM

## 2023-09-08 DIAGNOSIS — R61 Generalized hyperhidrosis: Secondary | ICD-10-CM

## 2023-09-08 DIAGNOSIS — F39 Unspecified mood [affective] disorder: Secondary | ICD-10-CM

## 2023-09-08 LAB — CBC WITH DIFFERENTIAL/PLATELET
Basophils Absolute: 0.1 10*3/uL (ref 0.0–0.1)
Basophils Relative: 0.9 % (ref 0.0–3.0)
Eosinophils Absolute: 0.2 10*3/uL (ref 0.0–0.7)
Eosinophils Relative: 3.3 % (ref 0.0–5.0)
HCT: 37.5 % — ABNORMAL LOW (ref 39.0–52.0)
Hemoglobin: 12.8 g/dL — ABNORMAL LOW (ref 13.0–17.0)
Lymphocytes Relative: 27.4 % (ref 12.0–46.0)
Lymphs Abs: 1.9 10*3/uL (ref 0.7–4.0)
MCHC: 34.2 g/dL (ref 30.0–36.0)
MCV: 88.1 fl (ref 78.0–100.0)
Monocytes Absolute: 0.7 10*3/uL (ref 0.1–1.0)
Monocytes Relative: 10.6 % (ref 3.0–12.0)
Neutro Abs: 4.1 10*3/uL (ref 1.4–7.7)
Neutrophils Relative %: 57.8 % (ref 43.0–77.0)
Platelets: 295 10*3/uL (ref 150.0–400.0)
RBC: 4.26 Mil/uL (ref 4.22–5.81)
RDW: 13 % (ref 11.5–15.5)
WBC: 7 10*3/uL (ref 4.0–10.5)

## 2023-09-08 LAB — VITAMIN B12: Vitamin B-12: 627 pg/mL (ref 211–911)

## 2023-09-08 LAB — FOLATE: Folate: >23.2 ng/mL (ref >5.9)

## 2023-09-08 LAB — VITAMIN D 25 HYDROXY (VIT D DEFICIENCY, FRACTURES): VITD: 48.54 ng/mL (ref 30.00–100.00)

## 2023-09-08 LAB — TSH: TSH: 1.28 u[IU]/mL (ref 0.35–5.50)

## 2023-09-08 LAB — T4, FREE: Free T4: 0.93 ng/dL (ref 0.60–1.60)

## 2023-09-08 LAB — FERRITIN: Ferritin: 133 ng/mL (ref 22.0–322.0)

## 2023-09-08 MED ORDER — LISINOPRIL 20 MG PO TABS: 20.0000 mg | ORAL_TABLET | Freq: Every day | ORAL | 1 refills | Status: DC

## 2023-09-08 NOTE — Progress Notes (Signed)
 Subjective:     Patient ID: [name], male    DOB: [birth year], 43 y.o.   MRN: [id number]  Chief Complaint  Patient presents with   Medical Management of Chronic Issues    6 month f/u, new rx for lisinopril ? Was d/c at hospital in March     HPI  History of Present Illness          He is here to follow up on chronic health conditions including hypertension, prediabetes, vitamin D  deficiency.  He was recently hospitalized for mood disorder and other behavioral health issues. Reports living at home alone and doing well now.  Medications prescribed by psychiatry.  [facility]  He walked to his appointment today.  He does not drive.  Looking for a job.  States he ran out of lisinopril  for HTN. Ran out last week   He ran out of vitamin D  supplement and multivitamin.  Reports sweating a lot.  This has been severe as long as he can remember.    Health Maintenance Due  Topic Date Due   Hepatitis B Vaccines (1 of 3 - 19+ 3-dose series) Never done   HPV VACCINES (1 - 3-dose SCDM series) Never done   COVID-19 Vaccine (3 - Pfizer risk series) 07/24/2019    Past Medical History:  Diagnosis Date   Alcoholism (HCC)    Anemia    Anxiety    Depression     Past Surgical History:  Procedure Laterality Date   arm tendon surgery      Family History  Problem Relation Age of Onset   Colon cancer Neg Hx    Stomach cancer Neg Hx    Esophageal cancer Neg Hx     Social History   Socioeconomic History   Marital status: Single    Spouse name: Not on file   Number of children: Not on file   Years of education: Not on file   Highest education level: Some college, no degree  Occupational History   Not on file  Tobacco Use   Smoking status: Former    Types: Cigarettes   Smokeless tobacco: Never  Vaping Use   Vaping status: Never Used  Substance and Sexual Activity   Alcohol use: Not Currently   Drug use: Yes    Types: Marijuana,  Oxycodone   Sexual activity: Not Currently  Other Topics Concern   Not on file  Social History Narrative   Not on file   Social Drivers of Health   Financial Resource Strain: Medium Risk (09/08/2023)   Overall Financial Resource Strain (CARDIA)    Difficulty of Paying Living Expenses: Somewhat hard  Food Insecurity: Food Insecurity Present (09/08/2023)   Hunger Vital Sign    Worried About Running Out of Food in the Last Year: Sometimes true    Ran Out of Food in the Last Year: Sometimes true  Transportation Needs: Unmet Transportation Needs (09/08/2023)   PRAPARE - Transportation    Lack of Transportation (Medical): No    Lack of Transportation (Non-Medical): Yes  Physical Activity: Insufficiently Active (09/08/2023)   Exercise Vital Sign    Days of Exercise per Week: 4 days    Minutes of Exercise per Session: 30 min  Stress: Stress Concern Present (09/08/2023)   [employer] of Occupational Health - Occupational Stress Questionnaire    Feeling of Stress: To some extent  Social Connections: Socially Isolated (09/08/2023)   Social Connection and Isolation Panel  Frequency of Communication with Friends and Family: Once a week    Frequency of Social Gatherings with Friends and Family: Once a week    Attends Religious Services: Never    Database administrator or Organizations: No    Attends Engineer, structural: Not on file    Marital Status: Never married  Intimate Partner Violence: Patient Declined (08/19/2023)   Humiliation, Afraid, Rape, and Kick questionnaire    Fear of Current or Ex-Partner: Patient declined    Emotionally Abused: Patient declined    Physically Abused: Patient declined    Sexually Abused: Patient declined    Outpatient Medications Prior to Visit  Medication Sig Dispense Refill   divalproex  (DEPAKOTE  ER) 250 MG 24 hr tablet Take 3 tablets (750 mg total) by mouth at bedtime. 90 tablet 0   hydrOXYzine  (ATARAX ) 25 MG tablet Take 1 tablet (25 mg total)  by mouth 3 (three) times daily as needed for anxiety. 30 tablet 0   naltrexone  (DEPADE) 50 MG tablet Take 1 tablet (50 mg total) by mouth daily. 30 tablet 1   PARoxetine  (PAXIL ) 30 MG tablet Take 2 tablets (60 mg total) by mouth daily. 60 tablet 1   propranolol  (INDERAL ) 10 MG tablet Take 3 tablets (30 mg total) by mouth 2 (two) times daily. 180 tablet 1   risperiDONE  (RISPERDAL ) 2 MG tablet Take 1 mg (0.5 tabs) qAM and 3 mg (1.5 tabs) at night 60 tablet 0   traZODone  (DESYREL ) 100 MG tablet Take 1 tablet (100 mg total) by mouth at bedtime as needed for sleep. 30 tablet 0   No facility-administered medications prior to visit.    No Known Allergies  Review of Systems  Constitutional:  Positive for diaphoresis. Negative for chills, fever and malaise/fatigue.  Eyes:  Negative for blurred vision, double vision and photophobia.  Respiratory:  Negative for cough and shortness of breath.   Cardiovascular:  Negative for chest pain, palpitations and leg swelling.  Gastrointestinal:  Negative for abdominal pain, constipation, diarrhea, nausea and vomiting.  Genitourinary:  Negative for dysuria, frequency and urgency.  Musculoskeletal:  Negative for joint pain and myalgias.  Skin:  Negative for itching and rash.  Neurological:  Negative for dizziness, focal weakness and headaches.  Psychiatric/Behavioral:  Positive for depression. Negative for suicidal ideas. The patient is nervous/anxious.        Objective:    Physical Exam Constitutional:      General: He is not in acute distress.    Appearance: He is not ill-appearing.  HENT:     Mouth/Throat:     Mouth: Mucous membranes are moist.     Pharynx: Oropharynx is clear.  Eyes:     Extraocular Movements: Extraocular movements intact.     Conjunctiva/sclera: Conjunctivae normal.  Cardiovascular:     Rate and Rhythm: Normal rate and regular rhythm.  Pulmonary:     Effort: Pulmonary effort is normal.     Breath sounds: Normal breath sounds.   Musculoskeletal:     Cervical back: Normal range of motion and neck supple.     Right lower leg: No edema.     Left lower leg: No edema.  Skin:    General: Skin is warm and dry.     Findings: No rash.  Neurological:     General: No focal deficit present.     Mental Status: He is alert and oriented to person, place, and time.     Cranial Nerves: No cranial nerve deficit.  Motor: No weakness.     Coordination: Coordination normal.     Gait: Gait normal.  Psychiatric:        Attention and Perception: Attention normal.        Mood and Affect: Mood normal.        Speech: Speech normal.        Behavior: Behavior normal.        Thought Content: Thought content normal.      BP 126/78   Pulse 72   Temp 97.6 F (36.4 C) (Temporal)   Ht 5' 6 (1.676 m)   Wt 156 lb (70.8 kg)   SpO2 96%   BMI 25.18 kg/m  Wt Readings from Last 3 Encounters:  09/08/23 156 lb (70.8 kg)  08/19/23 155 lb 6.4 oz (70.5 kg)  08/19/23 158 lb 3.2 oz (71.8 kg)       Assessment & Plan:   Problem List Items Addressed This Visit     Hypertension - Primary   Relevant Medications   lisinopril  (ZESTRIL ) 20 MG tablet   Other Relevant Orders   CBC with Differential/Platelet   TSH   T4, free   Mood disorder (HCC)   Prediabetes   Relevant Orders   CBC with Differential/Platelet   TSH   T4, free   Vitamin D  deficiency   Relevant Orders   VITAMIN D  25 Hydroxy (Vit-D Deficiency, Fractures)   Other Visit Diagnoses       Generalized hyperhidrosis       Relevant Orders   TSH   T4, free     Anemia, unspecified type       Relevant Orders   Ferritin   Folate   Vitamin B12      Hypertension- ran out of lisinopril  1 week ago.  Reviewed recent lab results.  Normal renal function.  Refilled lisinopril . Reviewed hospital discharge summary from hospitalization due to behavioral health.  He will continue seeing psychiatry and therapy.  Reports doing better now. History of anemia.  Check labs and he  will also follow-up with GI as recommended next month. Hyperhidrosis-ongoing and chronic as long as he can remember.  We discussed possible etiologies.  He will stay hydrated.  Check labs including thyroid  function. Check vitamin D  level and follow-up  I am having Redell A. Anspaugh start on lisinopril . I am also having him maintain his PARoxetine , propranolol , naltrexone , hydrOXYzine , risperiDONE , traZODone , and divalproex .  Meds ordered this encounter  Medications   lisinopril  (ZESTRIL ) 20 MG tablet    Sig: Take 1 tablet (20 mg total) by mouth daily.    Dispense:  90 tablet    Refill:  1

## 2023-09-08 NOTE — Patient Instructions (Signed)
 Please go to the [facility] building to get labs.   You may also schedule your GI appointment since they are in the same building.   I will be in touch with your results.

## 2023-09-08 NOTE — Telephone Encounter (Signed)
 Was discussed at visit today 7/3

## 2023-09-13 ENCOUNTER — Ambulatory Visit (HOSPITAL_COMMUNITY): Payer: MEDICAID | Admitting: Student in an Organized Health Care Education/Training Program

## 2023-09-13 VITALS — BP 90/65 | HR 89 | Ht 66.0 in | Wt 158.2 lb

## 2023-09-13 DIAGNOSIS — F3162 Bipolar disorder, current episode mixed, moderate: Secondary | ICD-10-CM

## 2023-09-13 DIAGNOSIS — F429 Obsessive-compulsive disorder, unspecified: Secondary | ICD-10-CM

## 2023-09-13 DIAGNOSIS — F4310 Post-traumatic stress disorder, unspecified: Secondary | ICD-10-CM | POA: Diagnosis not present

## 2023-09-13 DIAGNOSIS — F411 Generalized anxiety disorder: Secondary | ICD-10-CM
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
## 2023-09-14 ENCOUNTER — Telehealth (HOSPITAL_COMMUNITY): Payer: Self-pay

## 2023-09-14 DIAGNOSIS — F1090 Alcohol use, unspecified, uncomplicated: Secondary | ICD-10-CM

## 2023-09-14 DIAGNOSIS — F4310 Post-traumatic stress disorder, unspecified: Secondary | ICD-10-CM

## 2023-09-14 DIAGNOSIS — R4689 Other symptoms and signs involving appearance and behavior: Secondary | ICD-10-CM

## 2023-09-14 DIAGNOSIS — F411 Generalized anxiety disorder: Secondary | ICD-10-CM

## 2023-09-14 DIAGNOSIS — F429 Obsessive-compulsive disorder, unspecified: Secondary | ICD-10-CM
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
## 2023-09-15 ENCOUNTER — Telehealth (HOSPITAL_COMMUNITY): Payer: Self-pay | Admitting: Student in an Organized Health Care Education/Training Program

## 2023-09-15 MED ORDER — PROPRANOLOL HCL 10 MG PO TABS: 30.0000 mg | ORAL_TABLET | Freq: Two times a day (BID) | ORAL | 0 refills | Status: DC

## 2023-09-15 MED ORDER — DIVALPROEX SODIUM ER 250 MG PO TB24: 750.0000 mg | ORAL_TABLET | Freq: Every day | ORAL | 0 refills | Status: DC

## 2023-09-15 MED ORDER — PAROXETINE HCL 30 MG PO TABS: 60.0000 mg | ORAL_TABLET | Freq: Every day | ORAL | 0 refills | Status: DC

## 2023-09-15 MED ORDER — RISPERIDONE 2 MG PO TABS: ORAL_TABLET | ORAL | 0 refills | Status: DC

## 2023-09-15 MED ORDER — NALTREXONE HCL 50 MG PO TABS: 50.0000 mg | ORAL_TABLET | Freq: Every day | ORAL | 0 refills | Status: DC

## 2023-09-15 NOTE — Telephone Encounter (Signed)
 [place], [phone].  He inquired about discussion had with his mother earlier.  Discussed that we recommend at least 6 months of firearms being secure but that there can supervised use sooner at places like a gun range.  He reported understanding of this but was not happy about it.  He reports he did have a good therapy session with Summer.  Discussed with him that medication refills had been sent in.  He reports get messaged by the pharmacy.  He reports no SI, HI, or AVH.    Marolyn Rosser DO
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
## 2023-09-20 ENCOUNTER — Encounter (HOSPITAL_COMMUNITY): Payer: MEDICAID | Admitting: Psychiatry

## 2023-09-23 ENCOUNTER — Encounter: Payer: Self-pay | Admitting: Nurse Practitioner

## 2023-09-23 ENCOUNTER — Ambulatory Visit: Payer: MEDICAID | Admitting: Nurse Practitioner

## 2023-09-23 VITALS — BP 122/80 | HR 87 | Ht 66.0 in | Wt 157.0 lb

## 2023-09-23 DIAGNOSIS — F1091 Alcohol use, unspecified, in remission: Secondary | ICD-10-CM

## 2023-09-23 DIAGNOSIS — D649 Anemia, unspecified: Secondary | ICD-10-CM | POA: Diagnosis not present

## 2023-09-23 NOTE — Progress Notes (Signed)
 09/23/2023 RAMIE ERMAN [id number] [birth year]   Chief Complaint: Anemia follow-up  History of Present Illness: [name] is a 43 year old male with a past medical history of anxiety, OCD, PTSD, bipolar disorder, ADHD, alcohol use disorder and anemia.  Recently admitted to the hospital 6/13  - 08/24/2023 due to having suicidal ideation. He was initially seen in office by Dr. Stacia 03/23/2023 for further evaluation regarding normocytic anemia with normal iron levels.  At that time, laboratory studies showed a normal hemoglobin level of 13.2, normal iron level 94 and ferritin 134. FOBT negative on 04/05/2023.  Endoscopic evaluation was not pursued with normal hemoglobin and iron levels.  His LFTs were elevated with AST 42 and ALT 139.  Prior heavy alcohol use was likely a contributing factor regarding LFTs and suspected bone marrow suppression. Repeat LFTs were normal 05/06/2023 and Dr. Stacia plan on repeating a CBC and hepatic panel in 6 months.  He subsequently had additional labs 08/19/2023 which showed a hemoglobin level 11.9 and repeat CBC 7/3 showed a hemoglobin level 12.8, ferritin 133, vitamin B12 level 627, folate > 23.2 and TSH 1.28.  He presents today for further GI follow-up. He denies having any nausea or vomiting. No heartburn or dysphagia. No upper or lower abdominal pain. He passes a normal brown formed stool once daily. No bloody or black stools. No NSAID use. He stated being abstinent from alcohol x 1 1/2 years. Weight is stable.  He is taking a multivitamin daily.  He is eating 3 healthy meals daily.  He remains active and stated running 2 miles from his home to our office today.  He was admitted to [facility] 08/2023 for 5 days as noted above due to having suicidal ideation.  He stated his depression is well-controlled at this time without recurrent suicidal ideation.  He continues follow-up with his psychiatrist once monthly.     Latest Ref Rng & Units 09/08/2023    11:17 AM 08/19/2023    1:10 PM 03/23/2023   11:20 AM  CBC  WBC 4.0 - 10.5 K/uL 7.0  8.9  8.9   Hemoglobin 13.0 - 17.0 g/dL 87.1  88.0  86.7   Hematocrit 39.0 - 52.0 % 37.5  35.5  39.0   Platelets 150.0 - 400.0 K/uL 295.0  283  311.0         Latest Ref Rng & Units 08/19/2023    1:10 PM 05/06/2023    2:54 PM 03/23/2023   11:20 AM  CMP  Glucose 70 - 99 mg/dL 80  79  71   BUN 6 - 20 mg/dL 19  15  21    Creatinine 0.61 - 1.24 mg/dL 9.05  9.08  9.02   Sodium 135 - 145 mmol/L 135  140  134   Potassium 3.5 - 5.1 mmol/L 4.9  4.8  5.1   Chloride 98 - 111 mmol/L 99  104  98   CO2 22 - 32 mmol/L 26  30  30    Calcium 8.9 - 10.3 mg/dL 9.5  9.4  89.9   Total Protein 6.5 - 8.1 g/dL 6.8  7.3  7.9   Total Bilirubin 0.0 - 1.2 mg/dL 0.5  0.3  0.4   Alkaline Phos 38 - 126 U/L 62  59  68   AST 15 - 41 U/L 19  23  42   ALT 0 - 44 U/L 16  43  139     Current Outpatient Medications on File Prior to  Visit  Medication Sig Dispense Refill   divalproex  (DEPAKOTE  ER) 250 MG 24 hr tablet Take 3 tablets (750 mg total) by mouth at bedtime. 90 tablet 0   hydrOXYzine  (ATARAX ) 25 MG tablet Take 1 tablet (25 mg total) by mouth 3 (three) times daily as needed for anxiety. 30 tablet 0   lisinopril  (ZESTRIL ) 20 MG tablet Take 1 tablet (20 mg total) by mouth daily. 90 tablet 1   naltrexone  (DEPADE) 50 MG tablet Take 1 tablet (50 mg total) by mouth daily. 30 tablet 0   PARoxetine  (PAXIL ) 30 MG tablet Take 2 tablets (60 mg total) by mouth daily. 60 tablet 0   propranolol  (INDERAL ) 10 MG tablet Take 3 tablets (30 mg total) by mouth 2 (two) times daily. 180 tablet 0   risperiDONE  (RISPERDAL ) 2 MG tablet Take 1 mg (0.5 tabs) qAM and 3 mg (1.5 tabs) at night 60 tablet 0   traZODone  (DESYREL ) 100 MG tablet Take 1 tablet (100 mg total) by mouth at bedtime as needed for sleep. 30 tablet 0   No current facility-administered medications on file prior to visit.   No Known Allergies  Current Medications, Allergies, Past Medical  History, Past Surgical History, Family History and Social History were reviewed in [employer] record.  Review of Systems:   Constitutional: Negative for fever, sweats, chills or weight loss.  Respiratory: Negative for shortness of breath.   Cardiovascular: Negative for chest pain, palpitations and leg swelling.  Gastrointestinal: See HPI.  Musculoskeletal: Negative for back pain or muscle aches.  Neurological: Negative for dizziness, headaches or paresthesias.   Physical Exam: BP 122/80   Pulse 87   Ht 5' 6 (1.676 m)   Wt 157 lb (71.2 kg)   BMI 25.34 kg/m   General: 43 year old male in no acute distress. Head: Normocephalic and atraumatic. Eyes: No scleral icterus. Conjunctiva pink . Ears: Normal auditory acuity. Mouth: Dentition intact. Tongue ring in place. No ulcers or lesions.  Lungs: Clear throughout to auscultation. Heart: Regular rate and rhythm, no murmur. Abdomen: Soft, nontender and nondistended. No masses or hepatomegaly. Normal bowel sounds x 4 quadrants.  Rectal: Deferred. Musculoskeletal: Symmetrical with no gross deformities. Extremities: No edema. Neurological: Alert oriented x 4. No focal deficits.  Psychological: Alert and cooperative. Normal mood and affect  Assessment and Recommendations:  43 year old male with normocytic anemia.  Hg level 13.2 -> 11.9 -> 12.8. Normal iron, B12 and folate levels.  FOBT negative.  No overt GI bleeding.  Prior alcohol use likely contributing factor.  Patient stated he has been abstinent from alcohol for 1-1/2 years.  Asymptomatic. - Hematology referral to further evaluate normocytic anemia - Patient to follow-up with Dr. Stacia in 3 months, if anemia persistent to consider endoscopic evaluation  History of LFTs, likely secondary to prior alcohol use. Normal LFTs 2/28 and 08/19/2023.  Abstinent from alcohol x 1 1/2 years. On Naltrexone . - Continue complete alcohol abstinence - CMP as previously  ordered by Dr. Stacia 11/2023 - Patient will require abdominal sonogram and further hepatology serologies if LFTs elevation recur  History of depression with recent admission to behavioral health secondary to suicidal ideation 08/2023 - Continue follow-up with psychiatrist

## 2023-09-23 NOTE — Patient Instructions (Addendum)
 Continue alcohol abstinence.  Please follow up with Dr. Stacia in 3 months.  _______________________________________________________  If your blood pressure at your visit was 140/90 or greater, please contact your primary care physician to follow up on this.  _______________________________________________________  If you are age 42 or older, your body mass index should be between 23-30. Your Body mass index is 25.34 kg/m. If this is out of the aforementioned range listed, please consider follow up with your Primary Care Provider.  If you are age 38 or younger, your body mass index should be between 19-25. Your Body mass index is 25.34 kg/m. If this is out of the aformentioned range listed, please consider follow up with your Primary Care Provider.   ________________________________________________________  The [facility] providers would like to encourage you to use MYCHART to communicate with providers for non-urgent requests or questions.  Due to long hold times on the telephone, sending your provider a message by [facility] may be a faster and more efficient way to get a response.  Please allow 48 business hours for a response.  Please remember that this is for non-urgent requests.  _______________________________________________________

## 2023-10-11 ENCOUNTER — Ambulatory Visit (HOSPITAL_COMMUNITY): Payer: MEDICAID | Admitting: Clinical

## 2023-10-12 ENCOUNTER — Telehealth (HOSPITAL_COMMUNITY): Payer: Self-pay

## 2023-10-12 DIAGNOSIS — F4310 Post-traumatic stress disorder, unspecified: Secondary | ICD-10-CM

## 2023-10-12 DIAGNOSIS — F411 Generalized anxiety disorder: Secondary | ICD-10-CM

## 2023-10-12 DIAGNOSIS — R4689 Other symptoms and signs involving appearance and behavior: Secondary | ICD-10-CM

## 2023-10-12 DIAGNOSIS — F1090 Alcohol use, unspecified, uncomplicated: Secondary | ICD-10-CM

## 2023-10-12 NOTE — Telephone Encounter (Addendum)
 Hello,   Pt/ Pharmacy is requesting for a refill of patients DEPAKOTE  ER) 250 MG 24 hr tablets, risperiDONE  (RISPERDAL ) 2 MG tablets, Paxil , Inderal , and Depade.     Last fiil date: 07/10 Next APP: 08/19 JNL

## 2023-10-13 MED ORDER — NALTREXONE HCL 50 MG PO TABS: 50.0000 mg | ORAL_TABLET | Freq: Every day | ORAL | 0 refills | Status: DC

## 2023-10-13 MED ORDER — RISPERIDONE 2 MG PO TABS: ORAL_TABLET | ORAL | 0 refills | Status: DC

## 2023-10-13 MED ORDER — PROPRANOLOL HCL 10 MG PO TABS: 30.0000 mg | ORAL_TABLET | Freq: Two times a day (BID) | ORAL | 0 refills | Status: DC

## 2023-10-13 MED ORDER — DIVALPROEX SODIUM ER 250 MG PO TB24: 750.0000 mg | ORAL_TABLET | Freq: Every day | ORAL | 0 refills | Status: DC

## 2023-10-13 MED ORDER — PAROXETINE HCL 30 MG PO TABS
60.0000 mg | ORAL_TABLET | Freq: Every day | ORAL | 0 refills | Status: DC
Start: 2023-10-13 — End: 2023-10-25

## 2023-10-13 NOTE — Telephone Encounter (Signed)
 Received message from patient's pharmacy that he needed refills of his medications these were sent in.  Sent: -Continue Paxil  60 mg daily for anxiety and OCD.  60 (30 mg) tablets with 0 refills. -Continue Propanolol 30 mg BID for anxiety and HTN. 180 (10 mg) tablets with 0 refills -Continue Depakote  750 mg daily for mood stability. 90 (250 mg) tablets with 0 refills. -Continue Risperdal  1 mg AM and 3 mg QHS for mood stability. 60 (2 mg) tablets with 0 refills. -Continue Naltrexone  50 mg daily.  30 tablets with 0 tablets.   Marolyn Rosser DO

## 2023-10-13 NOTE — Addendum Note (Signed)
 Addended by: Casten Floren S on: 10/13/2023 07:32 AM   Modules accepted: Orders

## 2023-10-24 ENCOUNTER — Inpatient Hospital Stay: Payer: MEDICAID

## 2023-10-24 VITALS — BP 105/71 | HR 72 | Temp 98.2°F | Resp 18 | Ht 66.0 in | Wt 164.1 lb

## 2023-10-24 DIAGNOSIS — Z79899 Other long term (current) drug therapy: Secondary | ICD-10-CM | POA: Diagnosis not present

## 2023-10-24 DIAGNOSIS — D649 Anemia, unspecified: Secondary | ICD-10-CM | POA: Diagnosis present

## 2023-10-24 DIAGNOSIS — Z87891 Personal history of nicotine dependence: Secondary | ICD-10-CM | POA: Diagnosis not present

## 2023-10-24 NOTE — Progress Notes (Signed)
 [facility] CONSULT NOTE  Patient Care Team: Lendia Boby CROME, NP-C as PCP - General (Family Medicine)  ASSESSMENT & PLAN:  43 y.o. male with history of anxiety, OCD, PTSD, bipolar disorder, ADHD, alcohol use disorder referred to [facility] for anemia.  Records show several occasions of normocytic anemia.  Most recent iron, B12 and folate were normal.  Hemoglobin improved from June to July.  Free T4 was normal.  Bilirubin level have been normal.  Of note, he is on Depakote  started recently.  Clinical he has no concerning symptoms.  Energy is good, overall feeling well.  Discussed continue follow-up with PCP, or psychiatrist to follow-up lab with CBC twice yearly.  Rarely Depakote  may cause cytopenia.  If only mildly affected, there is no concerns in that case.  Discussed importance of avoiding alcohol use in the future. Assessment & Plan Normocytic anemia Borderline, almost normal Follow-up with PCP twice yearly to check CBC. Return if concerning findings  Orders Placed This Encounter  Procedures   Ambulatory referral to Social Work    Referral Priority:   Routine    Referral Type:   Consultation    Referral Reason:   Specialty Services Required    Number of Visits Requested:   1   All questions were answered. The patient knows to call the clinic with any problems, questions or concerns.   Pauletta JAYSON Chihuahua, MD 8/18/20251:32 PM   CHIEF COMPLAINTS/PURPOSE OF CONSULTATION:  Anemia  HISTORY OF PRESENTING ILLNESS:  [name] 43 y.o. male is here because of anemia.  History reported of patient used to drink alcohol but not anymore.  His hemoglobin has been normal before March 2024.  In October 2024 hemoglobin was 12.4 and MCV was normal at 91.  Ferritin was 70, folate 17 and B12 was 349.  Platelet and WBC were normal.  Follow-up lab showed improvement of hemoglobin to 13 with normal MCV in January 2025.  LFT was elevated at that time.   Ferritin was 134 at the time.  Repeat lab in June 2025 showed hemoglobin 11.9.  This was repeated again in July and hemoglobin was 12.8.  At that time ferritin was 133, folate was >23, and B12 was 627.  WBC and platelets were normal.  MCV was 88.  He was referred here by GI after evaluated by them.  No NSAIDs, or aspirin. No bloody stool, or dark stool. No other bleeding. No blood donation.  He had no family history or diagnosis of cancer.   He denies any pica and eats a variety of diet.  He never donated blood or received blood transfusion  He takes multivitamin.   No alcohol for about 2 years.   MEDICAL HISTORY:  Past Medical History:  Diagnosis Date   Alcoholism (HCC)    Anemia    Anxiety    Depression     SURGICAL HISTORY: Past Surgical History:  Procedure Laterality Date   arm tendon surgery      SOCIAL HISTORY: Social History   Socioeconomic History   Marital status: Single    Spouse name: Not on file   Number of children: Not on file   Years of education: Not on file   Highest education level: Some college, no degree  Occupational History   Not on file  Tobacco Use   Smoking status: Former    Types: Cigarettes   Smokeless tobacco: Never  Vaping Use   Vaping status: Never Used  Substance  and Sexual Activity   Alcohol use: Not Currently   Drug use: Yes    Types: Marijuana, Oxycodone   Sexual activity: Not Currently  Other Topics Concern   Not on file  Social History Narrative   Not on file   Social Drivers of Health   Financial Resource Strain: Medium Risk (09/08/2023)   Overall Financial Resource Strain (CARDIA)    Difficulty of Paying Living Expenses: Somewhat hard  Food Insecurity: No Food Insecurity (10/24/2023)   Hunger Vital Sign    Worried About Running Out of Food in the Last Year: Never true    Ran Out of Food in the Last Year: Never true  Recent Concern: Food Insecurity - Food Insecurity Present (09/08/2023)   Hunger Vital Sign    Worried  About Running Out of Food in the Last Year: Sometimes true    Ran Out of Food in the Last Year: Sometimes true  Transportation Needs: No Transportation Needs (10/24/2023)   PRAPARE - Administrator, Civil Service (Medical): No    Lack of Transportation (Non-Medical): No  Recent Concern: Transportation Needs - Unmet Transportation Needs (09/08/2023)   PRAPARE - Transportation    Lack of Transportation (Medical): No    Lack of Transportation (Non-Medical): Yes  Physical Activity: Insufficiently Active (09/08/2023)   Exercise Vital Sign    Days of Exercise per Week: 4 days    Minutes of Exercise per Session: 30 min  Stress: Stress Concern Present (09/08/2023)   [employer] of Occupational Health - Occupational Stress Questionnaire    Feeling of Stress: To some extent  Social Connections: Socially Isolated (09/08/2023)   Social Connection and Isolation Panel    Frequency of Communication with Friends and Family: Once a week    Frequency of Social Gatherings with Friends and Family: Once a week    Attends Religious Services: Never    Database administrator or Organizations: No    Attends Engineer, structural: Not on file    Marital Status: Never married  Intimate Partner Violence: Not At Risk (10/24/2023)   Humiliation, Afraid, Rape, and Kick questionnaire    Fear of Current or Ex-Partner: No    Emotionally Abused: No    Physically Abused: No    Sexually Abused: No    FAMILY HISTORY: Family History  Problem Relation Age of Onset   Colon cancer Neg Hx    Stomach cancer Neg Hx    Esophageal cancer Neg Hx     ALLERGIES:  has no known allergies.  MEDICATIONS:  Current Outpatient Medications  Medication Sig Dispense Refill   divalproex  (DEPAKOTE  ER) 250 MG 24 hr tablet Take 3 tablets (750 mg total) by mouth at bedtime. 90 tablet 0   hydrOXYzine  (ATARAX ) 25 MG tablet Take 1 tablet (25 mg total) by mouth 3 (three) times daily as needed for anxiety. 30 tablet 0    lisinopril  (ZESTRIL ) 20 MG tablet Take 1 tablet (20 mg total) by mouth daily. 90 tablet 1   naltrexone  (DEPADE) 50 MG tablet Take 1 tablet (50 mg total) by mouth daily. 30 tablet 0   PARoxetine  (PAXIL ) 30 MG tablet Take 2 tablets (60 mg total) by mouth daily. 60 tablet 0   propranolol  (INDERAL ) 10 MG tablet Take 3 tablets (30 mg total) by mouth 2 (two) times daily. 180 tablet 0   risperiDONE  (RISPERDAL ) 2 MG tablet Take 1 mg (0.5 tabs) qAM and 3 mg (1.5 tabs) at night 60 tablet 0  traZODone  (DESYREL ) 100 MG tablet Take 1 tablet (100 mg total) by mouth at bedtime as needed for sleep. 30 tablet 0   No current facility-administered medications for this visit.    REVIEW OF SYSTEMS:   All relevant systems were reviewed with the patient and are negative.  PHYSICAL EXAMINATION:  Vitals:   10/24/23 1314  BP: 105/71  Pulse: 72  Resp: 18  Temp: 98.2 F (36.8 C)  SpO2: 100%   Filed Weights   10/24/23 1314  Weight: 164 lb 1.6 oz (74.4 kg)    GENERAL: alert, no distress and comfortable SKIN: skin color normal EYES: normal, sclera clear OROPHARYNX: no exudate, no erythema, and tongue normal and not enlarged NECK: supple LYMPH:  no palpable cervical, axillary lymphadenopathy LUNGS: clear to auscultation with normal breathing effort HEART: regular rate & rhythm and no murmurs and no lower extremity edema ABDOMEN: abdomen soft, non-tender and nondistended   I reviewed his relevant lab results.

## 2023-10-24 NOTE — Assessment & Plan Note (Signed)
 Borderline, almost normal Follow-up with PCP twice yearly to check CBC. Return if concerning findings

## 2023-10-25 ENCOUNTER — Ambulatory Visit (INDEPENDENT_AMBULATORY_CARE_PROVIDER_SITE_OTHER): Payer: MEDICAID | Admitting: Student in an Organized Health Care Education/Training Program

## 2023-10-25 ENCOUNTER — Telehealth: Payer: Self-pay

## 2023-10-25 ENCOUNTER — Encounter (HOSPITAL_COMMUNITY): Payer: Self-pay | Admitting: Student in an Organized Health Care Education/Training Program

## 2023-10-25 DIAGNOSIS — F1090 Alcohol use, unspecified, uncomplicated: Secondary | ICD-10-CM | POA: Diagnosis not present

## 2023-10-25 DIAGNOSIS — R4689 Other symptoms and signs involving appearance and behavior: Secondary | ICD-10-CM

## 2023-10-25 DIAGNOSIS — F411 Generalized anxiety disorder: Secondary | ICD-10-CM

## 2023-10-25 DIAGNOSIS — F4310 Post-traumatic stress disorder, unspecified: Secondary | ICD-10-CM | POA: Diagnosis not present

## 2023-10-25 MED ORDER — PROPRANOLOL HCL 10 MG PO TABS
30.0000 mg | ORAL_TABLET | Freq: Two times a day (BID) | ORAL | 1 refills | Status: DC
Start: 2023-10-25 — End: 2023-12-20

## 2023-10-25 MED ORDER — PAROXETINE HCL 30 MG PO TABS
60.0000 mg | ORAL_TABLET | Freq: Every day | ORAL | 1 refills | Status: DC
Start: 2023-10-25 — End: 2023-12-20

## 2023-10-25 MED ORDER — NALTREXONE HCL 50 MG PO TABS: 50.0000 mg | ORAL_TABLET | Freq: Every day | ORAL | 1 refills | Status: DC

## 2023-10-25 MED ORDER — TRAZODONE HCL 100 MG PO TABS: 100.0000 mg | ORAL_TABLET | Freq: Every evening | ORAL | 1 refills | Status: DC | PRN

## 2023-10-25 MED ORDER — DIVALPROEX SODIUM ER 250 MG PO TB24: 750.0000 mg | ORAL_TABLET | Freq: Every day | ORAL | 1 refills | Status: DC

## 2023-10-25 MED ORDER — RISPERIDONE 2 MG PO TABS: ORAL_TABLET | ORAL | 1 refills | Status: DC

## 2023-10-25 NOTE — Progress Notes (Signed)
 [facility] MD/PA/NP OP Progress Note  10/25/2023 1:28 PM ABHIJOT STRAUGHTER  MRN:  [id number]  Chief Complaint:  Chief Complaint  Patient presents with   Follow-up   Depression   HPI:  Shahzaib Azevedo is a 43 yr old male who presents for Follow Up and Medication Management.  PPHx is significant for GAD, OCD, Insomnia, Chronic PTSD, EtOH Use Disorder, Bipolar Disorder, SAD, and ADHD, and 1 Suicide Attempt (cut self with knife, teenager), past Psychiatric Hospitalizations (last- [facility] 2025), and has been to [facility]), and no history of Self Injurious Behavior.    He reports things have been alright since his last appointment.  He reports that he has started packing to move out of the house.  He reports that move in day is the 28th.  He reports that the stress from this has been significant.  He reports that there is one good thing about this that he does not have to move out of the house that day as they have time to move everything out so they can take their time.  He reports once he moves in to the apartment he will have internet so he can start applying for jobs.  He reports his sleep has been poor for the past week due to the stress of moving.  He reports his appetite is fine.  He reports no SI, HI, or AVH.  He reports no other concerns at present.  He will return for follow up in approximately 2 months.   Visit Diagnosis:    ICD-10-CM   1. Aggression  R46.89 risperiDONE  (RISPERDAL ) 2 MG tablet    divalproex  (DEPAKOTE  ER) 250 MG 24 hr tablet    2. GAD (generalized anxiety disorder)  F41.1 propranolol  (INDERAL ) 10 MG tablet    PARoxetine  (PAXIL ) 30 MG tablet    3. PTSD (post-traumatic stress disorder)  F43.10 propranolol  (INDERAL ) 10 MG tablet    PARoxetine  (PAXIL ) 30 MG tablet    4. Alcohol use disorder  F10.90 naltrexone  (DEPADE) 50 MG tablet              Past Psychiatric History: GAD, OCD, Insomnia, Chronic PTSD, EtOH Use Disorder, Bipolar Disorder, SAD, and ADHD, and  1 Suicide Attempt (cut self with knife, teenager), past Psychiatric Hospitalizations (last- [facility] 2025), and has been to [facility]), and no history of Self Injurious Behavior.   Past Medical History:  Past Medical History:  Diagnosis Date   Alcoholism (HCC)    Anemia    Anxiety    Depression     Past Surgical History:  Procedure Laterality Date   arm tendon surgery      Family Psychiatric History: Father- Cocaine Use No Known Diagnosis' or Suicides  Family History:  Family History  Problem Relation Age of Onset   Colon cancer Neg Hx    Stomach cancer Neg Hx    Esophageal cancer Neg Hx     Social History:  Social History   Socioeconomic History   Marital status: Single    Spouse name: Not on file   Number of children: Not on file   Years of education: Not on file   Highest education level: Some college, no degree  Occupational History   Not on file  Tobacco Use   Smoking status: Former    Types: Cigarettes   Smokeless tobacco: Never  Vaping Use   Vaping status: Never Used  Substance and Sexual Activity   Alcohol use: Not Currently  Drug use: Yes    Types: Marijuana, Oxycodone   Sexual activity: Not Currently  Other Topics Concern   Not on file  Social History Narrative   Not on file   Social Drivers of Health   Financial Resource Strain: Medium Risk (09/08/2023)   Overall Financial Resource Strain (CARDIA)    Difficulty of Paying Living Expenses: Somewhat hard  Food Insecurity: No Food Insecurity (10/24/2023)   Hunger Vital Sign    Worried About Running Out of Food in the Last Year: Never true    Ran Out of Food in the Last Year: Never true  Recent Concern: Food Insecurity - Food Insecurity Present (09/08/2023)   Hunger Vital Sign    Worried About Running Out of Food in the Last Year: Sometimes true    Ran Out of Food in the Last Year: Sometimes true  Transportation Needs: No Transportation Needs (10/24/2023)   PRAPARE -  Administrator, Civil Service (Medical): No    Lack of Transportation (Non-Medical): No  Recent Concern: Transportation Needs - Unmet Transportation Needs (09/08/2023)   PRAPARE - Transportation    Lack of Transportation (Medical): No    Lack of Transportation (Non-Medical): Yes  Physical Activity: Insufficiently Active (09/08/2023)   Exercise Vital Sign    Days of Exercise per Week: 4 days    Minutes of Exercise per Session: 30 min  Stress: Stress Concern Present (09/08/2023)   [employer] of Occupational Health - Occupational Stress Questionnaire    Feeling of Stress: To some extent  Social Connections: Socially Isolated (09/08/2023)   Social Connection and Isolation Panel    Frequency of Communication with Friends and Family: Once a week    Frequency of Social Gatherings with Friends and Family: Once a week    Attends Religious Services: Never    Database administrator or Organizations: No    Attends Engineer, structural: Not on file    Marital Status: Never married    Allergies: No Known Allergies  Metabolic Disorder Labs: Lab Results  Component Value Date   HGBA1C 5.7 (H) 08/19/2023   MPG 116.89 08/19/2023   MPG 120 05/10/2022   No results found for: PROLACTIN Lab Results  Component Value Date   CHOL 170 08/19/2023   TRIG 101 08/19/2023   HDL 51 08/19/2023   CHOLHDL 3.3 08/19/2023   VLDL 20 08/19/2023   LDLCALC 99 08/19/2023   LDLCALC 124 (H) 05/10/2022   Lab Results  Component Value Date   TSH 1.28 09/08/2023   TSH 0.969 08/19/2023    Therapeutic Level Labs: No results found for: LITHIUM No results found for: VALPROATE No results found for: CBMZ  Current Medications: Current Outpatient Medications  Medication Sig Dispense Refill   divalproex  (DEPAKOTE  ER) 250 MG 24 hr tablet Take 3 tablets (750 mg total) by mouth at bedtime. 90 tablet 1   hydrOXYzine  (ATARAX ) 25 MG tablet Take 1 tablet (25 mg total) by mouth 3 (three) times  daily as needed for anxiety. 30 tablet 0   lisinopril  (ZESTRIL ) 20 MG tablet Take 1 tablet (20 mg total) by mouth daily. 90 tablet 1   naltrexone  (DEPADE) 50 MG tablet Take 1 tablet (50 mg total) by mouth daily. 30 tablet 1   PARoxetine  (PAXIL ) 30 MG tablet Take 2 tablets (60 mg total) by mouth daily. 60 tablet 1   propranolol  (INDERAL ) 10 MG tablet Take 3 tablets (30 mg total) by mouth 2 (two) times daily. 180 tablet 1  risperiDONE  (RISPERDAL ) 2 MG tablet Take 1 mg (0.5 tabs) qAM and 3 mg (1.5 tabs) at night 60 tablet 1   traZODone  (DESYREL ) 100 MG tablet Take 1 tablet (100 mg total) by mouth at bedtime as needed for sleep. 30 tablet 1   No current facility-administered medications for this visit.     Musculoskeletal: Strength & Muscle Tone: within normal limits Gait & Station: normal Patient leans: N/A  Psychiatric Specialty Exam: Review of Systems  Respiratory:  Negative for cough and shortness of breath.   Cardiovascular:  Negative for chest pain.  Gastrointestinal:  Negative for abdominal pain, constipation, diarrhea, nausea and vomiting.  Neurological:  Negative for weakness and headaches.  Psychiatric/Behavioral:  Positive for dysphoric mood. Negative for hallucinations and suicidal ideas. The patient is not nervous/anxious.     Blood pressure 99/65, pulse 73, height 5' 6 (1.676 m), weight 166 lb 3.2 oz (75.4 kg), SpO2 98%.Body mass index is 26.83 kg/m.  General Appearance: Casual and Fairly Groomed  Eye Contact:  Good  Speech:  Clear and Coherent and Normal Rate  Volume:  Normal  Mood:  Dysphoric  Affect:  Congruent and Flat  Thought Process:  Coherent and Goal Directed  Orientation:  Full (Time, Place, and Person)  Thought Content: WDL and Logical   Suicidal Thoughts:  No  Homicidal Thoughts:  No  Memory:  Immediate;   Good Recent;   Good  Judgement:  Fair  Insight:  Fair  Psychomotor Activity:  Normal  Concentration:  Concentration: Good and Attention Span: Good   Recall:  Good  Fund of Knowledge: Good  Language: Good  Akathisia:  Negative  Handed:  Right  AIMS (if indicated): not done  Assets:  Physical Health Resilience  ADL's:  Intact  Cognition: WNL  Sleep:  Poor due to packing/moving   Screenings: AIMS    Flowsheet Row [facility]  AIMS Total Score 0   AUDIT    Flowsheet Row [facility] Counselor from [facility]01/2021 in [facility] ([facility]  Alcohol Use Disorder Identification Test Final Score (AUDIT) 0 13 9   GAD-7    Flowsheet Row Counselor from 11/10/2021 in [facility] Counselor from 09/23/2021 in [facility] Counselor from 09/01/2021 in [facility] Counselor from 08/04/2021 in [facility] Counselor from 07/07/2021 in [facility]  Total GAD-7 Score 8 5 9 6 18    PHQ2-9    Flowsheet Row Office Visit from 10/24/2023 in [facility] [place] Office Visit from 12/16/2022 in [facility] ED from 05[facility]10/2022 in [facility] Counselor from 11/10/2021 in [facility] Counselor from 09/23/2021 in [facility]  PHQ-2 Total Score 1 0 1 0 0  PHQ-9 Total Score -- -- 5 3 5    Flowsheet Row Ad[facility]) [facility] Most recent reading at 08/19/2023 10:00 PM ED from 08/19/2023 in [facility] Most recent reading at 08/19/2023  1:48 PM ED from 05/10/2022 in [facility] Most recent reading at  05/10/2022  5:48 PM  C-SSRS RISK CATEGORY High Risk High Risk  No Risk     Assessment and Plan:  Kaylon Hitz is a 43 yr old male who presents for Follow Up and Medication Management.  PPHx is significant for GAD, OCD, Insomnia, Chronic PTSD, EtOH Use Disorder, Bipolar Disorder, SAD, and ADHD, and 1 Suicide Attempt (cut self with knife, teenager), past Psychiatric Hospitalizations (last- [facility] 2025), and has been to [facility] 06[facility]), and no history of Self Injurious Behavior.   Debra has remained stable since his last visit.  He is current stressed about his upcoming move but is managing fairly well.  We will not make any changes to his medications at this time.  Refills were sent in.  He will return for follow up in approximately 2 months.   OCD  GAD  Chronic PTSD  Bipolar Disorder by Hx : -Continue Paxil  60 mg daily for anxiety and OCD.  60 (30 mg) tablets with 1 refill. -Continue Propanolol 30 mg BID for anxiety and HTN. 180 (10 mg) tablets with 1 refill. -Continue Depakote  750 mg daily for mood stability. 90 tablets with 1 refill. -Continue Risperdal  1 mg AM and 3 mg QHS for mood stability. 60 (2 mg) tablets with 1 refill.   EtOH Use Disorder: -Continue Naltrexone  50 mg daily.  30 tablets with 1 refill.    Collaboration of Care:   Patient/Guardian was advised Release of Information must be obtained prior to any record release in order to collaborate their care with an outside provider. Patient/Guardian was advised if they have not already done so to contact the registration department to sign all necessary forms in order for us  to release information regarding their care.   Consent: Patient/Guardian gives verbal consent for treatment and assignment of benefits for services provided during this visit. Patient/Guardian expressed understanding and agreed to proceed.    Marsa GORMAN Rosser, DO 10/25/2023, 1:28 PM

## 2023-10-25 NOTE — Telephone Encounter (Signed)
 [facility] Clinical Social Work  Clinical Social Work was referred by medical provider for advance directives questions.  Clinical Social Worker contacted patient by phone to offer support and assess for needs. Patient was not aware of the referral.    Interventions: Provided education and assistance to client regarding Advanced Directives.       Follow Up Plan:  No follow up scheduled. Patient preferred to call back at a later date to schedule an appointment for the Advance Directive Clinic. CSW provided [facility]'s direct line for appointment scheduling.     Lizbeth Sprague, LCSW  Clinical Social Worker [facility]

## 2023-11-09 ENCOUNTER — Other Ambulatory Visit: Payer: Self-pay

## 2023-11-09 ENCOUNTER — Telehealth: Payer: Self-pay

## 2023-11-09 DIAGNOSIS — D649 Anemia, unspecified: Secondary | ICD-10-CM

## 2023-11-09 DIAGNOSIS — R7989 Other specified abnormal findings of blood chemistry: Secondary | ICD-10-CM

## 2023-11-09 NOTE — Telephone Encounter (Signed)
-----   Message from Nurse [place] B sent at 05/09/2023 12:05 PM EST ----- Regarding: Labs CBC and LFTs - need to order - elevated LFTs, anemia

## 2023-11-09 NOTE — Telephone Encounter (Signed)
 The lab orders have been entered in epic, pt sent mychart message to come for labs.

## 2023-12-20 ENCOUNTER — Ambulatory Visit (INDEPENDENT_AMBULATORY_CARE_PROVIDER_SITE_OTHER): Payer: MEDICAID | Admitting: Student in an Organized Health Care Education/Training Program

## 2023-12-20 VITALS — BP 128/88 | HR 77 | Ht 66.0 in | Wt 171.8 lb

## 2023-12-20 DIAGNOSIS — G4700 Insomnia, unspecified: Secondary | ICD-10-CM | POA: Diagnosis not present

## 2023-12-20 DIAGNOSIS — F411 Generalized anxiety disorder: Secondary | ICD-10-CM | POA: Diagnosis not present

## 2023-12-20 DIAGNOSIS — F4310 Post-traumatic stress disorder, unspecified: Secondary | ICD-10-CM

## 2023-12-20 DIAGNOSIS — F1090 Alcohol use, unspecified, uncomplicated: Secondary | ICD-10-CM | POA: Diagnosis not present

## 2023-12-20 DIAGNOSIS — R4689 Other symptoms and signs involving appearance and behavior: Secondary | ICD-10-CM

## 2023-12-20 MED ORDER — PROPRANOLOL HCL 10 MG PO TABS: 30.0000 mg | ORAL_TABLET | Freq: Two times a day (BID) | ORAL | 1 refills | Status: DC

## 2023-12-20 MED ORDER — PAROXETINE HCL 30 MG PO TABS: 60.0000 mg | ORAL_TABLET | Freq: Every day | ORAL | 1 refills | Status: DC

## 2023-12-20 MED ORDER — DIVALPROEX SODIUM ER 250 MG PO TB24: 750.0000 mg | ORAL_TABLET | Freq: Every day | ORAL | 1 refills | Status: DC

## 2023-12-20 MED ORDER — NALTREXONE HCL 50 MG PO TABS: 50.0000 mg | ORAL_TABLET | Freq: Every day | ORAL | 1 refills | Status: DC

## 2023-12-20 MED ORDER — TRAZODONE HCL 100 MG PO TABS: 100.0000 mg | ORAL_TABLET | Freq: Every evening | ORAL | 1 refills | Status: DC | PRN

## 2023-12-20 MED ORDER — RISPERIDONE 2 MG PO TABS: ORAL_TABLET | ORAL | 1 refills | Status: DC

## 2023-12-20 NOTE — Progress Notes (Signed)
 [facility] MD/PA/NP OP Progress Note  12/20/2023 2:06 PM [name]  MRN:  [id number]  Chief Complaint:  Chief Complaint  Patient presents with   Follow-up   Anxiety   HPI:  [name] is a 43 yr old male who presents for Follow Up and Medication Management.  PPHx is significant for GAD, OCD, Insomnia, Chronic PTSD, EtOH Use Disorder, Bipolar Disorder, SAD, and ADHD, and 1 Suicide Attempt (cut self with knife, teenager), past Psychiatric Hospitalizations (last- [facility] 2025), and has been to [facility] 06/2022), and no history of Self Injurious Behavior.    He reports things have been ok since his last appointment.  He reports that moving to the apartment has been tough.  He reports that his sleep has had interruptions due to his neighbors making noise.  He reports that he is tired of sitting around doing nothing all day.   He reports that due to this he has been applying to jobs and wants to go back to school.  He reports he has been researching and wants to study Zoology.  Discussed with him that we would not make any changes to his medications at this time and he was agreeable.  He reports no SI, HI, or AVH.  He reports his appetite is fair.  He reports no other concerns at present.  He will return for follow up in approximately 2 months.   Visit Diagnosis:    ICD-10-CM   1. GAD (generalized anxiety disorder)  F41.1 PARoxetine  (PAXIL ) 30 MG tablet    propranolol  (INDERAL ) 10 MG tablet    2. Aggression  R46.89 risperiDONE  (RISPERDAL ) 2 MG tablet    divalproex  (DEPAKOTE  ER) 250 MG 24 hr tablet    3. Alcohol use disorder  F10.90 naltrexone  (DEPADE) 50 MG tablet    4. PTSD (post-traumatic stress disorder)  F43.10 PARoxetine  (PAXIL ) 30 MG tablet    propranolol  (INDERAL ) 10 MG tablet    5. Insomnia, unspecified type  G47.00 traZODone  (DESYREL ) 100 MG tablet               Past Psychiatric History: GAD, OCD, Insomnia, Chronic PTSD, EtOH Use Disorder, Bipolar Disorder, SAD,  and ADHD, and 1 Suicide Attempt (cut self with knife, teenager), past Psychiatric Hospitalizations (last- [facility] 2025), and has been to [facility] 06/2022), and no history of Self Injurious Behavior.   Past Medical History:  Past Medical History:  Diagnosis Date   Alcoholism (HCC)    Anemia    Anxiety    Depression     Past Surgical History:  Procedure Laterality Date   arm tendon surgery      Family Psychiatric History: Father- Cocaine Use No Known Diagnosis' or Suicides  Family History:  Family History  Problem Relation Age of Onset   Colon cancer Neg Hx    Stomach cancer Neg Hx    Esophageal cancer Neg Hx     Social History:  Social History   Socioeconomic History   Marital status: Single    Spouse name: Not on file   Number of children: Not on file   Years of education: Not on file   Highest education level: Some college, no degree  Occupational History   Not on file  Tobacco Use   Smoking status: Former    Types: Cigarettes   Smokeless tobacco: Never  Vaping Use   Vaping status: Never Used  Substance and Sexual Activity   Alcohol use: Not Currently   Drug use: Yes  Types: Marijuana, Oxycodone   Sexual activity: Not Currently  Other Topics Concern   Not on file  Social History Narrative   Not on file   Social Drivers of Health   Financial Resource Strain: Medium Risk (09/08/2023)   Overall Financial Resource Strain (CARDIA)    Difficulty of Paying Living Expenses: Somewhat hard  Food Insecurity: No Food Insecurity (10/24/2023)   Hunger Vital Sign    Worried About Running Out of Food in the Last Year: Never true    Ran Out of Food in the Last Year: Never true  Recent Concern: Food Insecurity - Food Insecurity Present (09/08/2023)   Hunger Vital Sign    Worried About Running Out of Food in the Last Year: Sometimes true    Ran Out of Food in the Last Year: Sometimes true  Transportation Needs: No Transportation Needs (10/24/2023)   PRAPARE -  Administrator, Civil Service (Medical): No    Lack of Transportation (Non-Medical): No  Recent Concern: Transportation Needs - Unmet Transportation Needs (09/08/2023)   PRAPARE - Transportation    Lack of Transportation (Medical): No    Lack of Transportation (Non-Medical): Yes  Physical Activity: Insufficiently Active (09/08/2023)   Exercise Vital Sign    Days of Exercise per Week: 4 days    Minutes of Exercise per Session: 30 min  Stress: Stress Concern Present (09/08/2023)   [employer] of Occupational Health - Occupational Stress Questionnaire    Feeling of Stress: To some extent  Social Connections: Socially Isolated (09/08/2023)   Social Connection and Isolation Panel    Frequency of Communication with Friends and Family: Once a week    Frequency of Social Gatherings with Friends and Family: Once a week    Attends Religious Services: Never    Database administrator or Organizations: No    Attends Engineer, structural: Not on file    Marital Status: Never married    Allergies: No Known Allergies  Metabolic Disorder Labs: Lab Results  Component Value Date   HGBA1C 5.7 (H) 08/19/2023   MPG 116.89 08/19/2023   MPG 120 05/10/2022   No results found for: PROLACTIN Lab Results  Component Value Date   CHOL 170 08/19/2023   TRIG 101 08/19/2023   HDL 51 08/19/2023   CHOLHDL 3.3 08/19/2023   VLDL 20 08/19/2023   LDLCALC 99 08/19/2023   LDLCALC 124 (H) 05/10/2022   Lab Results  Component Value Date   TSH 1.28 09/08/2023   TSH 0.969 08/19/2023    Therapeutic Level Labs: No results found for: LITHIUM No results found for: VALPROATE No results found for: CBMZ  Current Medications: Current Outpatient Medications  Medication Sig Dispense Refill   divalproex  (DEPAKOTE  ER) 250 MG 24 hr tablet Take 3 tablets (750 mg total) by mouth at bedtime. 90 tablet 1   hydrOXYzine  (ATARAX ) 25 MG tablet Take 1 tablet (25 mg total) by mouth 3 (three) times  daily as needed for anxiety. 30 tablet 0   lisinopril  (ZESTRIL ) 20 MG tablet Take 1 tablet (20 mg total) by mouth daily. 90 tablet 1   naltrexone  (DEPADE) 50 MG tablet Take 1 tablet (50 mg total) by mouth daily. 30 tablet 1   PARoxetine  (PAXIL ) 30 MG tablet Take 2 tablets (60 mg total) by mouth daily. 60 tablet 1   propranolol  (INDERAL ) 10 MG tablet Take 3 tablets (30 mg total) by mouth 2 (two) times daily. 180 tablet 1   risperiDONE  (RISPERDAL ) 2 MG  tablet Take 1 mg (0.5 tabs) qAM and 3 mg (1.5 tabs) at night 60 tablet 1   traZODone  (DESYREL ) 100 MG tablet Take 1 tablet (100 mg total) by mouth at bedtime as needed for sleep. 30 tablet 1   No current facility-administered medications for this visit.     Musculoskeletal: Strength & Muscle Tone: within normal limits Gait & Station: normal Patient leans: N/A  Psychiatric Specialty Exam: Review of Systems  Respiratory:  Negative for cough and shortness of breath.   Cardiovascular:  Negative for chest pain.  Gastrointestinal:  Negative for abdominal pain, constipation, diarrhea, nausea and vomiting.  Neurological:  Negative for weakness and headaches.  Psychiatric/Behavioral:  Positive for sleep disturbance (mild- from neighbors). Negative for dysphoric mood and hallucinations. The patient is not nervous/anxious.     Blood pressure 128/88, pulse 77, height 5' 6 (1.676 m), weight 171 lb 12.8 oz (77.9 kg), SpO2 100%.Body mass index is 27.73 kg/m.  General Appearance: Casual and Fairly Groomed  Eye Contact:  Good  Speech:  Clear and Coherent and Normal Rate  Volume:  Normal  Mood:  ok  Affect:  Congruent and Flat  Thought Process:  Coherent and Goal Directed  Orientation:  Full (Time, Place, and Person)  Thought Content: WDL and Logical   Suicidal Thoughts:  No  Homicidal Thoughts:  No  Memory:  Immediate;   Good Recent;   Good  Judgement:  Fair  Insight:  Fair  Psychomotor Activity:  Normal  Concentration:  Concentration: Good  and Attention Span: Good  Recall:  Good  Fund of Knowledge: Good  Language: Good  Akathisia:  Negative  Handed:  Right  AIMS (if indicated): not done  Assets:  Physical Health Resilience  ADL's:  Intact  Cognition: WNL  Sleep:  Fair some impact from neighbors    Screenings: AIMS    Flowsheet Row [facility]  AIMS Total Score 0   AUDIT    Flowsheet Row Ad[facility] ([facility] Counselor from 04/01/2021 in [facility] (Discharged[facility]  Alcohol Use Disorder Identification Test Final Score (AUDIT) 0 13 9   GAD-7    Flowsheet Row Counselor from 11/10/2021 in [facility] Counselor from 09/23/2021 in [facility] Counselor from 09/01/2021 in [facility] Counselor from 08/04/2021 in [facility] Counselor from 07/07/2021 in [facility]  Total GAD-7 Score 8 5 9 6 18    PHQ2-9    Flowsheet Row Office Visit from 10/24/2023 in [facility] [place] Office Visit from 12/16/2022 in [facility] ED from 05/10/2022 in [facility] Counselor from 11/10/2021 in [facility] Counselor from 09/23/2021 in [facility]  PHQ-2 Total Score 1 0 1 0 0  PHQ-9 Total Score -- -- 5 3 5    Flowsheet Row Ad[facility]) [facility] Most recent reading at 08/19/2023 10:00 PM ED from 08/19/2023 in [facility] Most recent reading at 08/19/2023  1:48 PM ED from 05/10/2022 in [facility] Most recent reading at 05/10/2022  5:48 PM  C-SSRS RISK CATEGORY High Risk High  Risk No Risk     Assessment and Plan:  [name] is a 43 yr old male who presents for Follow Up and Medication Management.  PPHx is significant for GAD, OCD, Insomnia, Chronic PTSD, EtOH Use Disorder, Bipolar Disorder, SAD, and ADHD, and 1 Suicide Attempt (cut self with knife, teenager), past Psychiatric Hospitalizations (last- [facility] 2025), and has been to [facility] 06/2022), and no history of Self Injurious Behavior.   [name] has done well since his last appointment.  He has had some issues with moving to an appointment, however, he has been forward thinking and planning to go to college.  We will not make any changes to his medications at this time.  Refills sent in.  He will return for follow up in approximately 2 months.   OCD  GAD  Chronic PTSD  Bipolar Disorder by Hx : -Continue Paxil  60 mg daily for anxiety and OCD.  60 (30 mg) tablets with 1 refill. -Continue Propanolol 30 mg BID for anxiety and HTN. 180 (10 mg) tablets with 1 refill. -Continue Depakote  750 mg daily for mood stability. 90 tablets with 1 refill. -Continue Risperdal  1 mg AM and 3 mg QHS for mood stability. 60 (2 mg) tablets with 1 refill.   EtOH Use Disorder: -Continue Naltrexone  50 mg daily.  30 tablets with 1 refill.   Insomnia: -Continue Trazodone  100 mg QHS PRN.  30 tablets with 0 refills.   Collaboration of Care:   Patient/Guardian was advised Release of Information must be obtained prior to any record release in order to collaborate their care with an outside provider. Patient/Guardian was advised if they have not already done so to contact the registration department to sign all necessary forms in order for us  to release information regarding their care.   Consent: Patient/Guardian gives verbal consent for treatment and assignment of benefits for services provided during this visit. Patient/Guardian  expressed understanding and agreed to proceed.    Marsa GORMAN Rosser, DO 12/20/2023, 2:06 PM

## 2024-03-13 ENCOUNTER — Encounter (HOSPITAL_COMMUNITY): Payer: Self-pay | Admitting: Student in an Organized Health Care Education/Training Program

## 2024-03-13 ENCOUNTER — Ambulatory Visit (INDEPENDENT_AMBULATORY_CARE_PROVIDER_SITE_OTHER): Payer: MEDICAID | Admitting: Student in an Organized Health Care Education/Training Program

## 2024-03-13 DIAGNOSIS — F1090 Alcohol use, unspecified, uncomplicated: Secondary | ICD-10-CM

## 2024-03-13 DIAGNOSIS — F411 Generalized anxiety disorder: Secondary | ICD-10-CM

## 2024-03-13 DIAGNOSIS — F4310 Post-traumatic stress disorder, unspecified: Secondary | ICD-10-CM | POA: Diagnosis not present

## 2024-03-13 DIAGNOSIS — R4689 Other symptoms and signs involving appearance and behavior: Secondary | ICD-10-CM

## 2024-03-13 DIAGNOSIS — G4700 Insomnia, unspecified: Secondary | ICD-10-CM

## 2024-03-13 MED ORDER — PAROXETINE HCL 30 MG PO TABS: 60.0000 mg | ORAL_TABLET | Freq: Every day | ORAL | 1 refills | Status: AC

## 2024-03-13 MED ORDER — NALTREXONE HCL 50 MG PO TABS: 50.0000 mg | ORAL_TABLET | Freq: Every day | ORAL | 1 refills | Status: AC

## 2024-03-13 MED ORDER — RISPERIDONE 2 MG PO TABS: ORAL_TABLET | ORAL | 1 refills | Status: AC

## 2024-03-13 MED ORDER — DIVALPROEX SODIUM ER 250 MG PO TB24: 750.0000 mg | ORAL_TABLET | Freq: Every day | ORAL | 1 refills | Status: AC

## 2024-03-13 MED ORDER — TRAZODONE HCL 100 MG PO TABS: 100.0000 mg | ORAL_TABLET | Freq: Every evening | ORAL | 1 refills | Status: AC | PRN

## 2024-03-13 MED ORDER — MIRTAZAPINE 7.5 MG PO TABS: 7.5000 mg | ORAL_TABLET | Freq: Every day | ORAL | 0 refills | Status: DC

## 2024-03-13 MED ORDER — PROPRANOLOL HCL 10 MG PO TABS: 30.0000 mg | ORAL_TABLET | Freq: Two times a day (BID) | ORAL | 1 refills | Status: AC

## 2024-03-13 NOTE — Progress Notes (Signed)
 [facility] MD/PA/NP OP Progress Note  03/13/2024 1:50 PM [name]  MRN:  [id number]  Chief Complaint:  Chief Complaint  Patient presents with   Follow-up   Depression   Anxiety   HPI:  [name] is a 44 yr old male who presents for Follow Up and Medication Management.  PPHx is significant for GAD, OCD, Insomnia, Chronic PTSD, EtOH Use Disorder, Bipolar Disorder, SAD, and ADHD, and 1 Suicide Attempt (cut self with knife, teenager), past Psychiatric Hospitalizations (last- [facility] 2025), and has been to [facility] 06/2022), and no history of Self Injurious Behavior.    He reports that he has had significant struggles since his last appointment.  He reports that one of his friends completed suicide a few days before Christmas and this was very shocking to him as he did not see any signs of this in her.  He reports that he did not see any much of his family during the holidays because him and his mom are currently fighting.  He reports continued stress from his apartment.  He reports that the people both below and above him continually disrupt him.    He reports that his therapist has been helpful.  He reports that she is also working on getting him connected with a job coach to help him get employed.  He reports that one of his ongoing stressors is still not having his stuff back from when he was hospitalized.  Discussed with him that since it has been 6 months since his hospitalization and he has been doing better he could have his stuff returned to him and he reports that his therapist has also communicated this with his mother.  Discussed with him that since his sleep continues to be an issue we could trial Remeron .  He was agreeable with the trial.  He reports no SI, HI, or AVH.  He reports his sleep has been poor the last few weeks.  He reports his appetite is fair.  He reports no other concerns at present.  He will return for follow-up in approximately 2 months.   Visit Diagnosis:     ICD-10-CM   1. Aggression  R46.89 divalproex  (DEPAKOTE  ER) 250 MG 24 hr tablet    risperiDONE  (RISPERDAL ) 2 MG tablet    2. Alcohol use disorder  F10.90 naltrexone  (DEPADE) 50 MG tablet    3. GAD (generalized anxiety disorder)  F41.1 PARoxetine  (PAXIL ) 30 MG tablet    propranolol  (INDERAL ) 10 MG tablet    mirtazapine  (REMERON ) 7.5 MG tablet    4. PTSD (post-traumatic stress disorder)  F43.10 PARoxetine  (PAXIL ) 30 MG tablet    propranolol  (INDERAL ) 10 MG tablet    mirtazapine  (REMERON ) 7.5 MG tablet    5. Insomnia, unspecified type  G47.00 mirtazapine  (REMERON ) 7.5 MG tablet    traZODone  (DESYREL ) 100 MG tablet                Past Psychiatric History:  GAD, OCD, Insomnia, Chronic PTSD, EtOH Use Disorder, Bipolar Disorder, SAD, and ADHD, and 1 Suicide Attempt (cut self with knife, teenager), past Psychiatric Hospitalizations (last- [facility] 2025), and has been to [facility]/2022), and no history of Self Injurious Behavior.   Past Medical History:  Past Medical History:  Diagnosis Date   Alcoholism (HCC)    Anemia    Anxiety    Depression     Past Surgical History:  Procedure Laterality Date   arm tendon surgery  Family Psychiatric History: Father- Cocaine Use No Known Diagnosis' or Suicides  Family History:  Family History  Problem Relation Age of Onset   Colon cancer Neg Hx    Stomach cancer Neg Hx    Esophageal cancer Neg Hx     Social History:  Social History   Socioeconomic History   Marital status: Single    Spouse name: Not on file   Number of children: Not on file   Years of education: Not on file   Highest education level: Some college, no degree  Occupational History   Not on file  Tobacco Use   Smoking status: Former    Types: Cigarettes   Smokeless tobacco: Never  Vaping Use   Vaping status: Never Used  Substance and Sexual Activity   Alcohol use: Not Currently   Drug use: Yes    Types: Marijuana, Oxycodone    Sexual activity: Not Currently  Other Topics Concern   Not on file  Social History Narrative   Not on file   Social Drivers of Health   Tobacco Use: Medium Risk (10/25/2023)   Patient History    Smoking Tobacco Use: Former    Smokeless Tobacco Use: Never    Passive Exposure: Not on file  Financial Resource Strain: Medium Risk (09/08/2023)   Overall Financial Resource Strain (CARDIA)    Difficulty of Paying Living Expenses: Somewhat hard  Food Insecurity: No Food Insecurity (10/24/2023)   Epic    Worried About Programme Researcher, Broadcasting/film/video in the Last Year: Never true    Ran Out of Food in the Last Year: Never true  Recent Concern: Food Insecurity - Food Insecurity Present (09/08/2023)   Epic    Worried About Programme Researcher, Broadcasting/film/video in the Last Year: Sometimes true    Ran Out of Food in the Last Year: Sometimes true  Transportation Needs: No Transportation Needs (10/24/2023)   Epic    Lack of Transportation (Medical): No    Lack of Transportation (Non-Medical): No  Recent Concern: Transportation Needs - Unmet Transportation Needs (09/08/2023)   Epic    Lack of Transportation (Medical): No    Lack of Transportation (Non-Medical): Yes  Physical Activity: Insufficiently Active (09/08/2023)   Exercise Vital Sign    Days of Exercise per Week: 4 days    Minutes of Exercise per Session: 30 min  Stress: Stress Concern Present (09/08/2023)   [employer] of Occupational Health - Occupational Stress Questionnaire    Feeling of Stress: To some extent  Social Connections: Socially Isolated (09/08/2023)   Social Connection and Isolation Panel    Frequency of Communication with Friends and Family: Once a week    Frequency of Social Gatherings with Friends and Family: Once a week    Attends Religious Services: Never    Database Administrator or Organizations: No    Attends Engineer, Structural: Not on file    Marital Status: Never married  Depression (PHQ2-9): Low Risk (10/24/2023)   Depression  (PHQ2-9)    PHQ-2 Score: 1  Alcohol Screen: Low Risk (08/19/2023)   Alcohol Screen    Last Alcohol Screening Score (AUDIT): 0  Housing: Unknown (10/24/2023)   Epic    Unable to Pay for Housing in the Last Year: No    Number of Times Moved in the Last Year: Not on file    Homeless in the Last Year: No  Utilities: Not At Risk (10/24/2023)   Epic    Threatened with loss of  utilities: No  Health Literacy: Not on file    Allergies: No Known Allergies  Metabolic Disorder Labs: Lab Results  Component Value Date   HGBA1C 5.7 (H) 08/19/2023   MPG 116.89 08/19/2023   MPG 120 05/10/2022   No results found for: PROLACTIN Lab Results  Component Value Date   CHOL 170 08/19/2023   TRIG 101 08/19/2023   HDL 51 08/19/2023   CHOLHDL 3.3 08/19/2023   VLDL 20 08/19/2023   LDLCALC 99 08/19/2023   LDLCALC 124 (H) 05/10/2022   Lab Results  Component Value Date   TSH 1.28 09/08/2023   TSH 0.969 08/19/2023    Therapeutic Level Labs: No results found for: LITHIUM No results found for: VALPROATE No results found for: CBMZ  Current Medications: Current Outpatient Medications  Medication Sig Dispense Refill   mirtazapine  (REMERON ) 7.5 MG tablet Take 1 tablet (7.5 mg total) by mouth at bedtime. 30 tablet 0   divalproex  (DEPAKOTE  ER) 250 MG 24 hr tablet Take 3 tablets (750 mg total) by mouth at bedtime. 90 tablet 1   hydrOXYzine  (ATARAX ) 25 MG tablet Take 1 tablet (25 mg total) by mouth 3 (three) times daily as needed for anxiety. 30 tablet 0   lisinopril  (ZESTRIL ) 20 MG tablet Take 1 tablet (20 mg total) by mouth daily. 90 tablet 1   naltrexone  (DEPADE) 50 MG tablet Take 1 tablet (50 mg total) by mouth daily. 30 tablet 1   PARoxetine  (PAXIL ) 30 MG tablet Take 2 tablets (60 mg total) by mouth daily. 60 tablet 1   propranolol  (INDERAL ) 10 MG tablet Take 3 tablets (30 mg total) by mouth 2 (two) times daily. 180 tablet 1   risperiDONE  (RISPERDAL ) 2 MG tablet Take 1 mg (0.5 tabs) qAM and 3  mg (1.5 tabs) at night 60 tablet 1   traZODone  (DESYREL ) 100 MG tablet Take 1 tablet (100 mg total) by mouth at bedtime as needed for sleep. 30 tablet 1   No current facility-administered medications for this visit.     Musculoskeletal: Strength & Muscle Tone: within normal limits Gait & Station: normal Patient leans: N/A  Psychiatric Specialty Exam: Review of Systems  Respiratory:  Negative for cough and shortness of breath.   Cardiovascular:  Negative for chest pain.  Gastrointestinal:  Negative for abdominal pain, constipation, diarrhea, nausea and vomiting.  Neurological:  Negative for weakness and headaches.  Psychiatric/Behavioral:  Positive for dysphoric mood and sleep disturbance. Negative for hallucinations and suicidal ideas. The patient is not nervous/anxious.     Blood pressure 134/86, pulse (!) 105, height 5' 6 (1.676 m), weight 177 lb 6.4 oz (80.5 kg), SpO2 98%.Body mass index is 28.63 kg/m.  General Appearance: Casual and Fairly Groomed  Eye Contact:  Good  Speech:  Clear and Coherent and Normal Rate  Volume:  Normal  Mood:  Depressed  Affect:  Congruent and Depressed  Thought Process:  Coherent and Goal Directed  Orientation:  Full (Time, Place, and Person)  Thought Content: WDL and Logical   Suicidal Thoughts:  No  Homicidal Thoughts:  No  Memory:  Immediate;   Good Recent;   Good  Judgement:  Fair  Insight:  Fair  Psychomotor Activity:  Normal  Concentration:  Concentration: Good and Attention Span: Good  Recall:  Good  Fund of Knowledge: Good  Language: Good  Akathisia:  Negative  Handed:  Right  AIMS (if indicated): not done  Assets:  Physical Health Resilience  ADL's:  Intact  Cognition: WNL  Sleep:  Poor     Screenings: AIMS    Flowsheet Row [facility]  AIMS Total Score 0   AUDIT    Flowsheet Row [facility] Counselor from 04/01/2021 in [facility] Admission (Discharged[facility]  Alcohol Use Disorder Identification Test Final Score (AUDIT) 0 13 9   GAD-7    Flowsheet Row Counselor from 11/10/2021 in [facility] Counselor from 09/23/2021 in [facility] Counselor from 09/01/2021 in [facility] Counselor from 08/04/2021 in [facility] Counselor from 07/07/2021 in [facility]  Total GAD-7 Score 8 5 9 6 18    PHQ2-9    Flowsheet Row Office Visit from 10/24/2023 in [facility] [place] Office Visit from 12/16/2022 in [facility] [place] ED from 05/10/2022 in [facility] Counselor from 11/10/2021 in [facility] Counselor from 09/23/2021 in [facility]  PHQ-2 Total Score 1 0 1 0 0  PHQ-9 Total Score -- -- 5 3 5    Flowsheet Row Ad[facility]) [facility] Most recent reading at 08/19/2023 10:00 PM ED from 08/19/2023 in [facility] Most recent reading at 08/19/2023  1:48 PM ED from 05/10/2022 in [facility] Most recent reading at 05/10/2022  5:48 PM  C-SSRS RISK CATEGORY High Risk High Risk No Risk     Assessment and Plan:  [name] is a 44 yr old male who presents for Follow Up and Medication Management.  PPHx is significant for GAD, OCD, Insomnia, Chronic PTSD, EtOH Use Disorder, Bipolar Disorder, SAD, and ADHD, and 1 Suicide Attempt (cut self with knife, teenager), past Psychiatric Hospitalizations (last- [facility] 2025), and has been to [facility] 06/2022), and no history of Self  Injurious Behavior.   [name] has had significant multiple stressors since his last appointment which has led to worsening of his depressive symptoms.  Given his sleep continues to be such a significant issue we will start Remeron  to address both the worsening depression and sleep.  We will not make any other changes to his medication at this time.  Refills were sent in.  He will return for follow-up in approximately 2 months.  Have asked him to make an appointment with his PCP for lab- Lipid Panel, A1c, Depakote  Level, CMP, CBC, and EKG.   OCD  GAD  Chronic PTSD  Bipolar Disorder by Hx : -Continue Paxil  60 mg daily for anxiety and OCD.  60 (30 mg) tablets with 1 refill. -Start Remeron  7.5 mg QHS for depression and sleep.  30 tablets with 1 refill. -Continue Propanolol 30 mg BID for anxiety and HTN. 180 (10 mg) tablets with 1 refill. -Continue Depakote  750 mg daily for mood stability. 90 (250 mg) tablets with 1 refill. -Continue Risperdal  1 mg AM and 3 mg QHS for mood stability. 60 (2 mg) tablets with 1 refill.   EtOH Use Disorder: -Continue Naltrexone  50 mg daily.  30 tablets with 1 refill.   Insomnia: -Continue Trazodone  100 mg QHS PRN.  30 tablets with 0 refills.   Collaboration of Care:   Patient/Guardian was advised  Release of Information must be obtained prior to any record release in order to collaborate their care with an outside provider. Patient/Guardian was advised if they have not already done so to contact the registration department to sign all necessary forms in order for us  to release information regarding their care.   Consent: Patient/Guardian gives verbal consent for treatment and assignment of benefits for services provided during this visit. Patient/Guardian expressed understanding and agreed to proceed.    Bradley GORMAN Rosser, DO 03/13/2024, 1:50 PM

## 2024-03-26 ENCOUNTER — Telehealth (HOSPITAL_COMMUNITY): Payer: Self-pay | Admitting: Student in an Organized Health Care Education/Training Program

## 2024-03-27 ENCOUNTER — Encounter (HOSPITAL_COMMUNITY): Payer: Self-pay | Admitting: Student in an Organized Health Care Education/Training Program

## 2024-04-11 ENCOUNTER — Telehealth (HOSPITAL_COMMUNITY): Payer: Self-pay

## 2024-04-11 DIAGNOSIS — G4700 Insomnia, unspecified: Secondary | ICD-10-CM

## 2024-04-11 DIAGNOSIS — F411 Generalized anxiety disorder: Secondary | ICD-10-CM

## 2024-04-11 DIAGNOSIS — F4310 Post-traumatic stress disorder, unspecified: Secondary | ICD-10-CM

## 2024-04-11 MED ORDER — MIRTAZAPINE 7.5 MG PO TABS: 7.5000 mg | ORAL_TABLET | Freq: Every day | ORAL | 1 refills | Status: AC

## 2024-04-11 NOTE — Telephone Encounter (Signed)
 Received message that patient requested refill of his Remeron .  This was sent in. Next appointment scheduled is 3/17.   Sent: Remeron  7.5 mg QHS.  30 tablets with 1 refill.   Marolyn Rosser DO

## 2024-04-11 NOTE — Telephone Encounter (Signed)
 Pt is requesting new refill of Mirtazaphine 7.5 mg tablet to be sent to [facility] at [address] [place], [place] [address]

## 2024-04-13 ENCOUNTER — Other Ambulatory Visit: Payer: Self-pay | Admitting: Family Medicine

## 2024-04-13 DIAGNOSIS — I10 Essential (primary) hypertension: Secondary | ICD-10-CM

## 2024-05-22 ENCOUNTER — Encounter (HOSPITAL_COMMUNITY): Payer: MEDICAID | Admitting: Student in an Organized Health Care Education/Training Program
